# Patient Record
Sex: Female | Born: 1945 | Race: Black or African American | Hispanic: No | Smoking: Former smoker
Health system: Southern US, Community
[De-identification: ages and names within clinical notes are randomized; demographics above are authoritative.]

## PROBLEM LIST (undated history)

## (undated) DIAGNOSIS — R079 Chest pain, unspecified: Secondary | ICD-10-CM

## (undated) DIAGNOSIS — I10 Essential (primary) hypertension: Secondary | ICD-10-CM

## (undated) DIAGNOSIS — M542 Cervicalgia: Secondary | ICD-10-CM

## (undated) DIAGNOSIS — E039 Hypothyroidism, unspecified: Secondary | ICD-10-CM

## (undated) DIAGNOSIS — J189 Pneumonia, unspecified organism: Secondary | ICD-10-CM

## (undated) DIAGNOSIS — T4145XA Adverse effect of unspecified anesthetic, initial encounter: Secondary | ICD-10-CM

## (undated) DIAGNOSIS — R921 Mammographic calcification found on diagnostic imaging of breast: Secondary | ICD-10-CM

## (undated) DIAGNOSIS — J441 Chronic obstructive pulmonary disease with (acute) exacerbation: Secondary | ICD-10-CM

## (undated) DIAGNOSIS — C50919 Malignant neoplasm of unspecified site of unspecified female breast: Secondary | ICD-10-CM

## (undated) DIAGNOSIS — R7303 Prediabetes: Secondary | ICD-10-CM

## (undated) DIAGNOSIS — G709 Myoneural disorder, unspecified: Secondary | ICD-10-CM

## (undated) DIAGNOSIS — R0600 Dyspnea, unspecified: Secondary | ICD-10-CM

## (undated) DIAGNOSIS — J449 Chronic obstructive pulmonary disease, unspecified: Secondary | ICD-10-CM

## (undated) DIAGNOSIS — E559 Vitamin D deficiency, unspecified: Secondary | ICD-10-CM

## (undated) DIAGNOSIS — Z8711 Personal history of peptic ulcer disease: Secondary | ICD-10-CM

## (undated) DIAGNOSIS — J019 Acute sinusitis, unspecified: Secondary | ICD-10-CM

## (undated) DIAGNOSIS — K5730 Diverticulosis of large intestine without perforation or abscess without bleeding: Secondary | ICD-10-CM

## (undated) DIAGNOSIS — M5030 Other cervical disc degeneration, unspecified cervical region: Secondary | ICD-10-CM

## (undated) DIAGNOSIS — L80 Vitiligo: Secondary | ICD-10-CM

## (undated) DIAGNOSIS — G1220 Motor neuron disease, unspecified: Secondary | ICD-10-CM

## (undated) DIAGNOSIS — E785 Hyperlipidemia, unspecified: Secondary | ICD-10-CM

## (undated) DIAGNOSIS — K219 Gastro-esophageal reflux disease without esophagitis: Secondary | ICD-10-CM

## (undated) DIAGNOSIS — K589 Irritable bowel syndrome without diarrhea: Secondary | ICD-10-CM

## (undated) DIAGNOSIS — T8859XA Other complications of anesthesia, initial encounter: Secondary | ICD-10-CM

## (undated) HISTORY — PX: ABDOMINAL HYSTERECTOMY W/ PARTIAL VAGINACTOMY: SUR660

## (undated) HISTORY — DX: Chronic obstructive pulmonary disease, unspecified: J44.9

## (undated) HISTORY — DX: Vitamin D deficiency, unspecified: E55.9

## (undated) HISTORY — DX: Acute sinusitis, unspecified: J01.90

## (undated) HISTORY — DX: Motor neuron disease, unspecified: G12.20

## (undated) HISTORY — DX: Gastro-esophageal reflux disease without esophagitis: K21.9

## (undated) HISTORY — DX: Chronic obstructive pulmonary disease with (acute) exacerbation: J44.1

## (undated) HISTORY — DX: Irritable bowel syndrome, unspecified: K58.9

## (undated) HISTORY — PX: THYROIDECTOMY, PARTIAL: SHX18

## (undated) HISTORY — DX: Essential (primary) hypertension: I10

## (undated) HISTORY — PX: BREAST BIOPSY: SHX20

## (undated) HISTORY — DX: Chest pain, unspecified: R07.9

## (undated) HISTORY — DX: Cervicalgia: M54.2

## (undated) HISTORY — DX: Other cervical disc degeneration, unspecified cervical region: M50.30

## (undated) HISTORY — DX: Myoneural disorder, unspecified: G70.9

## (undated) HISTORY — DX: Personal history of peptic ulcer disease: Z87.11

## (undated) HISTORY — PX: TONSILLECTOMY: SUR1361

## (undated) HISTORY — DX: Vitiligo: L80

## (undated) HISTORY — DX: Diverticulosis of large intestine without perforation or abscess without bleeding: K57.30

## (undated) HISTORY — DX: Hypothyroidism, unspecified: E03.9

## (undated) HISTORY — PX: MULTIPLE TOOTH EXTRACTIONS: SHX2053

## (undated) HISTORY — DX: Hyperlipidemia, unspecified: E78.5

## (undated) HISTORY — DX: Malignant neoplasm of unspecified site of unspecified female breast: C50.919

## (undated) HISTORY — DX: Mammographic calcification found on diagnostic imaging of breast: R92.1

---

## 1996-10-29 HISTORY — PX: BREAST EXCISIONAL BIOPSY: SUR124

## 2000-01-17 ENCOUNTER — Encounter: Admission: RE | Admit: 2000-01-17 | Discharge: 2000-01-17 | Payer: Self-pay | Admitting: Obstetrics and Gynecology

## 2000-01-17 ENCOUNTER — Encounter: Payer: Self-pay | Admitting: Obstetrics and Gynecology

## 2001-01-22 ENCOUNTER — Encounter: Payer: Self-pay | Admitting: Obstetrics and Gynecology

## 2001-01-22 ENCOUNTER — Encounter: Admission: RE | Admit: 2001-01-22 | Discharge: 2001-01-22 | Payer: Self-pay | Admitting: Obstetrics and Gynecology

## 2002-01-08 ENCOUNTER — Encounter: Payer: Self-pay | Admitting: Internal Medicine

## 2002-01-08 ENCOUNTER — Encounter: Admission: RE | Admit: 2002-01-08 | Discharge: 2002-01-08 | Payer: Self-pay | Admitting: Internal Medicine

## 2002-01-26 ENCOUNTER — Ambulatory Visit (HOSPITAL_COMMUNITY): Admission: RE | Admit: 2002-01-26 | Discharge: 2002-01-26 | Payer: Self-pay | Admitting: Internal Medicine

## 2003-03-05 ENCOUNTER — Encounter: Payer: Self-pay | Admitting: Family Medicine

## 2003-03-05 ENCOUNTER — Encounter: Admission: RE | Admit: 2003-03-05 | Discharge: 2003-03-05 | Payer: Self-pay | Admitting: Family Medicine

## 2003-03-11 ENCOUNTER — Encounter: Admission: RE | Admit: 2003-03-11 | Discharge: 2003-03-11 | Payer: Self-pay | Admitting: Family Medicine

## 2003-03-11 ENCOUNTER — Encounter: Payer: Self-pay | Admitting: Family Medicine

## 2003-11-26 ENCOUNTER — Emergency Department (HOSPITAL_COMMUNITY): Admission: AD | Admit: 2003-11-26 | Discharge: 2003-11-26 | Payer: Self-pay | Admitting: Family Medicine

## 2003-12-10 ENCOUNTER — Emergency Department (HOSPITAL_COMMUNITY): Admission: EM | Admit: 2003-12-10 | Discharge: 2003-12-10 | Payer: Self-pay | Admitting: Family Medicine

## 2004-03-21 ENCOUNTER — Encounter: Admission: RE | Admit: 2004-03-21 | Discharge: 2004-03-21 | Payer: Self-pay | Admitting: Family Medicine

## 2004-09-11 ENCOUNTER — Ambulatory Visit: Payer: Self-pay | Admitting: Internal Medicine

## 2004-09-12 ENCOUNTER — Encounter: Admission: RE | Admit: 2004-09-12 | Discharge: 2004-09-12 | Payer: Self-pay | Admitting: Internal Medicine

## 2005-02-28 ENCOUNTER — Other Ambulatory Visit: Admission: RE | Admit: 2005-02-28 | Discharge: 2005-02-28 | Payer: Self-pay | Admitting: Family Medicine

## 2005-02-28 ENCOUNTER — Ambulatory Visit: Payer: Self-pay | Admitting: Family Medicine

## 2005-04-06 ENCOUNTER — Encounter: Admission: RE | Admit: 2005-04-06 | Discharge: 2005-04-06 | Payer: Self-pay | Admitting: Family Medicine

## 2005-06-12 ENCOUNTER — Other Ambulatory Visit: Admission: RE | Admit: 2005-06-12 | Discharge: 2005-06-12 | Payer: Self-pay | Admitting: Family Medicine

## 2005-06-12 ENCOUNTER — Ambulatory Visit: Payer: Self-pay | Admitting: Family Medicine

## 2005-08-09 ENCOUNTER — Other Ambulatory Visit: Admission: RE | Admit: 2005-08-09 | Discharge: 2005-08-09 | Payer: Self-pay | Admitting: Obstetrics and Gynecology

## 2005-11-23 ENCOUNTER — Ambulatory Visit: Payer: Self-pay | Admitting: Family Medicine

## 2006-04-16 ENCOUNTER — Encounter: Admission: RE | Admit: 2006-04-16 | Discharge: 2006-04-16 | Payer: Self-pay | Admitting: Family Medicine

## 2006-04-30 ENCOUNTER — Ambulatory Visit: Payer: Self-pay | Admitting: Family Medicine

## 2006-05-20 ENCOUNTER — Emergency Department (HOSPITAL_COMMUNITY): Admission: EM | Admit: 2006-05-20 | Discharge: 2006-05-20 | Payer: Self-pay | Admitting: Emergency Medicine

## 2006-10-21 ENCOUNTER — Ambulatory Visit: Payer: Self-pay | Admitting: Internal Medicine

## 2006-12-02 ENCOUNTER — Ambulatory Visit: Payer: Self-pay | Admitting: Family Medicine

## 2006-12-02 LAB — CONVERTED CEMR LAB
Alkaline Phosphatase: 61 units/L (ref 39–117)
BUN: 9 mg/dL (ref 6–23)
Basophils Relative: 1.1 % — ABNORMAL HIGH (ref 0.0–1.0)
CO2: 31 meq/L (ref 19–32)
Creatinine, Ser: 0.9 mg/dL (ref 0.4–1.2)
Eosinophils Relative: 0 % (ref 0.0–5.0)
Glucose, Bld: 112 mg/dL — ABNORMAL HIGH (ref 70–99)
HCT: 44.6 % (ref 36.0–46.0)
Hemoglobin: 15.3 g/dL — ABNORMAL HIGH (ref 12.0–15.0)
LDL Cholesterol: 112 mg/dL — ABNORMAL HIGH (ref 0–99)
Monocytes Absolute: 0.5 10*3/uL (ref 0.2–0.7)
Monocytes Relative: 8.6 % (ref 3.0–11.0)
Neutrophils Relative %: 49.2 % (ref 43.0–77.0)
Potassium: 4 meq/L (ref 3.5–5.1)
RDW: 13.3 % (ref 11.5–14.6)
T3, Free: 3.3 pg/mL (ref 2.3–4.2)
Total Bilirubin: 0.6 mg/dL (ref 0.3–1.2)
Total Protein: 7.4 g/dL (ref 6.0–8.3)
VLDL: 15 mg/dL (ref 0–40)
WBC: 5.8 10*3/uL (ref 4.5–10.5)

## 2006-12-30 ENCOUNTER — Ambulatory Visit: Payer: Self-pay | Admitting: Internal Medicine

## 2007-02-05 ENCOUNTER — Ambulatory Visit: Payer: Self-pay | Admitting: Internal Medicine

## 2007-03-17 DIAGNOSIS — G43809 Other migraine, not intractable, without status migrainosus: Secondary | ICD-10-CM | POA: Insufficient documentation

## 2007-03-17 DIAGNOSIS — E039 Hypothyroidism, unspecified: Secondary | ICD-10-CM

## 2007-03-17 DIAGNOSIS — I10 Essential (primary) hypertension: Secondary | ICD-10-CM | POA: Insufficient documentation

## 2007-04-21 ENCOUNTER — Encounter: Admission: RE | Admit: 2007-04-21 | Discharge: 2007-04-21 | Payer: Self-pay | Admitting: Family Medicine

## 2007-04-21 ENCOUNTER — Encounter: Payer: Self-pay | Admitting: Family Medicine

## 2007-04-23 ENCOUNTER — Encounter (INDEPENDENT_AMBULATORY_CARE_PROVIDER_SITE_OTHER): Payer: Self-pay | Admitting: *Deleted

## 2007-04-24 ENCOUNTER — Ambulatory Visit: Payer: Self-pay | Admitting: Family Medicine

## 2007-04-24 ENCOUNTER — Encounter: Admission: RE | Admit: 2007-04-24 | Discharge: 2007-04-24 | Payer: Self-pay | Admitting: Family Medicine

## 2007-04-24 DIAGNOSIS — J441 Chronic obstructive pulmonary disease with (acute) exacerbation: Secondary | ICD-10-CM

## 2007-05-08 ENCOUNTER — Ambulatory Visit: Payer: Self-pay | Admitting: Family Medicine

## 2007-05-20 ENCOUNTER — Encounter: Payer: Self-pay | Admitting: Family Medicine

## 2007-05-22 ENCOUNTER — Ambulatory Visit: Payer: Self-pay | Admitting: Family Medicine

## 2007-05-22 DIAGNOSIS — J449 Chronic obstructive pulmonary disease, unspecified: Secondary | ICD-10-CM

## 2007-06-09 ENCOUNTER — Encounter: Payer: Self-pay | Admitting: Family Medicine

## 2007-06-09 ENCOUNTER — Ambulatory Visit: Payer: Self-pay | Admitting: Internal Medicine

## 2007-06-13 ENCOUNTER — Telehealth (INDEPENDENT_AMBULATORY_CARE_PROVIDER_SITE_OTHER): Payer: Self-pay | Admitting: *Deleted

## 2008-02-09 ENCOUNTER — Ambulatory Visit: Payer: Self-pay | Admitting: Family Medicine

## 2008-02-09 DIAGNOSIS — R079 Chest pain, unspecified: Secondary | ICD-10-CM

## 2008-02-09 DIAGNOSIS — IMO0001 Reserved for inherently not codable concepts without codable children: Secondary | ICD-10-CM | POA: Insufficient documentation

## 2008-02-09 DIAGNOSIS — H9209 Otalgia, unspecified ear: Secondary | ICD-10-CM | POA: Insufficient documentation

## 2008-02-09 DIAGNOSIS — R5383 Other fatigue: Secondary | ICD-10-CM

## 2008-02-09 DIAGNOSIS — R5381 Other malaise: Secondary | ICD-10-CM

## 2008-02-10 ENCOUNTER — Telehealth (INDEPENDENT_AMBULATORY_CARE_PROVIDER_SITE_OTHER): Payer: Self-pay | Admitting: *Deleted

## 2008-02-12 ENCOUNTER — Encounter (INDEPENDENT_AMBULATORY_CARE_PROVIDER_SITE_OTHER): Payer: Self-pay | Admitting: *Deleted

## 2008-02-13 ENCOUNTER — Encounter (INDEPENDENT_AMBULATORY_CARE_PROVIDER_SITE_OTHER): Payer: Self-pay | Admitting: Internal Medicine

## 2008-03-02 ENCOUNTER — Encounter (INDEPENDENT_AMBULATORY_CARE_PROVIDER_SITE_OTHER): Payer: Self-pay | Admitting: *Deleted

## 2008-03-08 ENCOUNTER — Telehealth (INDEPENDENT_AMBULATORY_CARE_PROVIDER_SITE_OTHER): Payer: Self-pay | Admitting: *Deleted

## 2008-03-14 ENCOUNTER — Encounter: Payer: Self-pay | Admitting: Internal Medicine

## 2008-03-24 ENCOUNTER — Ambulatory Visit: Payer: Self-pay | Admitting: Internal Medicine

## 2008-03-24 DIAGNOSIS — J019 Acute sinusitis, unspecified: Secondary | ICD-10-CM | POA: Insufficient documentation

## 2008-03-25 ENCOUNTER — Telehealth (INDEPENDENT_AMBULATORY_CARE_PROVIDER_SITE_OTHER): Payer: Self-pay | Admitting: *Deleted

## 2008-05-17 ENCOUNTER — Telehealth (INDEPENDENT_AMBULATORY_CARE_PROVIDER_SITE_OTHER): Payer: Self-pay | Admitting: *Deleted

## 2008-05-26 ENCOUNTER — Telehealth (INDEPENDENT_AMBULATORY_CARE_PROVIDER_SITE_OTHER): Payer: Self-pay | Admitting: *Deleted

## 2008-05-26 ENCOUNTER — Encounter: Payer: Self-pay | Admitting: Family Medicine

## 2008-05-26 ENCOUNTER — Other Ambulatory Visit: Admission: RE | Admit: 2008-05-26 | Discharge: 2008-05-26 | Payer: Self-pay | Admitting: Family Medicine

## 2008-05-26 ENCOUNTER — Encounter (INDEPENDENT_AMBULATORY_CARE_PROVIDER_SITE_OTHER): Payer: Self-pay | Admitting: *Deleted

## 2008-05-26 ENCOUNTER — Telehealth: Payer: Self-pay | Admitting: Internal Medicine

## 2008-05-26 ENCOUNTER — Ambulatory Visit: Payer: Self-pay | Admitting: Family Medicine

## 2008-05-26 DIAGNOSIS — K219 Gastro-esophageal reflux disease without esophagitis: Secondary | ICD-10-CM | POA: Insufficient documentation

## 2008-05-26 DIAGNOSIS — E559 Vitamin D deficiency, unspecified: Secondary | ICD-10-CM | POA: Insufficient documentation

## 2008-05-26 DIAGNOSIS — Z8711 Personal history of peptic ulcer disease: Secondary | ICD-10-CM

## 2008-05-27 ENCOUNTER — Encounter: Admission: RE | Admit: 2008-05-27 | Discharge: 2008-05-27 | Payer: Self-pay | Admitting: Family Medicine

## 2008-05-27 ENCOUNTER — Encounter (INDEPENDENT_AMBULATORY_CARE_PROVIDER_SITE_OTHER): Payer: Self-pay | Admitting: *Deleted

## 2008-05-27 LAB — CONVERTED CEMR LAB: Vit D, 1,25-Dihydroxy: 16 — ABNORMAL LOW (ref 30–89)

## 2008-05-30 ENCOUNTER — Encounter (INDEPENDENT_AMBULATORY_CARE_PROVIDER_SITE_OTHER): Payer: Self-pay | Admitting: *Deleted

## 2008-05-31 ENCOUNTER — Encounter (INDEPENDENT_AMBULATORY_CARE_PROVIDER_SITE_OTHER): Payer: Self-pay | Admitting: *Deleted

## 2008-06-02 ENCOUNTER — Encounter (INDEPENDENT_AMBULATORY_CARE_PROVIDER_SITE_OTHER): Payer: Self-pay | Admitting: *Deleted

## 2008-06-02 LAB — CONVERTED CEMR LAB
AST: 25 units/L (ref 0–37)
Albumin: 4.4 g/dL (ref 3.5–5.2)
Alkaline Phosphatase: 74 units/L (ref 39–117)
BUN: 6 mg/dL (ref 6–23)
Bilirubin, Direct: 0.1 mg/dL (ref 0.0–0.3)
Chloride: 102 meq/L (ref 96–112)
Eosinophils Absolute: 0 10*3/uL (ref 0.0–0.7)
Eosinophils Relative: 0.1 % (ref 0.0–5.0)
GFR calc non Af Amer: 91 mL/min
HDL: 35.1 mg/dL — ABNORMAL LOW (ref >39.0)
MCV: 96.8 fL (ref 78.0–100.0)
Monocytes Relative: 8.3 % (ref 3.0–12.0)
Neutrophils Relative %: 47.2 % (ref 43.0–77.0)
Platelets: 273 10*3/uL (ref 150–400)
Potassium: 3.9 meq/L (ref 3.5–5.1)
Sodium: 142 meq/L (ref 135–145)
Total CHOL/HDL Ratio: 5
VLDL: 31 mg/dL (ref 0–40)
WBC: 5.8 10*3/uL (ref 4.5–10.5)

## 2008-06-18 ENCOUNTER — Ambulatory Visit: Payer: Self-pay | Admitting: Internal Medicine

## 2008-06-18 DIAGNOSIS — R1084 Generalized abdominal pain: Secondary | ICD-10-CM

## 2008-06-21 ENCOUNTER — Telehealth: Payer: Self-pay | Admitting: Internal Medicine

## 2008-06-25 ENCOUNTER — Telehealth (INDEPENDENT_AMBULATORY_CARE_PROVIDER_SITE_OTHER): Payer: Self-pay | Admitting: *Deleted

## 2008-07-21 ENCOUNTER — Ambulatory Visit: Payer: Self-pay | Admitting: Family Medicine

## 2008-08-31 ENCOUNTER — Encounter (INDEPENDENT_AMBULATORY_CARE_PROVIDER_SITE_OTHER): Payer: Self-pay | Admitting: Internal Medicine

## 2008-08-31 ENCOUNTER — Ambulatory Visit: Payer: Self-pay | Admitting: Nurse Practitioner

## 2008-09-03 ENCOUNTER — Encounter (INDEPENDENT_AMBULATORY_CARE_PROVIDER_SITE_OTHER): Payer: Self-pay | Admitting: Internal Medicine

## 2008-09-05 ENCOUNTER — Encounter (INDEPENDENT_AMBULATORY_CARE_PROVIDER_SITE_OTHER): Payer: Self-pay | Admitting: Family Medicine

## 2008-09-08 ENCOUNTER — Encounter (INDEPENDENT_AMBULATORY_CARE_PROVIDER_SITE_OTHER): Payer: Self-pay | Admitting: Internal Medicine

## 2008-09-16 LAB — CONVERTED CEMR LAB: Vit D, 1,25-Dihydroxy: 31 (ref 30–89)

## 2008-09-28 ENCOUNTER — Encounter (INDEPENDENT_AMBULATORY_CARE_PROVIDER_SITE_OTHER): Payer: Self-pay | Admitting: Internal Medicine

## 2008-09-28 ENCOUNTER — Telehealth (INDEPENDENT_AMBULATORY_CARE_PROVIDER_SITE_OTHER): Payer: Self-pay | Admitting: Internal Medicine

## 2008-10-07 ENCOUNTER — Ambulatory Visit: Payer: Self-pay | Admitting: Internal Medicine

## 2008-10-11 ENCOUNTER — Telehealth (INDEPENDENT_AMBULATORY_CARE_PROVIDER_SITE_OTHER): Payer: Self-pay | Admitting: Internal Medicine

## 2008-10-12 LAB — CONVERTED CEMR LAB: TSH: 1.902 microintl units/mL (ref 0.350–4.50)

## 2008-11-18 ENCOUNTER — Ambulatory Visit: Payer: Self-pay | Admitting: Internal Medicine

## 2008-11-18 DIAGNOSIS — R1313 Dysphagia, pharyngeal phase: Secondary | ICD-10-CM

## 2008-11-18 LAB — CONVERTED CEMR LAB: Rapid Strep: NEGATIVE

## 2008-11-22 ENCOUNTER — Telehealth (INDEPENDENT_AMBULATORY_CARE_PROVIDER_SITE_OTHER): Payer: Self-pay | Admitting: Internal Medicine

## 2008-11-22 ENCOUNTER — Ambulatory Visit: Payer: Self-pay | Admitting: Internal Medicine

## 2008-11-22 LAB — CONVERTED CEMR LAB
CO2: 24 meq/L (ref 19–32)
Calcium: 10.2 mg/dL (ref 8.4–10.5)
Creatinine, Ser: 0.8 mg/dL (ref 0.40–1.20)
Sodium: 143 meq/L (ref 135–145)

## 2008-11-23 ENCOUNTER — Ambulatory Visit (HOSPITAL_COMMUNITY): Admission: RE | Admit: 2008-11-23 | Discharge: 2008-11-23 | Payer: Self-pay | Admitting: Internal Medicine

## 2008-12-06 ENCOUNTER — Encounter (INDEPENDENT_AMBULATORY_CARE_PROVIDER_SITE_OTHER): Payer: Self-pay | Admitting: Internal Medicine

## 2008-12-09 ENCOUNTER — Telehealth (INDEPENDENT_AMBULATORY_CARE_PROVIDER_SITE_OTHER): Payer: Self-pay | Admitting: Internal Medicine

## 2009-01-06 ENCOUNTER — Ambulatory Visit: Payer: Self-pay | Admitting: Internal Medicine

## 2009-01-06 DIAGNOSIS — M542 Cervicalgia: Secondary | ICD-10-CM

## 2009-01-13 ENCOUNTER — Ambulatory Visit (HOSPITAL_COMMUNITY): Admission: RE | Admit: 2009-01-13 | Discharge: 2009-01-13 | Payer: Self-pay | Admitting: Internal Medicine

## 2009-01-16 ENCOUNTER — Encounter (INDEPENDENT_AMBULATORY_CARE_PROVIDER_SITE_OTHER): Payer: Self-pay | Admitting: Internal Medicine

## 2009-01-16 DIAGNOSIS — M5030 Other cervical disc degeneration, unspecified cervical region: Secondary | ICD-10-CM

## 2009-01-18 ENCOUNTER — Telehealth (INDEPENDENT_AMBULATORY_CARE_PROVIDER_SITE_OTHER): Payer: Self-pay | Admitting: Internal Medicine

## 2009-01-25 ENCOUNTER — Telehealth (INDEPENDENT_AMBULATORY_CARE_PROVIDER_SITE_OTHER): Payer: Self-pay | Admitting: Internal Medicine

## 2009-01-27 ENCOUNTER — Encounter (INDEPENDENT_AMBULATORY_CARE_PROVIDER_SITE_OTHER): Payer: Self-pay | Admitting: Internal Medicine

## 2009-02-07 ENCOUNTER — Telehealth (INDEPENDENT_AMBULATORY_CARE_PROVIDER_SITE_OTHER): Payer: Self-pay | Admitting: Internal Medicine

## 2009-02-07 ENCOUNTER — Encounter: Admission: RE | Admit: 2009-02-07 | Discharge: 2009-05-08 | Payer: Self-pay | Admitting: Internal Medicine

## 2009-02-08 ENCOUNTER — Encounter (INDEPENDENT_AMBULATORY_CARE_PROVIDER_SITE_OTHER): Payer: Self-pay | Admitting: Internal Medicine

## 2009-02-11 ENCOUNTER — Encounter (INDEPENDENT_AMBULATORY_CARE_PROVIDER_SITE_OTHER): Payer: Self-pay | Admitting: Internal Medicine

## 2009-02-16 ENCOUNTER — Ambulatory Visit (HOSPITAL_COMMUNITY): Admission: RE | Admit: 2009-02-16 | Discharge: 2009-02-16 | Payer: Self-pay | Admitting: Internal Medicine

## 2009-02-16 ENCOUNTER — Encounter (INDEPENDENT_AMBULATORY_CARE_PROVIDER_SITE_OTHER): Payer: Self-pay | Admitting: Internal Medicine

## 2009-02-17 ENCOUNTER — Encounter (INDEPENDENT_AMBULATORY_CARE_PROVIDER_SITE_OTHER): Payer: Self-pay | Admitting: Internal Medicine

## 2009-02-18 ENCOUNTER — Ambulatory Visit: Payer: Self-pay | Admitting: Internal Medicine

## 2009-02-20 ENCOUNTER — Encounter (INDEPENDENT_AMBULATORY_CARE_PROVIDER_SITE_OTHER): Payer: Self-pay | Admitting: Internal Medicine

## 2009-02-20 LAB — CONVERTED CEMR LAB: TSH: 4.297 microintl units/mL (ref 0.350–4.500)

## 2009-03-14 ENCOUNTER — Encounter (INDEPENDENT_AMBULATORY_CARE_PROVIDER_SITE_OTHER): Payer: Self-pay | Admitting: Internal Medicine

## 2009-04-12 ENCOUNTER — Encounter (INDEPENDENT_AMBULATORY_CARE_PROVIDER_SITE_OTHER): Payer: Self-pay | Admitting: Internal Medicine

## 2009-04-15 ENCOUNTER — Encounter (INDEPENDENT_AMBULATORY_CARE_PROVIDER_SITE_OTHER): Payer: Self-pay | Admitting: Internal Medicine

## 2009-04-19 ENCOUNTER — Ambulatory Visit: Payer: Self-pay | Admitting: Internal Medicine

## 2009-05-18 ENCOUNTER — Ambulatory Visit: Payer: Self-pay | Admitting: Family Medicine

## 2009-05-18 DIAGNOSIS — L80 Vitiligo: Secondary | ICD-10-CM

## 2009-05-23 ENCOUNTER — Telehealth (INDEPENDENT_AMBULATORY_CARE_PROVIDER_SITE_OTHER): Payer: Self-pay | Admitting: Internal Medicine

## 2009-06-10 ENCOUNTER — Telehealth (INDEPENDENT_AMBULATORY_CARE_PROVIDER_SITE_OTHER): Payer: Self-pay | Admitting: Internal Medicine

## 2009-07-15 ENCOUNTER — Ambulatory Visit: Payer: Self-pay | Admitting: Internal Medicine

## 2009-07-15 LAB — CONVERTED CEMR LAB: TSH: 1.461 microintl units/mL (ref 0.350–4.500)

## 2009-07-19 ENCOUNTER — Encounter (INDEPENDENT_AMBULATORY_CARE_PROVIDER_SITE_OTHER): Payer: Self-pay | Admitting: Internal Medicine

## 2009-07-19 ENCOUNTER — Ambulatory Visit: Payer: Self-pay | Admitting: Internal Medicine

## 2009-07-24 ENCOUNTER — Encounter (INDEPENDENT_AMBULATORY_CARE_PROVIDER_SITE_OTHER): Payer: Self-pay | Admitting: Internal Medicine

## 2009-08-15 ENCOUNTER — Telehealth (INDEPENDENT_AMBULATORY_CARE_PROVIDER_SITE_OTHER): Payer: Self-pay | Admitting: Internal Medicine

## 2009-08-19 ENCOUNTER — Telehealth (INDEPENDENT_AMBULATORY_CARE_PROVIDER_SITE_OTHER): Payer: Self-pay | Admitting: Internal Medicine

## 2009-08-19 ENCOUNTER — Ambulatory Visit: Payer: Self-pay | Admitting: Internal Medicine

## 2009-08-19 DIAGNOSIS — J309 Allergic rhinitis, unspecified: Secondary | ICD-10-CM | POA: Insufficient documentation

## 2009-08-19 DIAGNOSIS — E782 Mixed hyperlipidemia: Secondary | ICD-10-CM | POA: Insufficient documentation

## 2009-08-19 LAB — CONVERTED CEMR LAB
ALT: 18 units/L (ref 0–35)
Albumin: 4.6 g/dL (ref 3.5–5.2)
Alkaline Phosphatase: 69 units/L (ref 39–117)
Basophils Absolute: 0 10*3/uL (ref 0.0–0.1)
Basophils Relative: 1 % (ref 0–1)
Bilirubin Urine: NEGATIVE
CO2: 26 meq/L (ref 19–32)
Glucose, Bld: 99 mg/dL (ref 70–99)
Glucose, Urine, Semiquant: NEGATIVE
Hemoglobin: 15 g/dL (ref 12.0–15.0)
LDL Cholesterol: 139 mg/dL — ABNORMAL HIGH (ref 0–99)
Lymphocytes Relative: 51 % — ABNORMAL HIGH (ref 12–46)
Monocytes Absolute: 0.6 10*3/uL (ref 0.1–1.0)
Monocytes Relative: 12 % (ref 3–12)
Neutro Abs: 2 10*3/uL (ref 1.7–7.7)
Neutrophils Relative %: 37 % — ABNORMAL LOW (ref 43–77)
Potassium: 4.8 meq/L (ref 3.5–5.3)
RBC: 4.79 M/uL (ref 3.87–5.11)
Sodium: 142 meq/L (ref 135–145)
Specific Gravity, Urine: 1.015
Total Bilirubin: 0.6 mg/dL (ref 0.3–1.2)
Total Protein: 7.6 g/dL (ref 6.0–8.3)
Triglycerides: 237 mg/dL — ABNORMAL HIGH (ref <150)
VLDL: 47 mg/dL — ABNORMAL HIGH (ref 0–40)
WBC Urine, dipstick: NEGATIVE
pH: 6

## 2009-08-24 ENCOUNTER — Ambulatory Visit (HOSPITAL_COMMUNITY): Admission: RE | Admit: 2009-08-24 | Discharge: 2009-08-24 | Payer: Self-pay | Admitting: Internal Medicine

## 2009-08-31 ENCOUNTER — Encounter (INDEPENDENT_AMBULATORY_CARE_PROVIDER_SITE_OTHER): Payer: Self-pay | Admitting: Internal Medicine

## 2009-09-14 ENCOUNTER — Telehealth (INDEPENDENT_AMBULATORY_CARE_PROVIDER_SITE_OTHER): Payer: Self-pay | Admitting: Internal Medicine

## 2009-09-27 ENCOUNTER — Ambulatory Visit: Payer: Self-pay | Admitting: Internal Medicine

## 2009-09-30 ENCOUNTER — Ambulatory Visit (HOSPITAL_COMMUNITY): Admission: RE | Admit: 2009-09-30 | Discharge: 2009-09-30 | Payer: Self-pay | Admitting: Internal Medicine

## 2009-10-17 ENCOUNTER — Encounter (INDEPENDENT_AMBULATORY_CARE_PROVIDER_SITE_OTHER): Payer: Self-pay | Admitting: Internal Medicine

## 2009-10-17 LAB — CONVERTED CEMR LAB
ALT: 22 units/L (ref 0–35)
Albumin: 4.9 g/dL (ref 3.5–5.2)
Amylase: 78 units/L (ref 0–105)
Basophils Relative: 1 % (ref 0–1)
CO2: 28 meq/L (ref 19–32)
Chloride: 100 meq/L (ref 96–112)
Glucose, Bld: 110 mg/dL — ABNORMAL HIGH (ref 70–99)
Hemoglobin: 14.8 g/dL (ref 12.0–15.0)
Lipase: 26 units/L (ref 0–75)
Lymphocytes Relative: 46 % (ref 12–46)
Lymphs Abs: 2.9 10*3/uL (ref 0.7–4.0)
MCHC: 33.2 g/dL (ref 30.0–36.0)
Monocytes Absolute: 0.6 10*3/uL (ref 0.1–1.0)
Monocytes Relative: 9 % (ref 3–12)
Neutro Abs: 2.9 10*3/uL (ref 1.7–7.7)
Neutrophils Relative %: 45 % (ref 43–77)
Potassium: 4.3 meq/L (ref 3.5–5.3)
RBC: 4.66 M/uL (ref 3.87–5.11)
Sodium: 141 meq/L (ref 135–145)
Total Bilirubin: 0.5 mg/dL (ref 0.3–1.2)
Total Protein: 7.8 g/dL (ref 6.0–8.3)
WBC: 6.4 10*3/uL (ref 4.0–10.5)

## 2009-11-11 ENCOUNTER — Ambulatory Visit: Payer: Self-pay | Admitting: Internal Medicine

## 2009-11-11 DIAGNOSIS — E119 Type 2 diabetes mellitus without complications: Secondary | ICD-10-CM

## 2009-12-22 ENCOUNTER — Ambulatory Visit: Payer: Self-pay | Admitting: Internal Medicine

## 2009-12-22 DIAGNOSIS — R634 Abnormal weight loss: Secondary | ICD-10-CM

## 2009-12-22 LAB — CONVERTED CEMR LAB: Blood Glucose, Fingerstick: 93

## 2009-12-31 ENCOUNTER — Encounter (INDEPENDENT_AMBULATORY_CARE_PROVIDER_SITE_OTHER): Payer: Self-pay | Admitting: Internal Medicine

## 2009-12-31 LAB — CONVERTED CEMR LAB
Albumin: 4.8 g/dL (ref 3.5–5.2)
BUN: 8 mg/dL (ref 6–23)
Calcium: 10.1 mg/dL (ref 8.4–10.5)
Chloride: 104 meq/L (ref 96–112)
Cholesterol: 216 mg/dL — ABNORMAL HIGH (ref 0–200)
Creatinine, Ser: 0.73 mg/dL (ref 0.40–1.20)
Eosinophils Absolute: 0 10*3/uL (ref 0.0–0.7)
Eosinophils Relative: 0 % (ref 0–5)
Glucose, Bld: 109 mg/dL — ABNORMAL HIGH (ref 70–99)
HCT: 45.2 % (ref 36.0–46.0)
HDL: 41 mg/dL (ref >39)
Hemoglobin: 14.3 g/dL (ref 12.0–15.0)
Lymphocytes Relative: 54 % — ABNORMAL HIGH (ref 12–46)
Lymphs Abs: 3 10*3/uL (ref 0.7–4.0)
MCV: 96 fL (ref 78.0–100.0)
Monocytes Absolute: 0.6 10*3/uL (ref 0.1–1.0)
Potassium: 4.5 meq/L (ref 3.5–5.3)
RDW: 13.9 % (ref 11.5–15.5)
Triglycerides: 180 mg/dL — ABNORMAL HIGH (ref <150)

## 2010-02-21 ENCOUNTER — Ambulatory Visit: Payer: Self-pay | Admitting: Internal Medicine

## 2010-02-21 DIAGNOSIS — M79609 Pain in unspecified limb: Secondary | ICD-10-CM | POA: Insufficient documentation

## 2010-02-21 DIAGNOSIS — G609 Hereditary and idiopathic neuropathy, unspecified: Secondary | ICD-10-CM | POA: Insufficient documentation

## 2010-02-21 LAB — CONVERTED CEMR LAB
Blood Glucose, Fingerstick: 96
Hgb A1c MFr Bld: 6.4 %

## 2010-03-10 ENCOUNTER — Ambulatory Visit: Payer: Self-pay | Admitting: Internal Medicine

## 2010-03-20 LAB — CONVERTED CEMR LAB
Free T4: 1.42 ng/dL (ref 0.80–1.80)
TSH: 0.249 microintl units/mL — ABNORMAL LOW (ref 0.350–4.500)
Vitamin B-12: 725 pg/mL (ref 211–911)

## 2010-04-20 ENCOUNTER — Ambulatory Visit: Payer: Self-pay | Admitting: Internal Medicine

## 2010-04-27 LAB — CONVERTED CEMR LAB
Bilirubin, Direct: 0.1 mg/dL (ref 0.0–0.3)
Indirect Bilirubin: 0.4 mg/dL (ref 0.0–0.9)
LDL Cholesterol: 144 mg/dL — ABNORMAL HIGH (ref 0–99)
Total Bilirubin: 0.5 mg/dL (ref 0.3–1.2)
Total Protein: 7.4 g/dL (ref 6.0–8.3)
Triglycerides: 143 mg/dL (ref <150)
VLDL: 29 mg/dL (ref 0–40)

## 2010-06-12 ENCOUNTER — Telehealth (INDEPENDENT_AMBULATORY_CARE_PROVIDER_SITE_OTHER): Payer: Self-pay | Admitting: Internal Medicine

## 2010-06-14 ENCOUNTER — Telehealth (INDEPENDENT_AMBULATORY_CARE_PROVIDER_SITE_OTHER): Payer: Self-pay | Admitting: Internal Medicine

## 2010-06-23 ENCOUNTER — Ambulatory Visit: Payer: Self-pay | Admitting: Internal Medicine

## 2010-06-23 ENCOUNTER — Telehealth (INDEPENDENT_AMBULATORY_CARE_PROVIDER_SITE_OTHER): Payer: Self-pay | Admitting: *Deleted

## 2010-06-26 ENCOUNTER — Ambulatory Visit: Payer: Self-pay | Admitting: Internal Medicine

## 2010-06-26 LAB — CONVERTED CEMR LAB: Total CK: 389 units/L — ABNORMAL HIGH (ref 7–177)

## 2010-07-05 ENCOUNTER — Ambulatory Visit: Payer: Self-pay | Admitting: Internal Medicine

## 2010-07-21 ENCOUNTER — Encounter (HOSPITAL_COMMUNITY)
Admission: RE | Admit: 2010-07-21 | Discharge: 2010-10-02 | Payer: Self-pay | Source: Home / Self Care | Admitting: Cardiology

## 2010-07-25 ENCOUNTER — Encounter (INDEPENDENT_AMBULATORY_CARE_PROVIDER_SITE_OTHER): Payer: Self-pay | Admitting: Internal Medicine

## 2010-08-30 ENCOUNTER — Ambulatory Visit: Payer: Self-pay | Admitting: Internal Medicine

## 2010-09-14 ENCOUNTER — Ambulatory Visit: Payer: Self-pay | Admitting: Internal Medicine

## 2010-09-14 ENCOUNTER — Telehealth (INDEPENDENT_AMBULATORY_CARE_PROVIDER_SITE_OTHER): Payer: Self-pay | Admitting: Internal Medicine

## 2010-09-14 LAB — CONVERTED CEMR LAB
AST: 21 units/L (ref 0–37)
Alkaline Phosphatase: 86 units/L (ref 39–117)
Bilirubin, Direct: 0.2 mg/dL (ref 0.0–0.3)
Total Bilirubin: 0.9 mg/dL (ref 0.3–1.2)

## 2010-09-27 ENCOUNTER — Ambulatory Visit (HOSPITAL_COMMUNITY)
Admission: RE | Admit: 2010-09-27 | Discharge: 2010-09-27 | Payer: Self-pay | Source: Home / Self Care | Admitting: Internal Medicine

## 2010-10-17 ENCOUNTER — Encounter (INDEPENDENT_AMBULATORY_CARE_PROVIDER_SITE_OTHER): Payer: Self-pay | Admitting: Internal Medicine

## 2010-11-01 ENCOUNTER — Ambulatory Visit: Admit: 2010-11-01 | Payer: Self-pay | Admitting: Internal Medicine

## 2010-11-26 LAB — CONVERTED CEMR LAB
AST: 20 units/L (ref 0–37)
Albumin: 4.7 g/dL (ref 3.5–5.2)
Alkaline Phosphatase: 72 units/L (ref 39–117)
BUN: 10 mg/dL (ref 6–23)
Blood Glucose, Fingerstick: 102
CO2: 30 meq/L (ref 19–32)
Chloride: 104 meq/L (ref 96–112)
Creatinine, Ser: 0.9 mg/dL (ref 0.4–1.2)
Eosinophils Relative: 0.2 % (ref 0.0–5.0)
Glucose, Bld: 104 mg/dL — ABNORMAL HIGH (ref 70–99)
HCT: 45.3 % (ref 36.0–46.0)
Hemoglobin: 14.9 g/dL (ref 12.0–15.0)
Hgb A1c MFr Bld: 5.3 %
MCHC: 32.7 g/dL (ref 30.0–36.0)
Monocytes Absolute: 0.3 10*3/uL (ref 0.1–1.0)
Monocytes Relative: 9.9 % (ref 3.0–12.0)
Neutro Abs: 0.2 10*3/uL — ABNORMAL LOW (ref 1.4–7.7)
Potassium: 4.3 meq/L (ref 3.5–5.3)
RBC: 4.49 M/uL (ref 3.87–5.11)
Sodium: 140 meq/L (ref 135–145)
Total Protein: 7.6 g/dL (ref 6.0–8.3)
Vit D, 1,25-Dihydroxy: 16 — ABNORMAL LOW (ref 30–89)
WBC: 3 10*3/uL — ABNORMAL LOW (ref 4.5–10.5)

## 2010-11-28 NOTE — Letter (Signed)
Summary: TEST ORDER FORM//ULTRASOUND//ABDOMINAL PAIN  TEST ORDER FORM//ULTRASOUND//ABDOMINAL PAIN   Imported By: Arta Bruce 11/18/2009 12:05:15  _____________________________________________________________________  External Attachment:    Type:   Image     Comment:   External Document

## 2010-11-28 NOTE — Assessment & Plan Note (Signed)
  Nurse Visit   Allergies: No Known Drug Allergies  Orders Added: 1)  Est. Patient Level I [[address]] 2)  T-Lipid Profile [80061-22930] 3)  T-Hepatic Function [60454-09811]

## 2010-11-28 NOTE — Letter (Signed)
Summary: *Referral Letter  [facility]  [address] [place], [place] [address]   Phone: [phone]  Fax: [phone]    06/23/2010  Thank you in advance for agreeing to see my patient:  Kristin [name] [address] [place], [place]  [address]  Phone: [phone]  Reason for Referral: Atypical chest pain in woman with multiple risk factors including:  Diet controlled DM--recent A1C 5.3%, mild hyperlipidemia, recently not at goal and switched to Lipitor, controlled hypertension, smoker, family history of CAD--mother died age 31 of MI/stroke.  Symptoms at rest only and associated with GI symptoms as well as left arm numbness and dyspnea.  Pt. also with left cervical radiculopathy, hx of PUD and GERD.    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) 7)  HYPERLIPIDEMIA, MIXED (ICD-272.2) 8)  ENCOUNTER FOR LONG-TERM USE OF OTHER MEDICATIONS (ICD-V58.69) 9)  ROUTINE GYNECOLOGICAL EXAMINATION (ICD-V72.31) 10)  ALLERGIC RHINITIS (ICD-477.9) 11)  VITILIGO (ICD-709.01) 12)  DEGENERATIVE DISC DISEASE, CERVICAL SPINE (ICD-722.4) 13)  NECK PAIN, LEFT (ICD-723.1) 14)  DYSPHAGIA PHARYNGEAL PHASE (ICD-787.23) 15)  ABDOMINAL PAIN-GENERALIZED (ICD-789.07) 16)  UNSPECIFIED VITAMIN D DEFICIENCY (ICD-268.9) 17)  PREVENTIVE HEALTH CARE (ICD-V70.0) 18)  GERD (ICD-530.81) 19)  PUD, HX OF (ICD-V12.71) 20)  SINUSITIS, ACUTE (ICD-461.9) 21)  CHEST PAIN UNSPECIFIED (ICD-786.50) 22)  FATIGUE (ICD-780.79) 23)  MYALGIA (ICD-729.1) 24)  EAR PAIN, LEFT (ICD-388.70) 25)  COPD (ICD-496) 26)  BRONCHITIS, OBSTRUCTIVE CHRONIC W/EXACRB (ICD-491.21) 27)  Family Hx of AMI NOS,  UNSPECIFIED (ICD-410.90) 28)  FAMILY HISTORY DIABETES 1ST DEGREE RELATIVE (ICD-V18.0) 29)  OCULAR MIGRAINE (ICD-346.80) 30)  HYPOTHYROIDISM (ICD-244.9) 31)  HYPERTENSION (ICD-401.9)   Current Medications: 1)  HYDROCHLOROTHIAZIDE 25 MG TABS (HYDROCHLOROTHIAZIDE) take one tablet daily 2)  PROVENTIL HFA 108 (90 BASE) MCG/ACT AERS (ALBUTEROL SULFATE) 1-2 puffs every 4-6 hours as needed for shortness of breath 3)  SPIRIVA HANDIHALER 18 MCG CAPS (TIOTROPIUM BROMIDE MONOHYDRATE) 1 inhalation daily 4)  VITAMIN D 400 UNIT TABS (CHOLECALCIFEROL) 2 tabs by mouth daily 5)  SYNTHROID 100 MCG TABS (LEVOTHYROXINE SODIUM) 1 tab by mouth daily 6)  BAYER ASPIRIN 325 MG TABS (ASPIRIN) take one a day as needed 7)  ACCUPRIL 20 MG TABS (QUINAPRIL HCL) 1/2 tab by mouth daily--finishing 5 mg Enalapril first. 8)  NASACORT AQ 55 MCG/ACT AERS (TRIAMCINOLONE ACETONIDE(NASAL)) 2 sprays each nostril daily 9)  RANITIDINE HCL 150 MG TABS (RANITIDINE HCL) 1 tab by mouth two times a day 10)  XYZAL 5 MG TABS (LEVOCETIRIZINE DIHYDROCHLORIDE) 1 tab by mouth daily as needed allergies 11)  LIPITOR 10 MG TABS (ATORVASTATIN CALCIUM) 1 tab by mouth daily   Past Medical History: 1)  VITILIGO (ICD-709.01) 2)  DEGENERATIVE DISC DISEASE, CERVICAL SPINE (ICD-722.4) 3)  NECK PAIN, LEFT (ICD-723.1) 4)  DYSPHAGIA PHARYNGEAL PHASE (ICD-787.23) 5)  ABDOMINAL PAIN-GENERALIZED (ICD-789.07) 6)  UNSPECIFIED VITAMIN D DEFICIENCY (ICD-268.9) 7)  PREVENTIVE HEALTH CARE (ICD-V70.0) 8)  GERD (ICD-530.81) 9)  PUD, HX OF (ICD-V12.71) 10)  SINUSITIS, ACUTE (ICD-461.9) 11)  CHEST PAIN UNSPECIFIED (ICD-786.50) 12)  FATIGUE (ICD-780.79) 13)  MYALGIA (ICD-729.1) 14)  EAR PAIN, LEFT (ICD-388.70) 15)  COPD (ICD-496) 16)  BRONCHITIS, OBSTRUCTIVE CHRONIC W/EXACRB (ICD-491.21) 17)  Family Hx of AMI NOS, UNSPECIFIED (ICD-410.90) 18)  FAMILY HISTORY DIABETES  1ST DEGREE RELATIVE (ICD-V18.0) 19)  OCULAR MIGRAINE (ICD-346.80) 20)  HYPOTHYROIDISM  (ICD-244.9) 21)  HYPERTENSION (ICD-401.9)   Prior History of Blood Transfusions:   Pertinent Labs:    Thank you again for agreeing to see our patient; please contact us if you have any further questions or need additional information.  Sincerely,  Julieanne Manson MD

## 2010-11-28 NOTE — Progress Notes (Signed)
Summary: cardiology referral   Phone Note Outgoing Call   Summary of Call: Arna Medici:  cardiology referral for atypical chest pain--see letter, EKG, office note and order. Initial call taken by: Julieanne Manson MD,  June 23, 2010 9:38 AM  Follow-up for Phone Call        PT HAVE AN APPT 06-28-10 @ 1;30PM ARRIVAL @ 1PM [facility]  Follow-up by: Cheryll Dessert,  June 26, 2010 9:54 AM

## 2010-11-28 NOTE — Letter (Signed)
Summary: Lipid Letter  [facility]  [address] [place], [place] [address]   Phone: [phone]  Fax: [phone]    12/31/2009  Kristin [name] [address] [place], [place]  [address]  Dear [name]:  We have carefully reviewed your last lipid profile from 12/22/2009 and the results are noted below with a summary of recommendations for lipid management.    Cholesterol:       216     Goal: <200   HDL "good" Cholesterol:   41     Goal: >45   LDL "bad" Cholesterol:   139     Goal: <70   Triglycerides:       180     Goal: <150    Your cholesterol is not at goal for someone with diabetes.   When you follow up in about 1 1/2 months, will need to discuss whether you should start on meds for cholesterol and diabetes.  I will need to discuss your risks and benefits of medication--especially with your liver disease (hepatitis)  You do still make some insulin, so we can use pills to treat Diabetes.      TLC Diet (Therapeutic Lifestyle Change): Saturated Fats & Transfatty acids should be kept < 7% of total calories ***Reduce Saturated Fats Polyunstaurated Fat can be up to 10% of total calories Monounsaturated Fat Fat can be up to 20% of total calories Total Fat should be no greater than 25-35% of total calories Carbohydrates should be 50-60% of total calories Protein should be approximately 15% of total calories Fiber should be at least 20-30 grams a day ***Increased fiber may help lower LDL Total Cholesterol should be < 200mg /day Consider adding plant stanol/sterols to diet (example: Benacol spread) ***A higher intake of unsaturated fat may reduce Triglycerides and Increase HDL    Adjunctive Measures (may lower LIPIDS and reduce risk of Heart Attack) include: Aerobic Exercise (20-30 minutes 3-4 times a week) Limit Alcohol Consumption Weight Reduction Aspirin 75-81 mg a day by mouth (if not allergic or contraindicated) Dietary Fiber 20-30 grams a day by  mouth     Current Medications: 1)    Hydrochlorothiazide 25 Mg Tabs (Hydrochlorothiazide) .... Take one tablet daily 2)    Proventil Hfa 108 (90 Base) Mcg/act Aers (Albuterol sulfate) .Marland Kitchen.. 1-2 puffs every 4-6 hours as needed for shortness of breath 3)    Spiriva Handihaler 18 Mcg Caps (Tiotropium bromide monohydrate) .Marland Kitchen.. 1 inhalation daily 4)    Vitamin D 400 Unit Tabs (Cholecalciferol) .... 2 tabs by mouth daily 5)    Synthroid 100 Mcg Tabs (Levothyroxine sodium) .Marland Kitchen.. 1 tab by mouth daily 6)    Bayer Aspirin 325 Mg Tabs (Aspirin) .... Take one a day as needed 7)    Accupril 20 Mg Tabs (Quinapril hcl) .... 1/2 tab by mouth daily--finishing 5 mg enalapril first. 8)    Nasacort Aq 55 Mcg/act Aers (Triamcinolone acetonide(nasal)) .... 2 sprays each nostril daily 9)    Allegra 180 Mg Tabs (Fexofenadine hcl) .Marland Kitchen.. 1 tab by mouth daily as needed allergies 10)    Ranitidine Hcl 150 Mg Tabs (Ranitidine hcl) .Marland Kitchen.. 1 tab by mouth two times a day  If you have any questions, please call. We appreciate being able to work with you.   Sincerely,    [facility] Julieanne Manson MD

## 2010-11-28 NOTE — Assessment & Plan Note (Signed)
Summary: 2 MO F/U & FLU SHOT PER [name]///RJE   Vital Signs:  Patient profile:   65 year old female Menstrual status:  hysterectomy Height:      66 inches Weight:      136 pounds BMI:     22.03 Temp:     97.0 degrees F oral Pulse rate:   86 / minute Pulse rhythm:   regular Resp:     18 per minute BP sitting:   110 / 60  (left arm) Cuff size:   regular  Vitals Entered By: [place] Shannon (August 30, 2010 10:04 AM) CC: two month f/u... pt needs refill on hctz...  Does patient need assistance? Functional Status Self care Ambulation Normal   Primary Care Izan Miron:  [place]  CC:  two month f/u... pt needs refill on hctz....  History of Present Illness: 1.  Chest discomfort with elevated CPK MB.  Was seen by Dr. Anne Fu, [facility].  Underwent nuclear stress testing which showed an EF of 77% and low risk for ischemia.  No further problems with chest discomfort.  2.  Hyperlipidemia:  Has tolerated Lipitor fine.  Nonfasting today, however.  3.  DM:  Did get her Retasure--have not yet seen the report.  Needs flu vaccine today.  Not checking sugars.  Last A1C was quite good.  4.  Health Maintenance:  Needs Mammogram set up.  Current Medications (verified): 1)  Hydrochlorothiazide 25 Mg Tabs (Hydrochlorothiazide) .... Take One Tablet Daily 2)  Proventil Hfa 108 (90 Base) Mcg/act Aers (Albuterol Sulfate) .Marland Kitchen.. 1-2 Puffs Every 4-6 Hours As Needed For Shortness of Breath 3)  Spiriva Handihaler 18 Mcg Caps (Tiotropium Bromide Monohydrate) .Marland Kitchen.. 1 Inhalation Daily 4)  Vitamin D 400 Unit Tabs (Cholecalciferol) .... 2 Tabs By Mouth Daily 5)  Synthroid 100 Mcg Tabs (Levothyroxine Sodium) .Marland Kitchen.. 1 Tab By Mouth Daily 6)  Bayer Aspirin 325 Mg Tabs (Aspirin) .... Take One A Day As Needed 7)  Accupril 20 Mg Tabs (Quinapril Hcl) .... 1/2 Tab By Mouth Daily--Finishing 5 Mg Enalapril First. 8)  Nasacort Aq 55 Mcg/act Aers (Triamcinolone Acetonide(Nasal)) .... 2 Sprays Each Nostril Daily 9)   Famotidine 40 Mg Tabs (Famotidine) .Marland Kitchen.. 1 Tab By Mouth Daily At Bedtime 10)  Xyzal 5 Mg Tabs (Levocetirizine Dihydrochloride) .Marland Kitchen.. 1 Tab By Mouth Daily As Needed Allergies 11)  Lipitor 10 Mg Tabs (Atorvastatin Calcium) .Marland Kitchen.. 1 Tab By Mouth Daily  Allergies (verified): No Known Drug Allergies  Physical Exam  General:  NAD Lungs:  Normal respiratory effort, chest expands symmetrically. Lungs are clear to auscultation, no crackles or wheezes. Heart:  Normal rate and regular rhythm. S1 and S2 normal without gallop, murmur, click, rub or other extra sounds.  Radial pulses normal and equal   Impression & Recommendations:  Problem # 1:  CHEST PAIN UNSPECIFIED (ICD-786.50) Resolved. Cardiolite was negative for ischemia  Problem # 2:  DIABETES MELLITUS, TYPE II, CONTROLLED (ICD-250.00) Flu vaccine today Check on Retasure Her updated medication list for this problem includes:    Bayer Aspirin 325 Mg Tabs (Aspirin) .Marland Kitchen... Take one a day as needed    Accupril 20 Mg Tabs (Quinapril hcl) .Marland Kitchen... 1/2 tab by mouth daily--finishing 5 mg enalapril first.  Problem # 3:  HYPERLIPIDEMIA, MIXED (ICD-272.2) Return for check of cholesterol on Lipitor Her updated medication list for this problem includes:    Lipitor 10 Mg Tabs (Atorvastatin calcium) .Marland Kitchen... 1 tab by mouth daily  Problem # 4:  PREVENTIVE HEALTH CARE (ICD-V70.0) Flu vaccine  Orders: Mammogram (Screening) (Mammo)  Complete Medication List: 1)  Hydrochlorothiazide 25 Mg Tabs (Hydrochlorothiazide) .... Take one tablet daily 2)  Proventil Hfa 108 (90 Base) Mcg/act Aers (Albuterol sulfate) .Marland Kitchen.. 1-2 puffs every 4-6 hours as needed for shortness of breath 3)  Spiriva Handihaler 18 Mcg Caps (Tiotropium bromide monohydrate) .Marland Kitchen.. 1 inhalation daily 4)  Vitamin D 400 Unit Tabs (Cholecalciferol) .... 2 tabs by mouth daily 5)  Synthroid 100 Mcg Tabs (Levothyroxine sodium) .Marland Kitchen.. 1 tab by mouth daily 6)  Bayer Aspirin 325 Mg Tabs (Aspirin) .... Take one a  day as needed 7)  Accupril 20 Mg Tabs (Quinapril hcl) .... 1/2 tab by mouth daily--finishing 5 mg enalapril first. 8)  Nasacort Aq 55 Mcg/act Aers (Triamcinolone acetonide(nasal)) .... 2 sprays each nostril daily 9)  Famotidine 40 Mg Tabs (Famotidine) .Marland Kitchen.. 1 tab by mouth daily at bedtime 10)  Xyzal 5 Mg Tabs (Levocetirizine dihydrochloride) .Marland Kitchen.. 1 tab by mouth daily as needed allergies 11)  Lipitor 10 Mg Tabs (Atorvastatin calcium) .Marland Kitchen.. 1 tab by mouth daily  Other Orders: Flu Vaccine 7yrs + ([address]) Admin 1st Vaccine (95621)  Patient Instructions: 1)  Follow up with Dr. Delrae Alfred in 6 months --CPP 2)  fasting lab visit in next 2 weeks --FLP, liver profile Prescriptions: HYDROCHLOROTHIAZIDE 25 MG TABS (HYDROCHLOROTHIAZIDE) take one tablet daily  #90 x 11   Entered and Authorized by:   Julieanne Manson MD   Signed by:   Julieanne Manson MD on 08/30/2010   Method used:   Faxed to ...       [facility] (retail)       [address] [place], [place]  [address]       Ph: [phone]       Fax: [phone]   RxID:   [id number]    Orders Added: 1)  Est. Patient Level III [99[address]] 2)  Flu Vaccine 56yrs + [[address]] 3)  Admin 1st Vaccine [90471] 4)  Mammogram (Screening) [Mammo]   Immunizations Administered:  Influenza Vaccine # 1:    Vaccine Type: Fluvax 3+    Site: left deltoid    Mfr: GlaxoSmithKline    Dose: 0.5 ml    Route: IM    Given by: Armenia Shannon    Exp. Date: 04/28/2011    Lot #: [id number]    VIS given: 05/23/10 version given August 30, 2010.  Flu Vaccine Consent Questions:    Do you have a history of severe allergic reactions to this vaccine? no    Any prior history of allergic reactions to egg and/or gelatin? no    Do you have a sensitivity to the preservative Thimersol? no    Do you have a past history of Guillan-Barre Syndrome? no    Do you currently have an acute febrile illness? no    Have you ever had a  severe reaction to latex? no    Vaccine information given and explained to patient? yes    Are you currently pregnant? no   Immunizations Administered:  Influenza Vaccine # 1:    Vaccine Type: Fluvax 3+    Site: left deltoid    Mfr: GlaxoSmithKline    Dose: 0.5 ml    Route: IM    Given by: Armenia Shannon    Exp. Date: 04/28/2011    Lot #: [id number]    VIS given: 05/23/10 version given August 30, 2010.

## 2010-11-28 NOTE — Assessment & Plan Note (Signed)
Summary: follow up with Dr Delrae Alfred in 6 months - hypertension/ COPD//GK   Vital Signs:  Patient profile:   65 year old female Menstrual status:  hysterectomy Weight:      126.38 pounds BMI:     20.47 O2 Sat:      97 % Temp:     97.8 degrees F Pulse rate:   106 / minute Pulse rhythm:   regular Resp:     22 per minute BP sitting:   181 / 97  (left arm) Cuff size:   regular  Vitals Entered By: Chauncy Passy, SMA CC: Pt. is here for a f/u on her HTN and COPD. Pt. states she has been feeling weak w/ little energy for the last past couple of months.  Is Patient Diabetic? Yes Did you bring your meter with you today? No Pain Assessment Patient in pain? no      CBG Result 96  Does patient need assistance? Functional Status Self care Ambulation Normal Comments Peak Flow: 130 - 200 - 190   Primary Care Provider:  Laury Axon  CC:  Pt. is here for a f/u on her HTN and COPD. Pt. states she has been feeling weak w/ little energy for the last past couple of months. .  History of Present Illness: 1.  Received a note stating she has hepatitis--discussed that was incorrect, not sure why I typed that in note--had a history of hepatic cysts that have been stable.  2.  Bilateral feet feel strange--tingling.  No low back pain.  No radicular symptoms.  Has been a gradual onset.  Hands get numb, but only if hands are cold.  Does not note a color change with them.  Feet also are only intermittently tingling--sounds like if she wears slippers, no symtoms.  Only arch of fee are involved.  Occurs with any shoe.    3.  DM:  A1C:  remains at 6.4%  Discussed whether should start meds or not--giving pancreas a "rest".  Pt. reluctant to start another medication, particularly with her current diet control success.  Has not had an eye check for 3 years.  4.  Hyperlipidemia:  feels she is doing everything she can with diet and exercise--the latter limited by her current fatigue.  5.  Dyphagia:  Better with  Ranitidine--did not start back up with diarrhea when restarted med.  Did also start back on allergy pill--again, this symptom is better.  Eating more solids now as well.  Now feels early satiety after eating just a couple of bites.  Points to her periumbilical area.  If passes flatus or has a bm, sense of fullness resolves and can eat some more.  Stool are formed and without melena or hematochezia.  Weight has remained stable.    6.  Fatigue:  Can hardly get out of bed in morning.  Labs were fine 2 months ago.  Feels like she sleeps well.  Goes to bed at 10 pm and is up at 7 a.m.  Does not feel rested.  Will often lie in bed for a while after as so tired.  Still looking for a job and stressed out.  7.  Hypertension:  stopped HCTZ with all of her symptoms last visit--bp up now.  Urinary frequency did not change with disontinuation.    Current Medications (verified): 1)  Hydrochlorothiazide 25 Mg Tabs (Hydrochlorothiazide) .... Take One Tablet Daily 2)  Proventil Hfa 108 (90 Base) Mcg/act Aers (Albuterol Sulfate) .Marland Kitchen.. 1-2 Puffs Every 4-6  Hours As Needed For Shortness of Breath 3)  Spiriva Handihaler 18 Mcg Caps (Tiotropium Bromide Monohydrate) .Marland Kitchen.. 1 Inhalation Daily 4)  Vitamin D 400 Unit Tabs (Cholecalciferol) .... 2 Tabs By Mouth Daily 5)  Synthroid 100 Mcg Tabs (Levothyroxine Sodium) .Marland Kitchen.. 1 Tab By Mouth Daily 6)  Bayer Aspirin 325 Mg Tabs (Aspirin) .... Take One A Day As Needed 7)  Accupril 20 Mg Tabs (Quinapril Hcl) .... 1/2 Tab By Mouth Daily--Finishing 5 Mg Enalapril First. 8)  Nasacort Aq 55 Mcg/act Aers (Triamcinolone Acetonide(Nasal)) .... 2 Sprays Each Nostril Daily 9)  Ranitidine Hcl 150 Mg Tabs (Ranitidine Hcl) .Marland Kitchen.. 1 Tab By Mouth Two Times A Day 10)  Xyzal 5 Mg Tabs (Levocetirizine Dihydrochloride) .Marland Kitchen.. 1 Tab By Mouth Daily As Needed Allergies  Allergies (verified): No Known Drug Allergies  Physical Exam  General:  NAD, appears anxious at times. Mouth:  pharynx pink and moist.     Neck:  No deformities, masses, or tenderness noted. Lungs:  Normal respiratory effort, chest expands symmetrically. Lungs are clear to auscultation, no crackles or wheezes. Heart:  Normal rate and regular rhythm. S1 and S2 normal without gallop, murmur, click, rub or other extra sounds.  Radial pulses normal and equal. Abdomen:  Bowel sounds positive,abdomen soft and non-tender without masses, organomegaly or hernias noted.   Impression & Recommendations:  Problem # 1:  DIABETES MELLITUS, TYPE II, CONTROLLED (ICD-250.00) Pt. elects to hold on medication--will think about starting Metformin Her updated medication list for this problem includes:    Bayer Aspirin 325 Mg Tabs (Aspirin) .Marland Kitchen... Take one a day as needed    Accupril 20 Mg Tabs (Quinapril hcl) .Marland Kitchen... 1/2 tab by mouth daily--finishing 5 mg enalapril first.  Orders: Capillary Blood Glucose/CBG (82948) T- Hemoglobin A1C (42595-63875) T-Urine Microalbumin w/creat. ratio [phone])  Problem # 2:  FOOT PAIN, BILATERAL (ICD-729.5)  Orders: Podiatry Referral (Podiatry)  Problem # 3:  HYPERLIPIDEMIA, MIXED (ICD-272.2) Start Simvastatin. Her updated medication list for this problem includes:    Simvastatin 20 Mg Tabs (Simvastatin) .Marland Kitchen... 1 tab by mouth daily  Problem # 4:  DYSPHAGIA PHARYNGEAL PHASE (AYT-016.01) IMproved with treatement of GERD and allergies. Maintaining weight.  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 Orders: T-TSH [phone]) T-T4, Free [phone])  Problem # 6:  HYPERTENSION (ICD-401.9) REstart HCTZ Her updated medication list for this problem includes:    Hydrochlorothiazide 25 Mg Tabs (Hydrochlorothiazide) .Marland Kitchen... Take one tablet daily    Accupril 20 Mg Tabs (Quinapril hcl) .Marland Kitchen... 1/2 tab by mouth daily--finishing 5 mg enalapril  first.  Problem # 7:  PERIPHERAL NEUROPATHY (ICD-356.9) Not clear if this is truly a peripheral neuropathy-very focal symptoms on feet. Orders: T-Vitamin B12 ([id number]) T-Folic Acid; RBC ([id number])  Complete Medication List: 1)  Hydrochlorothiazide 25 Mg Tabs (Hydrochlorothiazide) .... Take one tablet daily 2)  Proventil Hfa 108 (90 Base) Mcg/act Aers (Albuterol sulfate) .Marland Kitchen.. 1-2 puffs every 4-6 hours as needed for shortness of breath 3)  Spiriva Handihaler 18 Mcg Caps (Tiotropium bromide monohydrate) .Marland Kitchen.. 1 inhalation daily 4)  Vitamin D 400 Unit Tabs (Cholecalciferol) .... 2 tabs by mouth daily 5)  Synthroid 100 Mcg Tabs (Levothyroxine sodium) .Marland Kitchen.. 1 tab by mouth daily 6)  Bayer Aspirin 325 Mg Tabs (Aspirin) .... Take one a day as needed 7)  Accupril 20 Mg Tabs (Quinapril hcl) .... 1/2 tab by  mouth daily--finishing 5 mg enalapril first. 8)  Nasacort Aq 55 Mcg/act Aers (Triamcinolone acetonide(nasal)) .... 2 sprays each nostril daily 9)  Ranitidine Hcl 150 Mg Tabs (Ranitidine hcl) .Marland Kitchen.. 1 tab by mouth two times a day 10)  Xyzal 5 Mg Tabs (Levocetirizine dihydrochloride) .Marland Kitchen.. 1 tab by mouth daily as needed allergies 11)  Simvastatin 20 Mg Tabs (Simvastatin) .Marland Kitchen.. 1 tab by mouth daily  Patient Instructions: 1)  Nurse visit for bp check in 2 weeks. 2)  Follow up with Dr. Delrae Alfred in 4 months --DM, htn, fatigue. 3)  Lab visit for fasting labs in 8 weeks:  FLP, liver profile Prescriptions: SIMVASTATIN 20 MG TABS (SIMVASTATIN) 1 tab by mouth daily  #30 x 11   Entered and Authorized by:   Julieanne Manson MD   Signed by:   Julieanne Manson MD on 02/21/2010   Method used:   Print then Give to Patient   RxID:   [id number]   Laboratory Results   Blood Tests     HGBA1C: 6.4%   (Normal Range: Non-Diabetic - 3-6%   Control Diabetic - 6-8%) CBG Random:: 96mg /dL     Appended Document: follow up with Dr Delrae Alfred in 6 months - hypertension/ COPD//GK  Laboratory Results     Urine Tests    Routine Urinalysis   Glucose: negative   (Normal Range: Negative) Bilirubin: negative   (Normal Range: Negative) Ketone: negative   (Normal Range: Negative) Spec. Gravity: 1.025   (Normal Range: 1.003-1.035) Blood: trace-intact   (Normal Range: Negative) pH: 6.0   (Normal Range: 5.0-8.0) Protein: negative   (Normal Range: Negative) Urobilinogen: 0.2   (Normal Range: 0-1) Nitrite: negative   (Normal Range: Negative) Leukocyte Esterace: negative   (Normal Range: Negative)

## 2010-11-28 NOTE — Letter (Signed)
Summary: [facility]   Imported By: Arta Bruce 09/28/2010 11:25:54  _____________________________________________________________________  External Attachment:    Type:   Image     Comment:   External Document

## 2010-11-28 NOTE — Assessment & Plan Note (Signed)
Summary: follow up visit in 1 month//gk   Vital Signs:  Patient profile:   65 year old female Menstrual status:  hysterectomy Weight:      126 pounds Temp:     97.7 degrees F Pulse rate:   108 / minute Pulse rhythm:   regular Resp:     20 per minute BP sitting:   84 / 60  (left arm) Cuff size:   regular  Vitals Entered By: Vesta Mixer CMA (December 22, 2009 8:19 AM) CC: f/u on problems she was having with her throat and stomach pt is fasting today. Is Patient Diabetic? Yes Pain Assessment Patient in pain? yes     Location: all over CBG Result 93  Does patient need assistance? Ambulation Normal   Primary Care Provider:  Laury Axon  CC:  f/u on problems she was having with her throat and stomach pt is fasting today.Marland Kitchen  History of Present Illness: 1.  Dysphagia/neck and facial pain:  Took Amoxicillin and headaches, nasal bleeding improved.  Not clear how she took the Amoxicillin--initially states had 30 day supply instead of 10.  Does state she took the med two times a day until it was done and did not miss or stop.  Had a nasal bleed again 3 days ago--left nostril.  Pt. feels she is congested again on left side -- feels cold all over.  This is the first day since taking the antibiotics that she has felt this way.  Is not using the Nasocort.  Not taking Allegra as well.  States she is also performing the PT exercises we discussed at last visit.  Pt. is eating soups, yogurt, ice cream, mashed potatoes.    Stopped the Ranitidine after 1 week, but developed diarrhea, which she attributed to the Ranitidine and stopped--was taking Amoxicillin at the same time, however.  Just generally, does not feel well.   2.  DM:  Drinking a lot of water and urinating a lot.  Feels thirsty.  Allergies (verified): No Known Drug Allergies  Physical Exam  General:  NAD Head:  NT over frontal and maxillary sinuses Eyes:  No corneal or conjunctival inflammation noted. EOMI. Perrla. Funduscopic  exam benign, without hemorrhages, exudates or papilledema. Vision grossly normal. Ears:  TMs a bit dull Nose:  old blood/clot in left nostril.  Right nostril clear. Mouth:  pharynx pink and moist.   Neck:  No deformities, masses, or tenderness noted.no masses, no thyromegaly, and no thyroid nodules or tenderness.   Lungs:  Normal respiratory effort, chest expands symmetrically. Lungs are clear to auscultation, no crackles or wheezes. Heart:  Normal rate and regular rhythm. S1 and S2 normal without gallop, murmur, click, rub or other extra sounds.  Radial pulses normal and equal Abdomen:  Bowel sounds positive,abdomen soft and non-tender without masses, organomegaly or hernias noted.   Impression & Recommendations:  Problem # 1:  DYSPHAGIA PHARYNGEAL PHASE (ZOX-096.04) Neck pain as well Some improvement with treatment of what was felt to be sinusitis. Both chronic problems that have been evaluated significantly in the past--CT of neck and sinuses, swallowing studies, ENT and GI evaluation and treatment,  PT evaluation and treatment.  Feel her symptoms are multifactorial--muscular neck pain, allergies, GERD. Encouraged using Allegra Hold Nasocort with nosebleed. Surgilube or other water based lubricant for nose Nasal saline. To restart Ranitidine and see if diarrhea recurs--may have been from antibiotic  Problem # 2:  DIABETES MELLITUS, TYPE II, CONTROLLED (ICD-250.00) Discussed possible start of Metformin, but with her  dysphagia and recent go around with diarrhea, may need to wait until the former is doing better--only 1/2 lb weight loss since last visit. Her updated medication list for this problem includes:    Bayer Aspirin 325 Mg Tabs (Aspirin) .Marland Kitchen... Take one a day as needed    Accupril 20 Mg Tabs (Quinapril hcl) .Marland Kitchen... 1/2 tab by mouth daily--finishing 5 mg enalapril first.  Orders: Capillary Blood Glucose/CBG ([address])  Problem # 3:  WEIGHT LOSS, ABNORMAL  (ICD-783.21)  Orders: T-Comprehensive Metabolic Panel ([id number]) T-CBC w/Diff ([id number]) T-TSH ([id number]) T-T4, Free ([id number])  Complete Medication List: 1)  Hydrochlorothiazide 25 Mg Tabs (Hydrochlorothiazide) .... Take one tablet daily 2)  Proventil Hfa 108 (90 Base) Mcg/act Aers (Albuterol sulfate) .Marland Kitchen.. 1-2 puffs every 4-6 hours as needed for shortness of breath 3)  Spiriva Handihaler 18 Mcg Caps (Tiotropium bromide monohydrate) .Marland Kitchen.. 1 inhalation daily 4)  Vitamin D 400 Unit Tabs (Cholecalciferol) .... 2 tabs by mouth daily 5)  Synthroid 100 Mcg Tabs (Levothyroxine sodium) .Marland Kitchen.. 1 tab by mouth daily 6)  Bayer Aspirin 325 Mg Tabs (Aspirin) .... Take one a day as needed 7)  Accupril 20 Mg Tabs (Quinapril hcl) .... 1/2 tab by mouth daily--finishing 5 mg enalapril first. 8)  Nasacort Aq 55 Mcg/act Aers (Triamcinolone acetonide(nasal)) .... 2 sprays each nostril daily 9)  Allegra 180 Mg Tabs (Fexofenadine hcl) .Marland Kitchen.. 1 tab by mouth daily as needed allergies 10)  Ranitidine Hcl 150 Mg Tabs (Ranitidine hcl) .Marland Kitchen.. 1 tab by mouth two times a day  Other Orders: T-Lipid Profile ([id number])  Patient Instructions: 1)  Restart Ranitidine and Fexofenadine  2)  Hold HCTZ for now 3)  Follow up with Dr. Delrae Alfred in 2 months   Appended Document: follow up visit in 1 month//gk     Clinical Lists Changes  Orders: Added new Test order of T- * Misc. Laboratory test [phone]) - Signed

## 2010-11-28 NOTE — Assessment & Plan Note (Signed)
Summary: 4 MONTH FU DM/HTN/FATIGUE///KT   Vital Signs:  Patient profile:   65 year old female Menstrual status:  hysterectomy Height:      66 inches Weight:      131.2 pounds Temp:     97.4 degrees F oral Pulse rate:   106 / minute Pulse rhythm:   regular Resp:     18 per minute BP sitting:   110 / 64  (left arm) Cuff size:   regular  Vitals Entered By: Michelle Nasuti (June 23, 2010 8:46 AM) CC: pt presents to clinic for HTN, DM, and HYPERLIPIDEMIA follow up Is Patient Diabetic? Yes Did you bring your meter with you today? No Pain Assessment Patient in pain? no      CBG Result 102 CBG Device ID A FASTING  Does patient need assistance? Functional Status Self care Ambulation Normal   Primary Care Provider:  Laury Axon  CC:  pt presents to clinic for HTN, DM, and and HYPERLIPIDEMIA follow up.  History of Present Illness: 1.  Hyperlipidemia:  Pt. did not feel well taking Simvastatin, so was only taking about 50% of time--states she felt dizzy or drunk.  Switched to Lipitor and picked up and started last week.  Tolerating Lipitor much better.  Was not at goal with intermittent Simvastatin.    2.  DM:  Not checking sugars--diet controlled.  Has not had eye exam in 3 years.  Has not had foot exam in some time.  No numbness, tingling or burning in hands or feet.  No definite polyuria or polydipsia.    3.  Hypothyroidism:  TSH last check was in normal range with supplementation.    4.  Hypertension:  No problems with meds.  Has been controlled.  Not exercising regularly.  Trying to eat healthily.    5.  Aching feeling in left upper chest last week:  Started one morning after being awake for 2 hours.  Pt. was just sitting.  Felt heart beating fast with aching in high left chest and shoulder, then sense of being stuck with a pin in left medial breast area and developed numbness in left upper arm (can have this with cspine DDD history as well)  Lasted about 2 hours until took a couple  of aspirin and gradually improved.  Occured in the morning in the next 2 days as well.  Pt. states she was still on Simvastatin--did not switch to Lipitor until after these symptoms resolved.  Felt dyspnea with these episodes.  No associated nausea or vomiting. Later, states the pain actually started in her mid and left epigastrium--knife like pain that was similar to that she suffered in past with PUD.  Was at rest with all episodes and has not done anything to really exert herself in past couple of weeks.  These symptoms have not recurred for past week. Pt. states she was eating a lot of greasy, heavy foods at onset of symptoms.  She has since switched to a blander diet .  Pt. states she had what sounds like and exercise stress test within past 10 years at [facility] with previous primary.  She states it was okay.  Continues to smoke 1/2 ppd.  Positive family history for CAD--particularly her mother.  Habits & Providers  Alcohol-Tobacco-Diet     Tobacco Status: current     Cigarette Packs/Day: 1.0  Current Medications (verified): 1)  Hydrochlorothiazide 25 Mg Tabs (Hydrochlorothiazide) .... Take One Tablet Daily 2)  Proventil Hfa 108 (90 Base) Mcg/act Aers (  Albuterol Sulfate) .Marland Kitchen.. 1-2 Puffs Every 4-6 Hours As Needed For Shortness of Breath 3)  Spiriva Handihaler 18 Mcg Caps (Tiotropium Bromide Monohydrate) .Marland Kitchen.. 1 Inhalation Daily 4)  Vitamin D 400 Unit Tabs (Cholecalciferol) .... 2 Tabs By Mouth Daily 5)  Synthroid 100 Mcg Tabs (Levothyroxine Sodium) .Marland Kitchen.. 1 Tab By Mouth Daily 6)  Bayer Aspirin 325 Mg Tabs (Aspirin) .... Take One A Day As Needed 7)  Accupril 20 Mg Tabs (Quinapril Hcl) .... 1/2 Tab By Mouth Daily--Finishing 5 Mg Enalapril First. 8)  Nasacort Aq 55 Mcg/act Aers (Triamcinolone Acetonide(Nasal)) .... 2 Sprays Each Nostril Daily 9)  Ranitidine Hcl 150 Mg Tabs (Ranitidine Hcl) .Marland Kitchen.. 1 Tab By Mouth Two Times A Day 10)  Xyzal 5 Mg Tabs (Levocetirizine Dihydrochloride) .Marland Kitchen.. 1 Tab By Mouth  Daily As Needed Allergies 11)  Lipitor 10 Mg Tabs (Atorvastatin Calcium) .Marland Kitchen.. 1 Tab By Mouth Daily  Allergies (verified): No Known Drug Allergies  Social History: Packs/Day:  1.0  Physical Exam  General:  NAD Lungs:  Normal respiratory effort, chest expands symmetrically. Lungs are clear to auscultation, no crackles or wheezes. Heart:  Normal rate and regular rhythm. S1 and S2 normal without gallop, murmur, click, rub or other extra sounds.  Radial pulses normal and equal. Abdomen:  soft, normal bowel sounds, no hepatomegaly, and no splenomegaly.  Pt. describes tenderness when palpating epigastrium and periumbilical area--palpation of periumbilical area reproduces chest pain in left upper chest.   Impression & Recommendations:  Problem # 1:  CHEST PAIN UNSPECIFIED (ICD-786.50)  Pt. with multiple complicating factors with this chest pain and associated symptoms, including left cervical spine radiculopathy, hx of left shoulder pain, hx of GERD and PUD. Start Famotidine To take an aspirin daily Stop smoking. Orders: T-Comprehensive Metabolic Panel [phone]) T-CBC No Diff ([id number]) T-CK Total [id number]) T-CK MB Fract ([id number]) T- * Misc. Laboratory test [id number]) Troponin I EKG w/ Interpretation (93000):  Lot of baseline artifact, but does ;have lateral nonspecific ST T wave changes--perhaps some inferior changes as well.  Orders: T-Comprehensive Metabolic Panel [id number]) T-CBC No Diff ([id number]) T-CK Total [id number]) T-CK MB Fract ([id number]) T- * Misc. Laboratory test [id number]) EKG w/ Interpretation (93000) Diagnostic X-Ray/Fluoroscopy (Diagnostic X-Ray/Flu) Cardiology Referral (Cardiology)  Problem # 2:  DIABETES MELLITUS, TYPE II, CONTROLLED (ICD-250.00) controlled with diet--A1C 5.3% today Her updated medication list for this problem includes:    Bayer Aspirin 325 Mg Tabs (Aspirin) .Marland Kitchen... Take one a day as needed    Accupril 20 Mg Tabs (Quinapril  hcl) .Marland Kitchen... 1/2 tab by mouth daily--finishing 5 mg enalapril first.  Orders: Capillary Blood Glucose/CBG ([id number])  Problem # 3:  HYPERLIPIDEMIA, MIXED (ICD-272.2) Recent switch to Lipitor--recently not at goal. Her updated medication list for this problem includes:    Lipitor 10 Mg Tabs (Atorvastatin calcium) .Marland Kitchen... 1 tab by mouth daily  Problem # 4:  HYPOTHYROIDISM (ICD-244.9)  Recent TSH in good range Her updated medication list for this problem includes:    Synthroid 100 Mcg Tabs (Levothyroxine sodium) .Marland Kitchen... 1 tab by mouth daily  Her updated medication list for this problem includes:    Synthroid 100 Mcg Tabs (Levothyroxine sodium) .Marland Kitchen... 1 tab by mouth daily  Complete Medication List: 1)  Hydrochlorothiazide 25 Mg Tabs (Hydrochlorothiazide) .... Take one tablet daily 2)  Proventil Hfa 108 (90 Base) Mcg/act Aers (Albuterol sulfate) .Marland Kitchen.. 1-2 puffs every 4-6 hours as needed for shortness of breath 3)  Spiriva Handihaler 18 Mcg Caps (Tiotropium bromide monohydrate) .Marland Kitchen.. 1 inhalation daily 4)  Vitamin  D 400 Unit Tabs (Cholecalciferol) .... 2 tabs by mouth daily 5)  Synthroid 100 Mcg Tabs (Levothyroxine sodium) .Marland Kitchen.. 1 tab by mouth daily 6)  Bayer Aspirin 325 Mg Tabs (Aspirin) .... Take one a day as needed 7)  Accupril 20 Mg Tabs (Quinapril hcl) .... 1/2 tab by mouth daily--finishing 5 mg enalapril first. 8)  Nasacort Aq 55 Mcg/act Aers (Triamcinolone acetonide(nasal)) .... 2 sprays each nostril daily 9)  Famotidine 40 Mg Tabs (Famotidine) .Marland Kitchen.. 1 tab by mouth daily at bedtime 10)  Xyzal 5 Mg Tabs (Levocetirizine dihydrochloride) .Marland Kitchen.. 1 tab by mouth daily as needed allergies 11)  Lipitor 10 Mg Tabs (Atorvastatin calcium) .Marland Kitchen.. 1 tab by mouth daily  Patient Instructions: 1)  Follow up with Dr. Delrae Alfred in 2 months --chest pain and ?PUD 2)  Schedule [place] Prescriptions: FAMOTIDINE 40 MG TABS (FAMOTIDINE) 1 tab by mouth daily at bedtime  #30 x 0   Entered and Authorized by:   Julieanne Manson MD   Signed by:   Julieanne Manson MD on 06/23/2010   Method used:   Print then Give to Patient   RxID:   [id number]    Last LDL:                                                 144 (04/20/2010 11:07:00 PM)        Diabetic Foot Exam Foot Inspection Are the toenails long?                Yes Are the toenails thick?                Yes         10-g (5.07) Semmes-Weinstein Monofilament Test Performed by: Michelle Nasuti          Right Foot          Left Foot Visual Inspection     normal           normal Site 1         normal         normal Site 2         normal         normal Site 3         normal         normal Site 4         normal         normal Site 5         normal         normal Site 6         normal         normal Site 7         normal         normal Site 8         normal         normal Site 9         normal         normal Site 10         normal         normal  Laboratory Results   Blood Tests     HGBA1C: 5.3%   (Normal Range: Non-Diabetic - 3-6%   Control Diabetic - 6-8%) CBG Random:: 102

## 2010-11-28 NOTE — Progress Notes (Signed)
Summary: Proventil HFA  Phone Note Refill Request   Refills Requested: Medication #1:  PROVENTIL HFA 108 (90 BASE) MCG/ACT AERS 1-2 puffs every 4-6 hours as needed for shortness of breath health dept  Initial call taken by: [place] Shannon,  September 14, 2010 9:29 AM  Follow-up for Phone Call        Refills completed.  Dutch Quint RN  September 14, 2010 10:19 AM     Prescriptions: PROVENTIL HFA 108 (90 BASE) MCG/ACT AERS (ALBUTEROL SULFATE) 1-2 puffs every 4-6 hours as needed for shortness of breath  #1 x 3   Entered by:   Dutch Quint RN   Authorized by:   Julieanne Manson MD   Signed by:   Dutch Quint RN on 09/14/2010   Method used:   Historical   RxID:   [id number]

## 2010-11-28 NOTE — Assessment & Plan Note (Signed)
Summary: elevated bs/fasting/tmm   Vital Signs:  Patient profile:   65 year old female Menstrual status:  hysterectomy Weight:      126.5 pounds Temp:     97.9 degrees F Pulse rate:   116 / minute Pulse rhythm:   regular Resp:     18 per minute BP sitting:   99 / 66  (left arm) Cuff size:   regular  Vitals Entered By: Vesta Mixer CMA (November 11, 2009 9:22 AM) CC: not feeling well, not able to eat, had elevated blood sugar Is Patient Diabetic? No Pain Assessment Patient in pain? no       Does patient need assistance? Ambulation Normal   Primary Care Provider:  Laury Axon  CC:  not feeling well, not able to eat, and had elevated blood sugar.  History of Present Illness: 1.  Still feeling poorly since last seen.  Has worsened and has just not been able to get back in to office.  Feels like  throat is closing up and throat is dry.  Having bleeding from left nostril with pain into left sinuses and nostril.  Has headache pain from posterior neck to forehead.  Left throat also hurting as well.  Pt. relates that these are the same pains she has had in the past.  Did have some congestion in nose, but not noting that now.  More pain when leaning forward.  Denies chronic dry eyes and mouth.  No fevers.  Not eating well.  Not really eating solids now--last was day before yesterday--yogurt.  States dairy though makes her have GI upset--has not tried Lactaid tabs with dairy previously.  No vomiting or diarrhea.  Has lost 6 lbs since last seen.  Pt. later states she never was able to fill the H2 blocker I prescribed--PHD did not have Cimetidine and we asked them to switch to what they had.  Pt. did not have funds at the time and when went back--Rx destroyed.  Ultrasound and labs including CMET and CBC, amylase and lipase were fine.  2.  Hyperglycemia:  still up and with A1C today of 6.4% during a time period of 2 months when not eating much.  Discussed dx of DM, but sugars currently controlled.   Will need to reevaluate once able to eat better.    Allergies: No Known Drug Allergies  Physical Exam  General:  NAD, mildly ill appearing. Head:  Normocephalic and atraumatic without obvious abnormalities.  No definite sinus tenderness. Eyes:  No corneal or conjunctival inflammation noted. EOMI. Perrla. Funduscopic exam benign, without hemorrhages, exudates or papilledema. Vision grossly normal. Ears:  Canals dry Mouth:  pharynx pink and moist.  No lesions or erythema Neck:  Tender over traps and SCM muscles of neck bilaterally.  No adenopathy Lungs:  Normal respiratory effort, chest expands symmetrically. Lungs are clear to auscultation, no crackles or wheezes. Heart:  Normal rate and regular rhythm. S1 and S2 normal without gallop, murmur, click, rub or other extra sounds. Abdomen:  soft, normal bowel sounds, no masses, no guarding, no rigidity, no rebound tenderness, no hepatomegaly, and no splenomegaly.  Generalized mild tenderness   Impression & Recommendations:  Problem # 1:  DIABETES MELLITUS, TYPE II, CONTROLLED (ICD-250.00) No treatment of sugars for now--need to get pt. feeling better with by mouth intake  Her updated medication list for this problem includes:    Bayer Aspirin 325 Mg Tabs (Aspirin) .Marland Kitchen... Take one a day as needed    Accupril 20 Mg Tabs (Quinapril  hcl) ..... 1/2 tab by mouth daily--finishing 5 mg enalapril first.  Problem # 2:  DYSPHAGIA PHARYNGEAL PHASE (WJX-914.78) Not certain if could have a sinus infection as symptoms started up with URI type symptoms Also has not been treated with H2 blockers as wanted earlier-- try to get that started--suspect some element of GERD  Final element of muscular pain--pt. to increase PT exercises that really seemed to help neck and head pain previously  Complete Medication List: 1)  Hydrochlorothiazide 25 Mg Tabs (Hydrochlorothiazide) .... Take one tablet daily 2)  Proair Hfa 108 (90 Base) Mcg/act Aers (Albuterol sulfate)  .... One to two puffs every 4 hours as needed for shortness of breath 3)  Spiriva Handihaler 18 Mcg Caps (Tiotropium bromide monohydrate) .Marland Kitchen.. 1 inhalation daily 4)  Vitamin D 400 Unit Tabs (Cholecalciferol) .... 2 tabs by mouth daily 5)  Synthroid 100 Mcg Tabs (Levothyroxine sodium) .Marland Kitchen.. 1 tab by mouth daily 6)  Bayer Aspirin 325 Mg Tabs (Aspirin) .... Take one a day as needed 7)  Accupril 20 Mg Tabs (Quinapril hcl) .... 1/2 tab by mouth daily--finishing 5 mg enalapril first. 8)  Nasacort Aq 55 Mcg/act Aers (Triamcinolone acetonide(nasal)) .... 2 sprays each nostril daily 9)  Allegra 180 Mg Tabs (Fexofenadine hcl) .Marland Kitchen.. 1 tab by mouth daily as needed allergies 10)  Ranitidine Hcl 150 Mg Tabs (Ranitidine hcl) .Marland Kitchen.. 1 tab by mouth two times a day 11)  Amoxicillin 500 Mg Tabs (Amoxicillin) .Marland Kitchen.. 1 tab by mouth two times a day for 10 days  Patient Instructions: 1)  Follow up with Dr. Delrae Alfred in 1 month--swallowing difficulties, neck and abdominal pain Prescriptions: AMOXICILLIN 500 MG TABS (AMOXICILLIN) 1 tab by mouth two times a day for 10 days  #20 x 0   Entered and Authorized by:   Julieanne Manson MD   Signed by:   Julieanne Manson MD on 11/11/2009   Method used:   Print then Give to Patient   RxID:   [id number] RANITIDINE HCL 150 MG TABS (RANITIDINE HCL) 1 tab by mouth two times a day  #60 x 11   Entered and Authorized by:   Julieanne Manson MD   Signed by:   Julieanne Manson MD on 11/11/2009   Method used:   Print then Give to Patient   RxID:   [id number]   Laboratory Results   Blood Tests     HGBA1C: 6.4%   (Normal Range: Non-Diabetic - 3-6%   Control Diabetic - 6-8%) CBG Fasting:: 109mg /dL

## 2010-11-28 NOTE — Progress Notes (Signed)
  Medications Added LIPITOR 10 MG TABS (ATORVASTATIN CALCIUM) 1 tab by mouth daily       Phone Note From Pharmacy   Caller: [facility] Summary of Call: Can provide Lipitor at 10 mg no charge through [facility]--will switch pt. to this--notify her she will need to fill out forms for the drug manufacturer with [facility] at [facility] Initial call taken by: Julieanne Manson MD,  June 12, 2010 8:57 AM  Follow-up for Phone Call        Left message on answering machine for pt to return call  Follow-up by: Vesta Mixer CMA,  June 14, 2010 10:50 AM  Additional Follow-up for Phone Call Additional follow up Details #1::        Pt aware and will go to [facility] to fill out paperwork. Additional Follow-up by: Vesta Mixer CMA,  June 14, 2010 11:10 AM    New/Updated Medications: LIPITOR 10 MG TABS (ATORVASTATIN CALCIUM) 1 tab by mouth daily Prescriptions: LIPITOR 10 MG TABS (ATORVASTATIN CALCIUM) 1 tab by mouth daily  #30 x 2   Entered and Authorized by:   Julieanne Manson MD   Signed by:   Julieanne Manson MD on 06/12/2010   Method used:   Printed then faxed to ...       [facility] (retail)       [address] [place], [place]  [address]       Ph: [phone]       Fax: [phone]   RxID:   [id number]

## 2010-11-28 NOTE — Letter (Signed)
Summary: [facility]   Imported By: Arta Bruce 09/01/2010 15:39:05  _____________________________________________________________________  External Attachment:    Type:   Image     Comment:   External Document

## 2010-11-28 NOTE — Progress Notes (Signed)
Summary: REFILL ON CHOL. MED   Phone Note Call from Patient Call back at Home Phone [phone]   Reason for Call: Refill Medication Summary of Call: [name] Kristin [name] CALLED TO SAYS THAT WHEN Kristin [name] INCREASED HER SIMVASTATIN FROM 20 MG TO 40 MG, IT CAUSED HER TO RUN OUT OF MEDICATION FAST, AND WHEN SHE CALLS THE HEALTH DEP TO GET A REFILL, THEY SAY ITS TOO EARLY, AND SHE HAS ENOUGH TILL TOMORROW. Initial call taken by: Leodis Rains,  June 14, 2010 8:47 AM  Follow-up for Phone Call        Pt to go to [facility] to fill out [facility] paperwork for Lipitor.  Rx sent over Monday. Follow-up by: Vesta Mixer CMA,  June 14, 2010 11:11 AM

## 2010-11-30 NOTE — Letter (Signed)
Summary: Lipid Letter  [facility]  [address] [place], [place] [address]   Phone: [phone]  Fax: [phone]    10/17/2010  [facility] [address] [place], [place]  [address]  Dear Ms. Goin:  We have carefully reviewed your last lipid profile from 09/14/2010 and the results are noted below with a summary of recommendations for lipid management.    Cholesterol:       112     Goal: <200   HDL "good" Cholesterol:   41     Goal: >45   LDL "bad" Cholesterol:   52     Goal: <70   Triglycerides:       96     Goal: <150    Cholesterol is almost at goal.  HDL is a bit low.  Exercise can bring this up.  Keep working on diet and exercise.  No change to meds.    TLC Diet (Therapeutic Lifestyle Change): Saturated Fats & Transfatty acids should be kept < 7% of total calories ***Reduce Saturated Fats Polyunstaurated Fat can be up to 10% of total calories Monounsaturated Fat Fat can be up to 20% of total calories Total Fat should be no greater than 25-35% of total calories Carbohydrates should be 50-60% of total calories Protein should be approximately 15% of total calories Fiber should be at least 20-30 grams a day ***Increased fiber may help lower LDL Total Cholesterol should be < 200mg /day Consider adding plant stanol/sterols to diet (example: Benacol spread) ***A higher intake of unsaturated fat may reduce Triglycerides and Increase HDL    Adjunctive Measures (may lower LIPIDS and reduce risk of Heart Attack) include: Aerobic Exercise (20-30 minutes 3-4 times a week) Limit Alcohol Consumption Weight Reduction Aspirin 75-81 mg a day by mouth (if not allergic or contraindicated) Dietary Fiber 20-30 grams a day by mouth     Current Medications: 1)    Hydrochlorothiazide 25 Mg Tabs (Hydrochlorothiazide) .... Take one tablet daily 2)    Proventil Hfa 108 (90 Base) Mcg/act Aers (Albuterol sulfate) .Marland Kitchen.. 1-2 puffs every 4-6 hours as  needed for shortness of breath 3)    Spiriva Handihaler 18 Mcg Caps (Tiotropium bromide monohydrate) .Marland Kitchen.. 1 inhalation daily 4)    Vitamin D 400 Unit Tabs (Cholecalciferol) .... 2 tabs by mouth daily 5)    Synthroid 100 Mcg Tabs (Levothyroxine sodium) .Marland Kitchen.. 1 tab by mouth daily 6)    Bayer Aspirin 325 Mg Tabs (Aspirin) .... Take one a day as needed 7)    Accupril 20 Mg Tabs (Quinapril hcl) .... 1/2 tab by mouth daily--finishing 5 mg enalapril first. 8)    Nasacort Aq 55 Mcg/act Aers (Triamcinolone acetonide(nasal)) .... 2 sprays each nostril daily 9)    Famotidine 40 Mg Tabs (Famotidine) .Marland Kitchen.. 1 tab by mouth daily at bedtime 10)    Xyzal 5 Mg Tabs (Levocetirizine dihydrochloride) .Marland Kitchen.. 1 tab by mouth daily as needed allergies 11)    Lipitor 10 Mg Tabs (Atorvastatin calcium) .Marland Kitchen.. 1 tab by mouth daily  If you have any questions, please call. We appreciate being able to work with you.   Sincerely,    [facility] Julieanne Manson MD

## 2010-12-12 ENCOUNTER — Telehealth (INDEPENDENT_AMBULATORY_CARE_PROVIDER_SITE_OTHER): Payer: Self-pay | Admitting: Internal Medicine

## 2010-12-12 ENCOUNTER — Encounter (INDEPENDENT_AMBULATORY_CARE_PROVIDER_SITE_OTHER): Payer: Self-pay | Admitting: Internal Medicine

## 2010-12-12 ENCOUNTER — Other Ambulatory Visit (HOSPITAL_COMMUNITY): Payer: Self-pay | Admitting: Internal Medicine

## 2010-12-12 ENCOUNTER — Encounter: Payer: Self-pay | Admitting: Internal Medicine

## 2010-12-12 DIAGNOSIS — R198 Other specified symptoms and signs involving the digestive system and abdomen: Secondary | ICD-10-CM | POA: Insufficient documentation

## 2010-12-12 DIAGNOSIS — R109 Unspecified abdominal pain: Secondary | ICD-10-CM

## 2010-12-12 LAB — CONVERTED CEMR LAB
ALT: 16 units/L (ref 0–35)
Amylase: 94 units/L (ref 0–105)
BUN: 7 mg/dL (ref 6–23)
CO2: 28 meq/L (ref 19–32)
Calcium: 10.5 mg/dL (ref 8.4–10.5)
Chloride: 105 meq/L (ref 96–112)
Creatinine, Ser: 0.64 mg/dL (ref 0.40–1.20)

## 2010-12-13 ENCOUNTER — Telehealth: Payer: Self-pay | Admitting: Internal Medicine

## 2010-12-19 ENCOUNTER — Encounter: Payer: Self-pay | Admitting: Physician Assistant

## 2010-12-19 ENCOUNTER — Ambulatory Visit (INDEPENDENT_AMBULATORY_CARE_PROVIDER_SITE_OTHER): Payer: Self-pay | Admitting: Physician Assistant

## 2010-12-19 ENCOUNTER — Encounter: Payer: Self-pay | Admitting: Internal Medicine

## 2010-12-19 DIAGNOSIS — R141 Gas pain: Secondary | ICD-10-CM

## 2010-12-19 DIAGNOSIS — R1310 Dysphagia, unspecified: Secondary | ICD-10-CM

## 2010-12-19 DIAGNOSIS — R142 Eructation: Secondary | ICD-10-CM

## 2010-12-19 DIAGNOSIS — R634 Abnormal weight loss: Secondary | ICD-10-CM

## 2010-12-19 DIAGNOSIS — R6881 Early satiety: Secondary | ICD-10-CM | POA: Insufficient documentation

## 2010-12-19 DIAGNOSIS — R1013 Epigastric pain: Secondary | ICD-10-CM

## 2010-12-19 DIAGNOSIS — R143 Flatulence: Secondary | ICD-10-CM | POA: Insufficient documentation

## 2010-12-20 NOTE — Assessment & Plan Note (Signed)
Summary: Needs referral to ENT   Vital Signs:  Patient profile:   65 year old female Menstrual status:  hysterectomy Weight:      128.56 pounds O2 Sat:      95 % on Room air Pulse rate:   114 / minute Pulse rhythm:   regular Resp:     22 per minute BP sitting:   100 / 62  (left arm) Cuff size:   regular  Vitals Entered By: Hale Drone CMA (December 12, 2010 12:07 PM)  O2 Flow:  Room air CC: Would like to be referred to a specialist. Has been only eating soft foods x1 year. Every time she eats/drinks anything she will pass gas. Feeling full everytime and forcing herself to eat. Gets bloated and abd swollen every time she eats. Will get naseas every now and then. Takes antiacids for the nuasea. Is Patient Diabetic? Yes Pain Assessment Patient in pain? no      CBG Result 125 CBG Device ID B non fasting  Does patient need assistance? Functional Status Self care Ambulation Normal   Serial Vital Signs/Assessments:                                PEF    PreRx  PostRx Time      O2 Sat  O2 Type     L/min  L/min  L/min   By 12:13 PM  95  %   Room air                          Hale Drone CMA  Comments: 12:13 PM Peak Flow: 180 - 190 - 160 By: Hale Drone CMA    Primary Care Provider:  Laury Axon  CC:  Would like to be referred to a specialist. Has been only eating soft foods x1 year. Every time she eats/drinks anything she will pass gas. Feeling full everytime and forcing herself to eat. Gets bloated and abd swollen every time she eats. Will get naseas every now and then. Takes antiacids for the nuasea..  History of Present Illness: 1.  Pt. continues to have difficulties with dysphagia:  Pt. had discussed in recent previous visits that chest and GI symptoms were seemingly better, but now states only said that as she did not want to complain.  Has had more difficulties over the holidays and weight is back down again.  States she feels like she is getting food caught in left throat.  If  able to get food swallowed, has early satiety and food just sits there in stomach--loses appetite.  Also with a lot of gas, both belching and flatulation.  Pt. had similar symptoms previously.  She has given history of intermittent improvement.  Has had abdominal ultrasound, Swallowing studies,  CT of sinuses and neck, PT for neck and chest pain, jaw pain (felt to be musculoskeletal, Cardiac workup and was intially seen for some of these symptoms by GI in 2008.  These symptoms have been intermittent since before 2008.  Pt. often relates these constellation of symptoms as related to one process and we intermittently seem to make improvement   Has been on Kapidex--not clear why she stopped, Cimetidine, Ranitidine, Famotidine.  Using a combination Simethicone and aluminum/magnesium Hydroxide liquid.  Using Famotidine as well 150 mg once daily.  Current Medications (verified): 1)  Hydrochlorothiazide 25 Mg Tabs (Hydrochlorothiazide) .... Take One Tablet Daily 2)  Proventil Hfa 108 (90 Base) Mcg/act Aers (Albuterol Sulfate) .Marland Kitchen.. 1-2 Puffs Every 4-6 Hours As Needed For Shortness of Breath 3)  Spiriva Handihaler 18 Mcg Caps (Tiotropium Bromide Monohydrate) .Marland Kitchen.. 1 Inhalation Daily 4)  Vitamin D 400 Unit Tabs (Cholecalciferol) .... 2 Tabs By Mouth Daily 5)  Synthroid 100 Mcg Tabs (Levothyroxine Sodium) .Marland Kitchen.. 1 Tab By Mouth Daily 6)  Bayer Aspirin 325 Mg Tabs (Aspirin) .... Take One A Day As Needed 7)  Accupril 10 Mg Tabs (Quinapril Hcl) .... Take 1 Tab By Mouth Daily 8)  Nasacort Aq 55 Mcg/act Aers (Triamcinolone Acetonide(Nasal)) .... 2 Sprays Each Nostril Daily 9)  Famotidine 40 Mg Tabs (Famotidine) .Marland Kitchen.. 1 Tab By Mouth Daily At Bedtime 10)  Xyzal 5 Mg Tabs (Levocetirizine Dihydrochloride) .Marland Kitchen.. 1 Tab By Mouth Daily As Needed Allergies 11)  Lipitor 10 Mg Tabs (Atorvastatin Calcium) .Marland Kitchen.. 1 Tab By Mouth Daily  Allergies (verified): No Known Drug Allergies  Physical Exam  General:  NAD Lungs:  Normal  respiratory effort, chest expands symmetrically. Lungs are clear to auscultation, no crackles or wheezes. Heart:  Normal rate and regular rhythm. S1 and S2 normal without gallop, murmur, click, rub or other extra sounds. Radial pulses normal and equal Abdomen:  soft, normal bowel sounds, no hepatomegaly, and no splenomegaly.  Tender across epigastrium.  No rebound or peritoneal signs   Diabetes Management Exam:    Foot Exam (with socks and/or shoes not present):       Sensory-Monofilament:          Left foot: normal          Right foot: normal   Impression & Recommendations:  Problem # 1:  EARLY SATIETY (ICD-789.9) With epigastric pain and dysphagia  Orders: Gastroenterology Referral (GI) Diagnostic X-Ray/Fluoroscopy (Diagnostic X-Ray/Flu) T-Amylase (47425-95638) T-Lipase (75643-32951) T-Comprehensive Metabolic Panel (88416-60630)  Complete Medication List: 1)  Hydrochlorothiazide 25 Mg Tabs (Hydrochlorothiazide) .... Take one tablet daily 2)  Proventil Hfa 108 (90 Base) Mcg/act Aers (Albuterol sulfate) .Marland Kitchen.. 1-2 puffs every 4-6 hours as needed for shortness of breath 3)  Spiriva Handihaler 18 Mcg Caps (Tiotropium bromide monohydrate) .Marland Kitchen.. 1 inhalation daily 4)  Vitamin D 400 Unit Tabs (Cholecalciferol) .... 2 tabs by mouth daily 5)  Synthroid 100 Mcg Tabs (Levothyroxine sodium) .Marland Kitchen.. 1 tab by mouth daily 6)  Bayer Aspirin 325 Mg Tabs (Aspirin) .... Take one a day as needed 7)  Accupril 10 Mg Tabs (Quinapril hcl) .... Take 1 tab by mouth daily 8)  Nasacort Aq 55 Mcg/act Aers (Triamcinolone acetonide(nasal)) .... 2 sprays each nostril daily 9)  Xyzal 5 Mg Tabs (Levocetirizine dihydrochloride) .Marland Kitchen.. 1 tab by mouth daily as needed allergies 10)  Lipitor 10 Mg Tabs (Atorvastatin calcium) .Marland Kitchen.. 1 tab by mouth daily 11)  Nexium 40 Mg Cpdr (Esomeprazole magnesium) .Marland Kitchen.. 1 cap by mouth 1/2 hour before breakfast.  Other Orders: Capillary Blood Glucose/CBG (82948) Peak Flow Rate (94150) Pulse  Oximetry (single measurment) (16010) Prescriptions: NEXIUM 40 MG CPDR (ESOMEPRAZOLE MAGNESIUM) 1 cap by mouth 1/2 hour before breakfast.  #30 x 4   Entered and Authorized by:   Julieanne Manson MD   Signed by:   Julieanne Manson MD on 12/12/2010   Method used:   Print then Give to Patient   RxID:   [id number]    Orders Added: 1)  Capillary Blood Glucose/CBG [82948] 2)  Peak Flow Rate [94150] 3)  Pulse Oximetry (single measurment) [94760] 4)  Gastroenterology Referral [GI] 5)  Est. Patient Level III [06237] 6)  Diagnostic X-Ray/Fluoroscopy [Diagnostic X-Ray/Flu] 7)  T-Amylase [82150-23210] 8)  T-Lipase [83690-23215] 9)  T-Comprehensive Metabolic Panel [80053-22900]     Diabetic Foot Exam    10-g (5.07) Semmes-Weinstein Monofilament Test Performed by: Hale Drone CMA          Right Foot          Left Foot Visual Inspection     normal         normal Test Control      normal         normal Site 1         normal         normal Site 2         normal         normal Site 3         normal         normal Site 4         normal         normal Site 5         normal         normal Site 6         normal         normal Site 7         normal         normal Site 8         normal         normal Site 9         normal         normal Site 10         normal         normal  Impression      normal         normal

## 2010-12-20 NOTE — Progress Notes (Signed)
Summary: GI referral  Phone Note Outgoing Call   Summary of Call: [name]--GI referral--saw Dr. Marina Goodell previously--not sure if he can see her again Initial call taken by: Julieanne Manson MD,  December 12, 2010 2:06 PM  Follow-up for Phone Call        I call Kristin [name] to schedule an appt with Dr Marina Goodell they said that he is all book until march and they don't have april schedule . I have to call back in march for an appt in April.Cheryll Dessert  December 12, 2010 3:52 PM   Additional Follow-up for Phone Call Additional follow up Details #1::        Please let pt. know--if she would like to go somewhere else, that's okay with me, I just don't know if another will see her.  She has not had any procedures with Dr. Marina Goodell.  He just saw her once in 2008. Additional Follow-up by: Julieanne Manson MD,  December 12, 2010 6:40 PM    Additional Follow-up for Phone Call Additional follow up Details #2::    LVM TO PT TO CALL ME BACK.Cheryll Dessert  December 13, 2010 11:31 AM Kristin [name] call me back and she decide to wait until April I told her that I would call [facility] to see if they have any cancelations and I will call her with an appt .Marland KitchenCheryll Dessert  December 13, 2010 12:23 PM     Appended Document: GI referral *PT HAS AN APPT 12-19-10 @ 9AM TO SEE PA AMY ESTERWOOD PT AWARE OF HER APPT.Marland KitchenCheryll Dessert  December 13, 2010 3:57 PM

## 2010-12-20 NOTE — Letter (Signed)
Summary: TEST ORDER FROM//DIAGNOSTIC X-RAY//APPT DATE & TIME  TEST ORDER FROM//DIAGNOSTIC X-RAY//APPT DATE & TIME   Imported By: Arta Bruce 12/13/2010 12:06:52  _____________________________________________________________________  External Attachment:    Type:   Image     Comment:   External Document

## 2010-12-20 NOTE — Progress Notes (Signed)
Summary: triage / Wants appt for wt. loss and dysphagia   Phone Note Call from Patient Call back at Home Phone [phone]   Caller: Arna Medici @ [facility] Call For: Dr. Marina Goodell Reason for Call: Talk to Nurse Summary of Call: Arna Medici is calling because this patient needs to be seen for  Abnormal weight loss, dysphagia and possible EGD, wants [place] to call patient and then call her if we schedule to give her the appt date Initial call taken by: [place] Johnson,  December 13, 2010 12:28 PM  Follow-up for Phone Call        Appointment made for patient to see Mike Gip, PA for 12/19/10@9am . Arna Medici aware of appointment date and time. Follow-up by: Selinda Michaels RN,  December 13, 2010 1:40 PM  Additional Follow-up for Phone Call Additional follow up Details #1::        LOOKS LIKE THE PATIENT HAS GAINED 2 POUNDS OVER THE PAST YEAR.  OK TO BE SEEN FOR DYSPHAGIA. MAY NEED EGD. Hilarie Fredrickson MD  December 13, 2010 5:14 PM

## 2010-12-20 NOTE — Letter (Signed)
Summary: *Referral Letter  [facility]  [address] [place], [place] [address]   Phone: [phone]  Fax: [phone]    12/12/2010  Thank you in advance for agreeing to see my patient:  [facility] [address] [place], [place]  [address]  Phone: [phone]  Reason for Referral: Long term intermittent dysphagia, early satiety, epigastric pain and bloating. Has had most of these symptoms since then.  Intermittent weight loss.  Starting Nexium today and obtaining gastric emptying study.  Has been to Dr. Marina Goodell in past--no studies done.  Felt to be related to stress at the time.  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  Has been to cardiology for intermittent chest pain she associates with this--negative cardiac work up.  Swallowing studies, upper GI in past okay as well as abdominal ultrasound.  CT of neck/throat and sinuses okay in past  Procedures Requested: Evaluation and ? 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) 8)  HYPERLIPIDEMIA, MIXED (ICD-272.2) 9)  ENCOUNTER FOR LONG-TERM USE OF OTHER MEDICATIONS (ICD-V58.69) 10)  ROUTINE GYNECOLOGICAL EXAMINATION (ICD-V72.31) 11)  ALLERGIC RHINITIS (ICD-477.9) 12)  VITILIGO (ICD-709.01) 13)  DEGENERATIVE DISC DISEASE, CERVICAL SPINE (ICD-722.4) 14)  NECK PAIN, LEFT (ICD-723.1) 15)  DYSPHAGIA PHARYNGEAL PHASE (ICD-787.23) 16)  ABDOMINAL PAIN-GENERALIZED (ICD-789.07) 17)  UNSPECIFIED VITAMIN D DEFICIENCY (ICD-268.9) 18)  PREVENTIVE HEALTH CARE (ICD-V70.0) 19)   GERD (ICD-530.81) 20)  PUD, HX OF (ICD-V12.71) 21)  SINUSITIS, ACUTE (ICD-461.9) 22)  CHEST PAIN UNSPECIFIED (ICD-786.50) 23)  FATIGUE (ICD-780.79) 24)  MYALGIA (ICD-729.1) 25)  EAR PAIN, LEFT (ICD-388.70) 26)  COPD (ICD-496) 27)  BRONCHITIS, OBSTRUCTIVE CHRONIC W/EXACRB (ICD-491.21) 28)  Family Hx of AMI NOS, UNSPECIFIED (ICD-410.90) 29)  FAMILY HISTORY DIABETES 1ST DEGREE RELATIVE (ICD-V18.0) 30)  OCULAR MIGRAINE (ICD-346.80) 31)  HYPOTHYROIDISM (ICD-244.9) 32)  HYPERTENSION (ICD-401.9)   Current Medications: 1)  HYDROCHLOROTHIAZIDE 25 MG TABS (HYDROCHLOROTHIAZIDE) take one tablet daily 2)  PROVENTIL HFA 108 (90 BASE) MCG/ACT AERS (ALBUTEROL SULFATE) 1-2 puffs every 4-6 hours as needed for shortness of breath 3)  SPIRIVA HANDIHALER 18 MCG CAPS (TIOTROPIUM BROMIDE MONOHYDRATE) 1 inhalation daily 4)  VITAMIN D 400 UNIT TABS (CHOLECALCIFEROL) 2 tabs by mouth daily 5)  SYNTHROID 100 MCG TABS (LEVOTHYROXINE SODIUM) 1 tab by mouth daily 6)  BAYER ASPIRIN 325 MG TABS (ASPIRIN) take one a day as needed 7)  ACCUPRIL 10 MG TABS (QUINAPRIL HCL) Take 1 tab by mouth daily 8)  NASACORT AQ 55 MCG/ACT AERS (TRIAMCINOLONE ACETONIDE(NASAL)) 2 sprays each nostril daily 9)  XYZAL 5 MG TABS (LEVOCETIRIZINE DIHYDROCHLORIDE) 1 tab by mouth daily as needed allergies 10)  LIPITOR 10 MG TABS (ATORVASTATIN CALCIUM) 1 tab by mouth daily 11)  NEXIUM 40 MG CPDR (ESOMEPRAZOLE MAGNESIUM) 1 cap by mouth 1/2 hour before breakfast.   Past Medical History: 1)  VITILIGO (ICD-709.01) 2)  DEGENERATIVE DISC DISEASE, CERVICAL SPINE (ICD-722.4) 3)  NECK PAIN, LEFT (ICD-723.1) 4)  DYSPHAGIA PHARYNGEAL PHASE (ICD-787.23) 5)  ABDOMINAL PAIN-GENERALIZED (ICD-789.07) 6)  UNSPECIFIED VITAMIN D DEFICIENCY (ICD-268.9) 7)  PREVENTIVE HEALTH CARE (ICD-V70.0) 8)  GERD (ICD-530.81) 9)  PUD, HX OF (ICD-V12.71) 10)  SINUSITIS, ACUTE (ICD-461.9) 11)  CHEST PAIN UNSPECIFIED (ICD-786.50) 12)  FATIGUE (ICD-780.79) 13)  MYALGIA  (ICD-729.1) 14)  EAR PAIN, LEFT (ICD-388.70) 15)  COPD (ICD-496) 16)  BRONCHITIS, OBSTRUCTIVE CHRONIC W/EXACRB (ICD-491.21) 17)  Family Hx of AMI NOS, UNSPECIFIED (ICD-410.90) 18)  FAMILY HISTORY DIABETES 1ST DEGREE RELATIVE (ICD-V18.0) 19)  OCULAR MIGRAINE (ICD-346.80) 20)  HYPOTHYROIDISM (ICD-244.9) 21)  HYPERTENSION (ICD-401.9)   Prior History of Blood Transfusions:   Pertinent Labs:    Thank you again for agreeing to see our patient; please contact us if you have any further questions or need additional information.  Sincerely,  Julieanne Manson MD

## 2010-12-26 ENCOUNTER — Ambulatory Visit (HOSPITAL_COMMUNITY)
Admission: RE | Admit: 2010-12-26 | Discharge: 2010-12-26 | Disposition: A | Discharge status: Home / Self Care | Payer: Self-pay | Source: Ambulatory Visit | Attending: Internal Medicine | Admitting: Internal Medicine

## 2010-12-26 DIAGNOSIS — R109 Unspecified abdominal pain: Secondary | ICD-10-CM

## 2010-12-26 MED ORDER — TECHNETIUM TC 99M SULFUR COLLOID
2.0000 | Freq: Once | INTRAVENOUS | Status: AC | PRN
  Administered 2010-12-26: 2 via INTRAVENOUS

## 2010-12-26 NOTE — Procedures (Signed)
Summary: [facility] COLON   Colonoscopy  Procedure date:  02/05/2007  Findings:      Location:  [facility].   Patient Name: Kristin [name], Kristin [name] MRN: [id number] Procedure Procedures: Colonoscopy CPT: [phone].  Personnel: Endoscopist: Wilhemina Bonito. Marina Goodell, MD.  Referred By: Loreen Freud, DO.  Exam Location: Exam performed in Outpatient Clinic. Outpatient  Patient Consent: Procedure, Alternatives, Risks and Benefits discussed, consent obtained, from patient. Consent was obtained by the RN.  Indications  Average Risk Screening Routine.  History  Current Medications: Patient is not currently taking Coumadin.  Pre-Exam Physical: Performed Feb 05, 2007. Cardio-pulmonary exam, Rectal exam, HEENT exam , Abdominal exam, Mental status exam WNL.  Comments: Pt. history reviewed/updated, physical exam performed prior to initiation of sedation?YES Exam Exam: Extent of exam reached: Cecum, extent intended: Cecum.  The cecum was identified by appendiceal orifice and IC valve. Patient position: on left side. Time to Cecum: 00:03:44. Time for Withdrawl: 00:11:08. Colon retroflexion performed. Images taken. ASA Classification: II. Tolerance: excellent.  Monitoring: Pulse and BP monitoring, Oximetry used. Supplemental O2 given.  Colon Prep Used MOVI for colon prep. Prep results: poor.  Sedation Meds: Patient assessed and found to be appropriate for moderate (conscious) sedation. Fentanyl given IV. Versed given IV.  Comments: PATIENT DID NOT COMPLETE PREP AS INSTRUCTED. 4 ENEMAS ADMINISTERED PRIOR Findings NORMAL EXAM: Cecum to Rectum.    Comments: POOR PREP. CAN ONLY EXCLUDE LUMEN OCCLUDING LESIONS Assessment Normal examination.  Comments: POOR PREP Events  Unplanned Interventions: No intervention was required.  Unplanned Events: There were no complications. Plans Disposition: After procedure patient sent to recovery. After recovery patient sent home.    Scheduling/Referral: Colonoscopy, to Wilhemina Bonito. Marina Goodell, MD, FOR SCREENING,IN 5 YEARS WITH MORE EFFECTIVE PREP. GIVEN LIMITATIONS TODAY, I RECOMMEND YEARLY HEMOCCULT STUDIES WITH DR Laury Axon,   cc: Janit Bern  This report was created from the original endoscopy report, which was reviewed and signed by the above listed endoscopist.

## 2010-12-26 NOTE — Letter (Signed)
Summary: EGD Instructions  [facility]  [address] [place], [place] [address]   Phone: [phone]  Fax: [phone]       Kristin [name]    09-28-1946    MRN: [id number]       Procedure Day /Date:01-03-2011     Arrival Time: 1:00 PM      Procedure Time: 2:00 PM     Location of Procedure:                    X     [facility] (4th Floor)   PREPARATION FOR ENDOSCOPY   On 01-03-2011  THE DAY OF THE PROCEDURE: Wednesday  1.   No solid foods, milk or milk products are allowed after midnight the night before your procedure.  2.   Do not drink anything colored red or purple.  Avoid juices with pulp.  No orange juice.  3.  You may drink clear liquids until 2:00 PM , which is 2 hours before your procedure.                                                                                                CLEAR LIQUIDS INCLUDE: Water Jello Ice Popsicles Tea (sugar ok, no milk/cream) Powdered fruit flavored drinks Coffee (sugar ok, no milk/cream) Gatorade Juice: apple, white grape, white cranberry  Lemonade Clear bullion, consomm, broth Carbonated beverages (any kind) Strained chicken noodle soup Hard Candy   MEDICATION INSTRUCTIONS  Unless otherwise instructed, you should take regular prescription medications with a small sip of water as early as possible the morning of your procedure.        OTHER INSTRUCTIONS  You will need a responsible adult at least 63 years of age to accompany you and drive you home.   This person must remain in the waiting room during your procedure.  Wear loose fitting clothing that is easily removed.  Leave jewelry and other valuables at home.  However, you may wish to bring a book to read or an iPod/MP3 player to listen to music as you wait for your procedure to start.  Remove all body piercing jewelry and leave at home.  Total time from sign-in until discharge is approximately 2-3 hours.  You should go home  directly after your procedure and rest.  You can resume normal activities the day after your procedure.  The day of your procedure you should not:   Drive   Make legal decisions   Operate machinery   Drink alcohol   Return to work  You will receive specific instructions about eating, activities and medications before you leave.    The above instructions have been reviewed and explained to me by   _______________________    I fully understand and can verbalize these instructions _____________________________ Date _________

## 2010-12-26 NOTE — Assessment & Plan Note (Addendum)
Summary: Dysphagia & weight loss    History of Present Illness Visit Type: Follow-up Visit Primary GI MD: Yancey Flemings MD Primary Provider: Julieanne Manson, MD Requesting Provider: Julieanne Manson, MD Chief Complaint: dysphagia & weight loss x 1 1/2 tear History of Present Illness:   PLEASANT 64 YO FEMALE KNOWN TO DR. PERRY . SHE HAS HX OF GERD, AND HAD A NEGATIVE COLONOSCOPY IN 2008 THOUGH POORLY PREPPED.  SHE COMES IN TODAY WITH C/O DYSPHAGIA TO SOLIDS,LIQUIDS AND PILLS. SHE CRUSHES ALL HER PILLS, AND SAYS SHE HAS MOSTLY BEEN EATING SOUP,YOGURT ETC FOR A LONG TIME. SHE SAYS FOOD SITS IN HER ESOPHAGUS OVER THE PAST YEAR OR SO. ALSO FEELS HER FOOD IS NOT DIGESTING SHE HAS LOST WEIGHT ,PERHAPS 12 POUNDS. NO NAUSEA ,OR VOMITING.. SHE HAS BEEN USING EQUATE ANTACID WITH SOME RELIEF. JUST GOT A RX FOR NEXIUM BUT DIDN'T START YET. SHE REALLY HAS NOT HAD HEARTBURN OR REFLUX   SHE C/O MORE ABDOMINAL BLOATING,DISTENTION,FULLNESS IN UPPER ABDOMEN. NO REGULAR NSAID ,BABY ASA DAILY. NO PRIOR EGD. SHE HAS BEEN SCHEDULED FOR A GASTRIC EMPTYING SCAN NEXT WEEK PER PRIMARY MD.   GI Review of Systems    Reports abdominal pain, acid reflux, belching, bloating, dysphagia with solids, and  weight loss.     Location of  Abdominal pain: epigastric area.    Denies chest pain, dysphagia with liquids, heartburn, loss of appetite, nausea, vomiting, vomiting blood, and  weight gain.      Reports constipation and  diarrhea.     Denies anal fissure, black tarry stools, change in bowel habit, diverticulosis, fecal incontinence, heme positive stool, hemorrhoids, irritable bowel syndrome, jaundice, light color stool, liver problems, rectal bleeding, and  rectal pain.    Current Medications (verified): 1)  Hydrochlorothiazide 25 Mg Tabs (Hydrochlorothiazide) .... Take One Tablet Daily 2)  Proventil Hfa 108 (90 Base) Mcg/act Aers (Albuterol Sulfate) .Marland Kitchen.. 1-2 Puffs Every 4-6 Hours As Needed For Shortness of Breath 3)   Spiriva Handihaler 18 Mcg Caps (Tiotropium Bromide Monohydrate) .Marland Kitchen.. 1 Inhalation Daily 4)  Vitamin D 400 Unit Tabs (Cholecalciferol) .... 2 Tabs By Mouth Daily 5)  Synthroid 100 Mcg Tabs (Levothyroxine Sodium) .Marland Kitchen.. 1 Tab By Mouth Daily 6)  Bayer Aspirin 325 Mg Tabs (Aspirin) .... Take One A Day As Needed 7)  Accupril 10 Mg Tabs (Quinapril Hcl) .... Take 1 Tab By Mouth Daily 8)  Nasacort Aq 55 Mcg/act Aers (Triamcinolone Acetonide(Nasal)) .... 2 Sprays Each Nostril Daily 9)  Lipitor 10 Mg Tabs (Atorvastatin Calcium) .Marland Kitchen.. 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  Years ago: TAH and USO for endometriosis. 2.  years ago--Thyroidectomy, partial--for hyperthyroidism not controlled with medication. 3.  Right breast biopsy--benign 4.)COLONOSCOPY 2008 -DR PERRY,NEGATIVE BUT POOR PREP  Family History: Reviewed history from 08/19/2009 and no changes required. Family History Diabetes 1st degree relative Family History Hypertension-grandmother Mother, died 58:  DM complications, MI, double amputee Father, died 37:  Cerebral hemorrhage Brother, 53:  hypertension 2 Sisters:  Age 80, crack cocaine abuse,  heart and lung disease.  Age 82:  health unknown. 3 children:  41, 40, 30:  healthy as far as she knows.  Social History: Reviewed history from 08/19/2009  and no changes required. Occupation: unemployed, previously worked at [employer] Regular exercise-yes  Review of Systems       The patient complains of change in vision, fatigue, and shortness of breath.  The patient denies allergy/sinus, anemia, anxiety-new, arthritis/joint pain, back pain, blood in urine, breast changes/lumps, confusion, cough, coughing up blood, depression-new, fainting, fever, headaches-new, hearing problems, heart murmur, heart rhythm changes, itching, menstrual pain, muscle pains/cramps, night sweats, nosebleeds, pregnancy symptoms, skin rash, sleeping problems, sore throat, swelling of feet/legs, swollen lymph glands, thirst - excessive , urination - excessive , urination changes/pain, urine leakage, vision changes, and voice change.         SEE HPI  Vital Signs:  Patient profile:   65 year old female Menstrual status:  hysterectomy Height:      66 inches Weight:      128.50 pounds BMI:     20.82 Pulse rate:   64 / minute Pulse rhythm:   regular BP sitting:   90 / 66  (left arm) Cuff size:   regular  Vitals Entered By: June McMurray CMA Duncan Dull) (December 19, 2010 8:56 AM)  Physical Exam  General:  Well developed, well nourished, no acute distress. Head:  Normocephalic and atraumatic. Eyes:  PERRLA, no icterus. Lungs:  Clear throughout to auscultation. Heart:  Regular rate and rhythm; no murmurs, rubs,  or bruits. Abdomen:  SOFT, BS+ TENDER UPPER ABDOMEN, NO REBOUND, NO PALP MASS OR HSM Rectal:  NOT DONE Extremities:  No clubbing, cyanosis, edema or deformities noted. Neurologic:  Alert and  oriented x4;  grossly normal neurologically. Psych:  Alert and cooperative. Normal mood and affect.   Impression & Recommendations:  Problem # 1:  DYSPHAGIA (ICD-787.29) Assessment Deteriorated 64 YO FEMALE WITH DYSPHAGIA X OVER ONE YEAR-SOME COMPONENT OF LIQUID AND SOLID DYSPHAGIA. R/O MOTILITY DISORDER, R/O STRICTURE   SOFT  DIET ENSURE OR BOOST HIGH PROTEIN -TWICE DAILY ADVISED TO START NEXIUM 40 MG ONCE DAILY IN AM SCHEDULE FOR UPPER ENDOSCOPY WITH POSSIBLE SAVARY DILATION PER DR. PERRY-PROCEDURE DISCUSSED IN DETAIL WITH PT.  Problem # 2:  EARLY SATIETY (ICD-789.9) SXS ONGOING FOR ONE YEAR,ASSOCIATED WITH BLOATING, DISTENTION  R/O GASTROPARESIS   SEE ABOVE GASTRIC EMPTYING SCAN IS PENDING Orders: EGD SAV (EGD SAV)  Problem # 3:  WEIGHT LOSS, ABNORMAL (ICD-783.21) Assessment: Comment Only SEE ABOVE Orders: EGD SAV (EGD SAV)  Problem # 4:  DIABETES MELLITUS, TYPE II, CONTROLLED (ICD-250.00) Assessment: Comment Only NEW DX, NO MEDS AS YET  Problem # 5:  COPD (ICD-496) Assessment: Comment Only  Problem # 6:  HYPERTENSION (ICD-401.9) Assessment: Comment Only  Problem # 7:  HYPOTHYROIDISM (ICD-244.9) Assessment: Comment Only  Patient Instructions: 1)  We have scheduled the Endoscopy with Dr. Yancey Flemings. 2)  Directions and brochure provided. 3)  Kristin [name] Patient Information Guide given to patient. 4)  Conscous sedation informtaion provided. 5)  Start taking Nexium 40 mg , 30  min prior to breakfast. 6)  Start taking Boost or Ensure, high protein- 1-2 daily. 7)  Copy sent to : Julieanne Manson, MD 8)  The medication list was reviewed and reconciled.  All changed / newly prescribed medications were explained.  A complete medication list was provided to the patient / caregiver.

## 2010-12-27 ENCOUNTER — Telehealth (INDEPENDENT_AMBULATORY_CARE_PROVIDER_SITE_OTHER): Payer: Self-pay | Admitting: Internal Medicine

## 2011-01-03 ENCOUNTER — Encounter (AMBULATORY_SURGERY_CENTER): Payer: Self-pay | Admitting: Internal Medicine

## 2011-01-03 ENCOUNTER — Encounter: Payer: Self-pay | Admitting: Internal Medicine

## 2011-01-03 DIAGNOSIS — R143 Flatulence: Secondary | ICD-10-CM

## 2011-01-03 DIAGNOSIS — R634 Abnormal weight loss: Secondary | ICD-10-CM

## 2011-01-03 DIAGNOSIS — R1310 Dysphagia, unspecified: Secondary | ICD-10-CM

## 2011-01-03 HISTORY — PX: ESOPHAGOGASTRODUODENOSCOPY: SHX1529

## 2011-01-09 NOTE — Procedures (Signed)
Summary: Upper Endoscopy  Patient: [name] Note: All result statuses are Final unless otherwise noted.  Tests: (1) Upper Endoscopy (EGD)   EGD Upper Endoscopy       DONE     [facility]     [address]     [place], [place]  [address]          ENDOSCOPY PROCEDURE REPORT          PATIENT:  Kristin [name], Kristin [name]  MR#:  [id number]     BIRTHDATE:  05-12-1946, 64 yrs. old  GENDER:  female          ENDOSCOPIST:  Wilhemina Bonito. Eda Keys, MD     Referred by:  Julieanne Manson, M.D.          PROCEDURE DATE:  01/03/2011     PROCEDURE:  EGD, diagnostic 43235     ASA CLASS:  Class II     INDICATIONS:  vague dysphagia ; chronic bloating, weight loss     (fluctuating)          MEDICATIONS:   Fentanyl 50 mcg IV, Versed 5 mg IV     TOPICAL ANESTHETIC:  Exactacain Spray          DESCRIPTION OF PROCEDURE:   After the risks benefits and     alternatives of the procedure were thoroughly explained, informed     consent was obtained.  The LB [id number]-H180 [id number] endoscope was     introduced through the mouth and advanced to the second portion of     the duodenum, without limitations.  The instrument was slowly     withdrawn as the mucosa was fully examined.     <<PROCEDUREIMAGES>>          The upper, middle, and distal third of the esophagus were     carefully inspected and no abnormalities were noted. The z-line     was well seen at the GEJ. The endoscope was pushed into the fundus     which was normal including a retroflexed view. The antrum,gastric     body, first and second part of the duodenum were unremarkable.     Retroflexed views revealed no abnormalities.    The scope was then     withdrawn from the patient and the procedure completed.          COMPLICATIONS:  None          ENDOSCOPIC IMPRESSION:     1) Normal EGD     2) No cause for symptoms found          RECOMMENDATIONS:     1) RETURN TO THE CARE OF DR [name]          ______________________________  Wilhemina Bonito. Eda Keys, MD          CC:  Julieanne Manson, MD; The Patient          n.     eSIGNED:   Wilhemina Bonito. Eda Keys at 01/03/2011 02:35 PM          Arvella Merles, [id number]  Note: An exclamation mark (!) indicates a result that was not dispersed into the flowsheet. Document Creation Date: 01/03/2011 2:36 PM _______________________________________________________________________  (1) Order result status: Final Collection or observation date-time: 01/03/2011 14:25 Requested date-time:  Receipt date-time:  Reported date-time:  Referring Physician:   Ordering Physician: Fransico Setters [phone]) Specimen Source:  Source: Launa Grill Order Number: [id number] Lab site:

## 2011-01-09 NOTE — Progress Notes (Signed)
Summary: Call pt. with normal results  Phone Note Outgoing Call   Summary of Call: Please notify pt. of normal gastric emptying study and normal bloodwork. Await GI evaluation findings Initial call taken by: Julieanne Manson MD,  December 27, 2010 10:13 AM  Follow-up for Phone Call        Left message on answering machine for pt. to return call.  Dutch Quint RN  January 01, 2011 3:21 PM  Pt. notified of results.  Dutch Quint RN  January 01, 2011 4:43 PM

## 2011-03-16 NOTE — Assessment & Plan Note (Signed)
[facility]                         GASTROENTEROLOGY OFFICE NOTE   SHALAYA, SWAILES                     MRN:          [id number]  DATE:12/30/2006                            DOB:          May 29, 1946    REFERRING PHYSICIAN:  Lelon Perla, DO   PROBLEM:  1. For follow-up colonoscopy.  2. Abdominal bloating and gas.   HISTORY:  Twylla is a pleasant 65 year old African American female.  She has a history of hypothyroidism and is currently having some  difficulty apparently with hyperthyroid symptoms.  She has borderline  diabetes, hyperlipidemia and is status post remote hysterectomy.  She is  known to Dr. Marina Goodell from prior colonoscopy done in March of 2003.  This  was a negative exam with the exception of internal hemorrhoids.  Her  prep was fair.   The patient currently has no specific bowel complaints.  Her bowel  movements have been normal.  She has not noted any melena or  hematochezia.  She says that she has been under a lot of stress for the  past several months and has been having difficulty for about 2 months  with gas and bloating with everything I eat.  She is lactose  intolerant, generally avoids lactose.  She says that she started eating  yogurt which has helped a bit and also took a recent course of an  antibiotic per Dr. Laury Axon for other reasons and says that this seemed to  help her bloating somewhat as well.  She is not having any nausea or  vomiting.  No heartburn or indigestion.  She has chronic problems with  dysphagia to pills but says she has been that way all her life.   CURRENT MEDICATIONS:  1. Enalapril 10 mg daily.  2. HCTZ 25 mg daily.  3. Synthroid 88 mcg daily.  4. Baby aspirin daily.   ALLERGIES:  No known drug allergies.   PAST MEDICAL HISTORY:  As outlined above.   FAMILY HISTORY:  Negative for colon cancer or polyps.  Pertinent for  heart disease in her mother and grandmother and diabetes in mother  and  grandmother.   SOCIAL HISTORY:  The patient lives alone.  She has 3 children.  She is  employed as a Contractor.  She is a smoker, nondrinker.   REVIEW OF SYSTEMS:  Positive for some recent swollen nodes in her neck  improved post antibiotics, fatigue due to recent upper respiratory tract  infection, new anxiety, difficulty sleeping, arthritic symptoms and some  recent cough again attributed to the upper respiratory infection.  Otherwise negative review of systems.   PHYSICAL EXAMINATION:  Well-developed, thin African American female in  no acute distress.  Height is 5 foot 6.  Weight is 130.8.  Blood  pressure 110/62, pulse is 80.  HEENT:  Normocephalic and atraumatic.  EOMI.  PERRLA.  Sclerae  anicteric.  Neck is supple.  CARDIOVASCULAR:  Regular rate and rhythm with S1-S2.  No murmur, rub or  gallop.  PULMONARY:  Clear to A&P.  ABDOMEN:  Soft, nontender.  No palpable mass or hepatosplenomegaly.  Bowel sounds  active.  RECTAL:  Exam not done today.   IMPRESSION:  46. A 65 year old [place] American female referred for colon neoplasia      screening.  She is currently asymptomatic.  On review of her prior      colonoscopy, she had a fair to poor prep decreasing the confidence      level for visualization of small lesions.  Therefore, we will      proceed with follow-up colonoscopy at this time.  This was      discussed with the patient as well as the nature of the procedure      with the risks,benefits, and alternatives. She is agreeable.  2. Gas and bloating, probably irritable bowel syndrome related.  Rule      out bacterial overgrowth.   PLAN:  1. Schedule colonoscopy at her convenience.  2. Trial of Align 1 p.o. q.a.m. x1 month.  If this is beneficial, she      may continue.  She was given samples today.      Mike Gip, PA-C  Electronically Signed      Wilhemina Bonito. Marina Goodell, MD  Electronically Signed   AE/MedQ  DD: 12/30/2006  DT: 12/31/2006  Job #:  [id number]   cc:   Lelon Perla, DO

## 2011-06-28 ENCOUNTER — Other Ambulatory Visit (HOSPITAL_COMMUNITY): Payer: Self-pay | Admitting: Family Medicine

## 2011-06-28 DIAGNOSIS — R1907 Generalized intra-abdominal and pelvic swelling, mass and lump: Secondary | ICD-10-CM

## 2011-07-06 ENCOUNTER — Ambulatory Visit (HOSPITAL_COMMUNITY)
Admission: RE | Admit: 2011-07-06 | Discharge: 2011-07-06 | Disposition: A | Discharge status: Home / Self Care | Payer: Self-pay | Source: Ambulatory Visit | Attending: Family Medicine | Admitting: Family Medicine

## 2011-07-06 DIAGNOSIS — R1907 Generalized intra-abdominal and pelvic swelling, mass and lump: Secondary | ICD-10-CM

## 2011-07-06 DIAGNOSIS — K7689 Other specified diseases of liver: Secondary | ICD-10-CM | POA: Insufficient documentation

## 2011-07-06 DIAGNOSIS — R109 Unspecified abdominal pain: Secondary | ICD-10-CM | POA: Insufficient documentation

## 2011-07-06 DIAGNOSIS — K5900 Constipation, unspecified: Secondary | ICD-10-CM | POA: Insufficient documentation

## 2011-07-06 DIAGNOSIS — R1900 Intra-abdominal and pelvic swelling, mass and lump, unspecified site: Secondary | ICD-10-CM | POA: Insufficient documentation

## 2011-07-06 DIAGNOSIS — R197 Diarrhea, unspecified: Secondary | ICD-10-CM | POA: Insufficient documentation

## 2011-07-06 DIAGNOSIS — Q619 Cystic kidney disease, unspecified: Secondary | ICD-10-CM | POA: Insufficient documentation

## 2011-07-06 MED ORDER — IOHEXOL 300 MG/ML  SOLN
100.0000 mL | Freq: Once | INTRAMUSCULAR | Status: AC | PRN
  Administered 2011-07-06: 100 mL via INTRAVENOUS

## 2011-07-09 ENCOUNTER — Encounter: Payer: Self-pay | Admitting: Internal Medicine

## 2011-07-09 ENCOUNTER — Ambulatory Visit (INDEPENDENT_AMBULATORY_CARE_PROVIDER_SITE_OTHER): Payer: Self-pay | Admitting: Internal Medicine

## 2011-07-09 VITALS — BP 96/80 | HR 88 | Ht 66.25 in | Wt 137.0 lb

## 2011-07-09 DIAGNOSIS — R141 Gas pain: Secondary | ICD-10-CM

## 2011-07-09 DIAGNOSIS — R1310 Dysphagia, unspecified: Secondary | ICD-10-CM

## 2011-07-09 DIAGNOSIS — K589 Irritable bowel syndrome without diarrhea: Secondary | ICD-10-CM

## 2011-07-09 MED ORDER — METRONIDAZOLE 250 MG PO TABS: 250.0000 mg | ORAL_TABLET | Freq: Four times a day (QID) | ORAL | Status: AC

## 2011-07-09 NOTE — Progress Notes (Signed)
HISTORY OF PRESENT ILLNESS:  Kristin [name] is a 65 y.o. female with the below listed medical history who presents today with chief complaint of bloating. Patient has had this complaint for several years. Previous evaluations have been negative including abdominal ultrasound in December 2010, gastric emptying scan February 2012, upper endoscopy March 2012, and CT scan of the abdomen and pelvis just 3 days ago. Colonoscopy in April of 2008 was complete, but limited due to poor preparation. She does have COPD and continues to smoke. She again reports vague dysphagia. This has been present for years. She feel she needs to consume liquids to assist with swallowing solids. She has gained about 10 pounds since her last office visit in February of this year. She continues with alternating bowel habits.  REVIEW OF SYSTEMS:  All non-GI ROS negative except for redness, cough, shortness of breath, fatigue,  Past Medical History  Diagnosis Date  . Vitiligo   . Degeneration of cervical intervertebral disc   . Cervicalgia   . Unspecified vitamin D deficiency   . Esophageal reflux   . Personal history of peptic ulcer disease   . Acute sinusitis, unspecified   . Chest pain, unspecified   . COPD (chronic obstructive pulmonary disease)   . Obstructive chronic bronchitis with exacerbation   . Unspecified hypothyroidism   . Unspecified essential hypertension   . Hyperlipidemia     Past Surgical History  Procedure Date  . Abdominal hysterectomy w/ partial vaginactomy   . Thyroidectomy, partial   . Breast biopsy     Rt.  . Tonsillectomy     Social History Kristin [name]  reports that she has been smoking Cigarettes.  She has never used smokeless tobacco. She reports that she drinks alcohol. She reports that she does not use illicit drugs.  family history includes Diabetes in her mother; Heart attack in her mother; and Hypertension in her mother.  No Known Allergies     PHYSICAL  EXAMINATION: Vital signs: BP 96/80  Pulse 88  Ht 5' 6.25" (1.683 m)  Wt 137 lb (62.143 kg)  BMI 21.95 kg/m2 General: Well-developed, well-nourished, no acute distress HEENT: Sclerae are anicteric, conjunctiva pink. Oral mucosa intact Lungs: Clear Heart: Regular Abdomen: soft, nontender, nondistended, no obvious ascites, no peritoneal signs, normal bowel sounds. No organomegaly. Extremities: No edema Psychiatric: alert and oriented x3. Cooperative    ASSESSMENT:  #1. Chronic bloating discomfort. I suspect that this is in part related to her underlying lung disease. #2. Chronic IBS #3. Vague dysphagia without cause found   PLAN:  #1. Empiric trial of metronidazole 250 mg 4 times a day x10 days rule out a component of bacterial overgrowth #2. Stop smoking #3. Literature on gas provided for her review #4. Anti-gas and flatulence dietary she provided. She should follow this. #5. GI followup as needed #6. Return to [facility] clinic for her general medical needs

## 2011-07-09 NOTE — Patient Instructions (Signed)
Prescription has been sent to your pharmacy for you to pick up. Gas information and diet sheet given for you to read. Please stop smoking.

## 2011-08-24 ENCOUNTER — Other Ambulatory Visit: Payer: Self-pay | Admitting: Internal Medicine

## 2011-08-24 DIAGNOSIS — Z1231 Encounter for screening mammogram for malignant neoplasm of breast: Secondary | ICD-10-CM

## 2011-10-01 ENCOUNTER — Ambulatory Visit: Payer: Self-pay

## 2011-10-04 ENCOUNTER — Ambulatory Visit
Admission: RE | Admit: 2011-10-04 | Discharge: 2011-10-04 | Disposition: A | Discharge status: Home / Self Care | Payer: Self-pay | Source: Ambulatory Visit | Attending: Internal Medicine | Admitting: Internal Medicine

## 2011-10-04 DIAGNOSIS — Z1231 Encounter for screening mammogram for malignant neoplasm of breast: Secondary | ICD-10-CM

## 2011-12-05 ENCOUNTER — Encounter: Payer: Self-pay | Admitting: Internal Medicine

## 2011-12-06 ENCOUNTER — Telehealth: Payer: Self-pay | Admitting: Internal Medicine

## 2011-12-06 NOTE — Telephone Encounter (Signed)
Specific Prep for Recall Colon Procedure written on Recall Sheet in chart.

## 2012-01-01 ENCOUNTER — Other Ambulatory Visit: Payer: Self-pay | Admitting: Family Medicine

## 2012-01-01 ENCOUNTER — Other Ambulatory Visit (HOSPITAL_COMMUNITY)
Admission: RE | Admit: 2012-01-01 | Discharge: 2012-01-01 | Disposition: A | Discharge status: Home / Self Care | Payer: Self-pay | Source: Ambulatory Visit | Attending: Family Medicine | Admitting: Family Medicine

## 2012-01-01 DIAGNOSIS — Z01419 Encounter for gynecological examination (general) (routine) without abnormal findings: Secondary | ICD-10-CM | POA: Insufficient documentation

## 2012-01-10 ENCOUNTER — Encounter: Payer: Self-pay | Admitting: Internal Medicine

## 2012-02-20 ENCOUNTER — Ambulatory Visit (AMBULATORY_SURGERY_CENTER): Payer: Self-pay

## 2012-02-20 VITALS — Ht 66.5 in | Wt 137.3 lb

## 2012-02-20 DIAGNOSIS — Z1211 Encounter for screening for malignant neoplasm of colon: Secondary | ICD-10-CM

## 2012-02-20 MED ORDER — PEG 3350-KCL-NABCB-NACL-NASULF 240 G PO SOLR: 4000.0000 mL | Freq: Once | ORAL | Status: DC

## 2012-03-05 ENCOUNTER — Encounter: Payer: Self-pay | Admitting: Internal Medicine

## 2012-04-09 ENCOUNTER — Encounter: Payer: Self-pay | Admitting: Internal Medicine

## 2012-04-28 ENCOUNTER — Other Ambulatory Visit (HOSPITAL_COMMUNITY): Payer: Self-pay | Admitting: Internal Medicine

## 2012-04-28 DIAGNOSIS — R1310 Dysphagia, unspecified: Secondary | ICD-10-CM

## 2012-04-29 ENCOUNTER — Ambulatory Visit (HOSPITAL_COMMUNITY)
Admission: RE | Admit: 2012-04-29 | Discharge: 2012-04-29 | Disposition: A | Discharge status: Home / Self Care | Payer: Self-pay | Source: Ambulatory Visit | Attending: Internal Medicine | Admitting: Internal Medicine

## 2012-04-29 ENCOUNTER — Encounter (HOSPITAL_COMMUNITY): Payer: Self-pay

## 2012-04-29 DIAGNOSIS — M47812 Spondylosis without myelopathy or radiculopathy, cervical region: Secondary | ICD-10-CM | POA: Insufficient documentation

## 2012-04-29 DIAGNOSIS — R1310 Dysphagia, unspecified: Secondary | ICD-10-CM | POA: Insufficient documentation

## 2012-04-29 DIAGNOSIS — R220 Localized swelling, mass and lump, head: Secondary | ICD-10-CM | POA: Insufficient documentation

## 2012-04-29 DIAGNOSIS — E0789 Other specified disorders of thyroid: Secondary | ICD-10-CM | POA: Insufficient documentation

## 2012-04-29 MED ORDER — IOHEXOL 300 MG/ML  SOLN
75.0000 mL | Freq: Once | INTRAMUSCULAR | Status: AC | PRN
  Administered 2012-04-29: 75 mL via INTRAVENOUS

## 2012-05-05 ENCOUNTER — Ambulatory Visit (AMBULATORY_SURGERY_CENTER): Payer: Self-pay | Admitting: *Deleted

## 2012-05-05 VITALS — Ht 66.0 in | Wt 122.4 lb

## 2012-05-05 DIAGNOSIS — Z1211 Encounter for screening for malignant neoplasm of colon: Secondary | ICD-10-CM

## 2012-05-05 MED ORDER — MOVIPREP 100 G PO SOLR: ORAL | Status: DC

## 2012-05-19 ENCOUNTER — Ambulatory Visit (AMBULATORY_SURGERY_CENTER): Payer: Self-pay | Admitting: Internal Medicine

## 2012-05-19 ENCOUNTER — Encounter: Payer: Self-pay | Admitting: Internal Medicine

## 2012-05-19 ENCOUNTER — Telehealth: Payer: Self-pay | Admitting: *Deleted

## 2012-05-19 VITALS — BP 112/62 | HR 100 | Temp 97.1°F | Resp 98 | Ht 66.0 in | Wt 122.0 lb

## 2012-05-19 DIAGNOSIS — Z1211 Encounter for screening for malignant neoplasm of colon: Secondary | ICD-10-CM

## 2012-05-19 HISTORY — PX: COLONOSCOPY: SHX174

## 2012-05-19 MED ORDER — FLEET ENEMA 7-19 GM/118ML RE ENEM: 1.0000 | ENEMA | Freq: Once | RECTAL | Status: DC

## 2012-05-19 MED ORDER — SODIUM CHLORIDE 0.9 % IV SOLN: 500.0000 mL | INTRAVENOUS | Status: DC

## 2012-05-19 NOTE — Telephone Encounter (Signed)
Patient states that she is feeling very sick after drinking the prep yesterday She only was able to drink 3/4 of the bottle.  She is unable to drink the second bottle this morning due to feeling sick.  She would like to know if there is something else we could do so she can have the colonoscopy today.  She is diet controlled diabetic and she states that her stool is cloudy this morning.  Please advise on what she needs to do.

## 2012-05-19 NOTE — Telephone Encounter (Signed)
Spoke with Dr. Marina Goodell who states that she can have Reglan 10mg  po sent into pharmacy, suck on hard candy and to try to finish as much of the Moviprep as possible.   Spoke with pt at 9:30.  She states, "the prep is just too sweet."   She doesn't have hard candy at home.  She states she will not need the medication sent to the pharmacy; she will sip on Pepsi and a popsicle and get as much prep down as she can.

## 2012-05-19 NOTE — Patient Instructions (Addendum)
YOU HAD AN ENDOSCOPIC PROCEDURE TODAY AT THE [facility]: Refer to the procedure report that was given to you for any specific questions about what was found during the examination.  If the procedure report does not answer your questions, please call your gastroenterologist to clarify.  If you requested that your care partner not be given the details of your procedure findings, then the procedure report has been included in a sealed envelope for you to review at your convenience later.  YOU SHOULD EXPECT: Some feelings of bloating in the abdomen. Passage of more gas than usual.  Walking can help get rid of the air that was put into your GI tract during the procedure and reduce the bloating. If you had a lower endoscopy (such as a colonoscopy or flexible sigmoidoscopy) you may notice spotting of blood in your stool or on the toilet paper. If you underwent a bowel prep for your procedure, then you may not have a normal bowel movement for a few days.  DIET: Your first meal following the procedure should be a light meal and then it is ok to progress to your normal diet.  A half-sandwich or bowl of soup is an example of a good first meal.  Heavy or fried foods are harder to digest and may make you feel nauseous or bloated.  Likewise meals heavy in dairy and vegetables can cause extra gas to form and this can also increase the bloating.  Drink plenty of fluids but you should avoid alcoholic beverages for 24 hours.  ACTIVITY: Your care partner should take you home directly after the procedure.  You should plan to take it easy, moving slowly for the rest of the day.  You can resume normal activity the day after the procedure however you should NOT DRIVE or use heavy machinery for 24 hours (because of the sedation medicines used during the test).    SYMPTOMS TO REPORT IMMEDIATELY: A gastroenterologist can be reached at any hour.  During normal business hours, 8:30 AM to 5:00 PM Monday through Friday,  call [phone].  After hours and on weekends, please call the GI answering service at [phone] who will take a message and have the physician on call contact you.   Following lower endoscopy (colonoscopy or flexible sigmoidoscopy):  Excessive amounts of blood in the stool  Significant tenderness or worsening of abdominal pains  Swelling of the abdomen that is new, acute  Fever of 100F or higher: If any biopsies were taken you will be contacted by phone or by letter within the next 1-3 weeks.  Call your gastroenterologist if you have not heard about the biopsies in 3 weeks.  Our staff will call the home number listed on your records the next business day following your procedure to check on you and address any  questions or concerns that you may have at that time regarding the information given to you following your procedure. This is a courtesy call and so if there is no answer at the home number and we have not heard from you through the emergency physician on call, we will assume that you have returned to your regular daily activities without incident.  SIGNATURES/CONFIDENTIALITY: You and/or your care partner have signed paperwork which will be entered into your electronic medical record.  These signatures attest to the fact that that the information above on your After Visit Summary has been reviewed and is understood.  Full responsibility of the confidentiality of this discharge information  lies with you and/or your care-partner.   Dr. Lamar Sprinkles office will arrange an "air contrast barium enema" to further examine colon because of poor colon prep-limited exam.

## 2012-05-19 NOTE — Op Note (Signed)
[facility] [address] [place], [place]  [address]  COLONOSCOPY PROCEDURE REPORT  PATIENT:  Kristin [name], Kristin [name]  MR#:  [id number] BIRTHDATE:  October 14, 1946, 66 yrs. old  GENDER:  female ENDOSCOPIST:  Wilhemina Bonito. Eda Keys, MD REF. BY:  Screening Recall, M.D. PROCEDURE DATE:  05/19/2012 PROCEDURE:  Average-risk screening colonoscopy G0121 ASA CLASS:  Class II INDICATIONS:  Screening ; index exam 01-2007 w/ poor prep. Unable to finish prep this time as well... enemas given MEDICATIONS:   MAC sedation, administered by CRNA, propofol (Diprivan) 210 mg IV  DESCRIPTION OF PROCEDURE:   After the risks benefits and alternatives of the procedure were thoroughly explained, informed consent was obtained.  Digital rectal exam was performed and revealed no abnormalities.   The LB CF-[id number] endoscope was introduced through the anus and advanced to the cecum, which was identified by both the appendix and ileocecal valve, limited by poor preparation.    The quality of the prep was poor, using MoviPrep.  The instrument was then slowly withdrawn as the colon was fully examined. <<PROCEDUREIMAGES>>  FINDINGS: Complete exam to cecum.  Poor prep limited this examination for lesions other than large masses..   Retroflexed views in the rectum revealed no abnormalities.    The time to cecum =  4:38  minutes. The scope was then withdrawn in  4:54 minutes from the cecum and the procedure completed.  COMPLICATIONS:  None  ENDOSCOPIC IMPRESSION: 1) Poor prep (exam limited)  RECOMMENDATIONS: 1) My office will arrange to you to have an "air contrast barium enema" performed (poor prep colonoscopy r/o lesions"). This is a radiology test to further examine your colon.  ______________________________ Wilhemina Bonito. Eda Keys, MD  CC:  Yisroel Ramming, MD;   The Patient  n. eSIGNED:   Wilhemina Bonito. Eda Keys at 05/19/2012 02:46 PM  Arvella Merles, [phone]

## 2012-05-19 NOTE — Progress Notes (Addendum)
Albuterol inhaler in her bag  Pt given 1 fleets enema.  Results- light brown  With some sediment noted.  Dr. Marina Goodell aware.and no new orders

## 2012-05-19 NOTE — Progress Notes (Signed)
Patient did not experience any of the following events: a burn prior to discharge; a fall within the facility; wrong site/side/patient/procedure/implant event; or a hospital transfer or hospital admission upon discharge from the facility. (G8907) Patient did not have preoperative order for IV antibiotic SSI prophylaxis. (G8918)  

## 2012-05-20 ENCOUNTER — Telehealth: Payer: Self-pay

## 2012-05-20 ENCOUNTER — Telehealth: Payer: Self-pay | Admitting: *Deleted

## 2012-05-20 ENCOUNTER — Other Ambulatory Visit: Payer: Self-pay | Admitting: Internal Medicine

## 2012-05-20 DIAGNOSIS — R933 Abnormal findings on diagnostic imaging of other parts of digestive tract: Secondary | ICD-10-CM

## 2012-05-20 NOTE — Telephone Encounter (Signed)
  Follow up Call-  Call back number 05/19/2012  Post procedure Call Back phone  # [phone]  Permission to leave phone message No  comments doesnt have a message     Patient questions:  No answer;  Pt. Doesn't have a machine to leave msg.

## 2012-05-20 NOTE — Telephone Encounter (Signed)
Pt scheduled for air contrast barium enema for poor prep colon r/o lesions 05/27/12@[facility] . Pt to arrive at 9:15am for a 9:30am appt. Pt to pick up prep kit Friday from [facility]. Pt aware of appt dates and times.

## 2012-05-27 ENCOUNTER — Ambulatory Visit (HOSPITAL_COMMUNITY)
Admission: RE | Admit: 2012-05-27 | Discharge: 2012-05-27 | Disposition: A | Discharge status: Home / Self Care | Payer: Self-pay | Source: Ambulatory Visit | Attending: Internal Medicine | Admitting: Internal Medicine

## 2012-05-27 DIAGNOSIS — R933 Abnormal findings on diagnostic imaging of other parts of digestive tract: Secondary | ICD-10-CM

## 2012-05-27 DIAGNOSIS — K5730 Diverticulosis of large intestine without perforation or abscess without bleeding: Secondary | ICD-10-CM | POA: Insufficient documentation

## 2012-05-27 DIAGNOSIS — Q438 Other specified congenital malformations of intestine: Secondary | ICD-10-CM | POA: Insufficient documentation

## 2013-01-08 ENCOUNTER — Other Ambulatory Visit: Payer: Self-pay

## 2013-02-13 ENCOUNTER — Ambulatory Visit
Admission: RE | Admit: 2013-02-13 | Discharge: 2013-02-13 | Disposition: A | Discharge status: Home / Self Care | Payer: Medicare Other | Source: Ambulatory Visit

## 2013-02-13 DIAGNOSIS — Z1231 Encounter for screening mammogram for malignant neoplasm of breast: Secondary | ICD-10-CM

## 2013-03-09 DIAGNOSIS — R1084 Generalized abdominal pain: Secondary | ICD-10-CM | POA: Diagnosis not present

## 2013-03-09 DIAGNOSIS — R5381 Other malaise: Secondary | ICD-10-CM | POA: Diagnosis not present

## 2013-03-09 DIAGNOSIS — I10 Essential (primary) hypertension: Secondary | ICD-10-CM | POA: Diagnosis not present

## 2013-03-09 DIAGNOSIS — E785 Hyperlipidemia, unspecified: Secondary | ICD-10-CM | POA: Diagnosis not present

## 2013-03-09 DIAGNOSIS — J4489 Other specified chronic obstructive pulmonary disease: Secondary | ICD-10-CM | POA: Diagnosis not present

## 2013-03-09 DIAGNOSIS — J438 Other emphysema: Secondary | ICD-10-CM | POA: Diagnosis not present

## 2013-03-09 DIAGNOSIS — J449 Chronic obstructive pulmonary disease, unspecified: Secondary | ICD-10-CM | POA: Diagnosis not present

## 2013-03-09 DIAGNOSIS — E039 Hypothyroidism, unspecified: Secondary | ICD-10-CM | POA: Diagnosis not present

## 2013-03-17 DIAGNOSIS — J9819 Other pulmonary collapse: Secondary | ICD-10-CM | POA: Diagnosis not present

## 2013-03-17 DIAGNOSIS — J439 Emphysema, unspecified: Secondary | ICD-10-CM | POA: Diagnosis not present

## 2013-03-20 ENCOUNTER — Ambulatory Visit (INDEPENDENT_AMBULATORY_CARE_PROVIDER_SITE_OTHER): Payer: Medicare Other | Admitting: Internal Medicine

## 2013-03-20 ENCOUNTER — Encounter: Payer: Self-pay | Admitting: Internal Medicine

## 2013-03-20 VITALS — BP 104/60 | HR 105 | Temp 97.1°F | Ht 65.5 in | Wt 124.8 lb

## 2013-03-20 DIAGNOSIS — I10 Essential (primary) hypertension: Secondary | ICD-10-CM

## 2013-03-20 DIAGNOSIS — F17200 Nicotine dependence, unspecified, uncomplicated: Secondary | ICD-10-CM

## 2013-03-20 DIAGNOSIS — J449 Chronic obstructive pulmonary disease, unspecified: Secondary | ICD-10-CM

## 2013-03-20 MED ORDER — TELMISARTAN 80 MG PO TABS: 80.0000 mg | ORAL_TABLET | Freq: Every day | ORAL | Status: DC

## 2013-03-20 NOTE — Assessment & Plan Note (Signed)
ACE inhibitors are problematic in  pts with airway complaints because  even experienced pulmonologists can't always distinguish ace effects from copd/asthma.  By themselves they don't actually cause a problem, much like oxygen can't by itself start a fire, but they certainly serve as a powerful catalyst or enhancer for any "fire"  or inflammatory process in the upper airway, be it caused by an ET  tube or more commonly reflux (especially in the obese or pts with known GERD or who are on biphoshonates).    In the era of ARB near equivalency(at least in the short run)  until we have a better handle on the reversibility of the airway problem, it just makes sense to avoid ACEI  entirely in the short run and then decide later, having established a level of airway control using a reasonable limited regimen, whether to add back ace but even then being very careful to observe the pt for worsening airway control and number of meds used/ needed to control symptoms.

## 2013-03-20 NOTE — Patient Instructions (Addendum)
The key is to stop smoking completely before smoking completely stops you!   Stop accupril and start micardis 80 mg one half daily plus continue the hctz  Please schedule a follow up office visit in 6 weeks, call sooner if needed with pfts

## 2013-03-20 NOTE — Assessment & Plan Note (Signed)
I took an extended  opportunity with this patient to outline the consequences of continued cigarette use  in airway disorders based on all the data we have from the multiple national lung health studies (perfomed over decades at millions of dollars in cost)  indicating that smoking cessation, not choice of inhalers or physicians, is the most important aspect of care.   

## 2013-03-20 NOTE — Assessment & Plan Note (Signed)
-   PFTs 06/09/07 FEV1  1.14 (45%) ratio 44 and no better p B2 and DLCO 33 corrects to 67% - Trial off Acei 03/20/2013   DDX of  difficult airways managment all start with A and  include Adherence, Ace Inhibitors, Acid Reflux, Active Sinus Disease, Alpha 1 Antitripsin deficiency, Anxiety masquerading as Airways dz,  ABPA,  allergy(esp in young), Aspiration (esp in elderly), Adverse effects of DPI,  Active smokers, plus two Bs  = Bronchiectasis and Beta blocker use..and one C= CHF   Acei top of list of usual suspects here based on assoc h/o dry cough/ dysphagia and sense of throat congestion but no excess mucus, even in am  Active smoking is second > discussed separately

## 2013-03-20 NOTE — Progress Notes (Signed)
  Subjective:    Patient ID: Kristin [name], female    DOB: January 11, 1946  MRN: [id number]  HPI  53 yobf smoker with emphysema by ct referred by Dr Laury Axon 03/20/2013 to pulmonary clinic.   03/20/2013 1st pulmonary eval/ Kristin [name] on ACEi cc indolent onset doe worse in hot or cold x 6 years not really progressing assoc with variable cough/ choking/ dysphagia with neg GI w/u and sense of throat congestion but no excess mucus production even in am,  saba helps breathing some and avg use about twice daily.  No obvious daytime variabilty(unless extreme heat/cold)  or assoc   cp or chest tightness, subjective wheeze overt sinus or hb symptoms. No unusual exp hx or h/o childhood pna/ asthma or premature birth to her knowledge.   Sleeping ok without nocturnal  or early am exacerbation  of respiratory  c/o's or need for noct saba. Also denies any obvious fluctuation of symptoms with weather or environmental changes or other aggravating or alleviating factors except as outlined above   Review of Systems  Constitutional: Positive for unexpected weight change. Negative for fever and chills.  HENT: Positive for ear pain and trouble swallowing. Negative for nosebleeds, congestion, sore throat, rhinorrhea, sneezing, dental problem, voice change, postnasal drip and sinus pressure.   Eyes: Negative for visual disturbance.  Respiratory: Positive for cough and shortness of breath. Negative for choking.   Cardiovascular: Negative for chest pain and leg swelling.  Gastrointestinal: Negative for vomiting, abdominal pain and diarrhea.       Acid Heartburn Indigestion  Genitourinary: Negative for difficulty urinating.  Musculoskeletal: Positive for arthralgias.  Skin: Negative for rash.  Neurological: Positive for headaches. Negative for tremors and syncope.  Hematological: Does not bruise/bleed easily.       Objective:   Physical Exam  amb bf nad  Wt Readings from Last 3 Encounters:  03/20/13 124 lb 12.8 oz  (56.609 kg)  05/19/12 122 lb (55.339 kg)  05/05/12 122 lb 6.4 oz (55.52 kg)    HEENT mild turbinate edema.  Oropharynx no thrush or excess pnd or cobblestoning.  No JVD or cervical adenopathy. Mild accessory muscle hypertrophy. Trachea midline, nl thryroid. Chest was hyperinflated by percussion with diminished breath sounds and moderate increased exp time without wheeze. Hoover sign positive at mid inspiration. Regular rate and rhythm without murmur gallop or rub or increase P2 or edema.  Abd: no hsm, nl excursion. Ext warm without cyanosis or clubbing.    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

## 2013-03-27 ENCOUNTER — Encounter: Payer: Self-pay | Admitting: Internal Medicine

## 2013-05-05 ENCOUNTER — Ambulatory Visit (INDEPENDENT_AMBULATORY_CARE_PROVIDER_SITE_OTHER): Payer: Medicare Other | Admitting: Internal Medicine

## 2013-05-05 ENCOUNTER — Encounter: Payer: Self-pay | Admitting: Internal Medicine

## 2013-05-05 VITALS — BP 110/70 | HR 90 | Temp 97.7°F | Ht 66.0 in | Wt 127.0 lb

## 2013-05-05 DIAGNOSIS — J449 Chronic obstructive pulmonary disease, unspecified: Secondary | ICD-10-CM

## 2013-05-05 DIAGNOSIS — F17200 Nicotine dependence, unspecified, uncomplicated: Secondary | ICD-10-CM | POA: Diagnosis not present

## 2013-05-05 LAB — PULMONARY FUNCTION TEST

## 2013-05-05 MED ORDER — TELMISARTAN 80 MG PO TABS: 40.0000 mg | ORAL_TABLET | Freq: Every day | ORAL | Status: DC

## 2013-05-05 NOTE — Progress Notes (Signed)
PFT done today. 

## 2013-05-05 NOTE — Progress Notes (Signed)
  Subjective:    Patient ID: Kristin [name], female    DOB: [birth year]  MRN: [id number]   Brief patient profile:  62 yobf smoker with emphysema by ct referred by Dr Laury Axon 03/20/2013 to pulmonary clinic.   03/20/2013 1st pulmonary eval/ Arien Morine on ACEi cc indolent onset doe worse in hot or cold x 6 years not really progressing assoc with variable cough/ choking/ dysphagia with neg GI w/u and sense of throat congestion but no excess mucus production even in am,  saba helps breathing some and avg use about twice daily. rec The key is to stop smoking completely before smoking completely stops you!  Stop accupril and start micardis 80 mg one half daily plus continue the hctz Please schedule a follow up office visit in 6 weeks, call sooner if needed with pfts      05/05/2013 f/u ov/Richanda Darin re copd GOLD III Chief Complaint  Patient presents with  . Followup with PFT    Pt states that her breathing has slightly improved since her last visit. No new co's today.    doe x grocery goes slow flat ok, try to nl pace or uphills then tires out quickly, rare perceived need for saba    No obvious daytime variabilty(unless extreme heat/cold)  or assoc cough  cp or chest tightness, subjective wheeze overt sinus or hb symptoms. No unusual exp hx or h/o childhood pna/ asthma or premature birth to her knowledge.   Sleeping ok without nocturnal  or early am exacerbation  of respiratory  c/o's or need for noct saba. Also denies any obvious fluctuation of symptoms with weather or environmental changes or other aggravating or alleviating factors except as outlined above    Current Medications, Allergies, Past Medical History, Past Surgical History, Family History, and Social History were reviewed in [employer] record.  ROS  The following are not active complaints unless bolded sore throat, dysphagia, dental problems, itching, sneezing,  nasal congestion or excess/ purulent secretions, ear ache,    fever, chills, sweats, unintended wt loss, pleuritic or exertional cp, hemoptysis,  orthopnea pnd or leg swelling, presyncope, palpitations, heartburn, abdominal pain, anorexia, nausea, vomiting, diarrhea  or change in bowel or urinary habits, change in stools or urine, dysuria,hematuria,  rash, arthralgias, visual complaints, headache, numbness weakness or ataxia or problems with walking or coordination,  change in mood/affect or memory.          Objective:   Physical Exam  amb bf nad  05/05/2013       127  Wt Readings from Last 3 Encounters:  03/20/13 124 lb 12.8 oz (56.609 kg)  05/19/12 122 lb (55.339 kg)  05/05/12 122 lb 6.4 oz (55.52 kg)    HEENT mild turbinate edema.  Oropharynx no thrush or excess pnd or cobblestoning.  No JVD or cervical adenopathy. Mild accessory muscle hypertrophy. Trachea midline, nl thryroid. Chest was hyperinflated by percussion with diminished breath sounds and moderate increased exp time without wheeze. Hoover sign positive at mid inspiration. Regular rate and rhythm without murmur gallop or rub or increase P2 or edema.  Abd: no hsm, nl excursion. Ext warm without cyanosis or clubbing.      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

## 2013-05-05 NOTE — Patient Instructions (Addendum)
The key is to stop smoking completely before smoking completely stops you!   If your condition worsens to point where you need albuterol more than twice daily return to see Tammy

## 2013-05-06 ENCOUNTER — Telehealth: Payer: Self-pay | Admitting: Internal Medicine

## 2013-05-06 MED ORDER — TELMISARTAN 40 MG PO TABS: 40.0000 mg | ORAL_TABLET | Freq: Every day | ORAL | Status: DC

## 2013-05-06 NOTE — Assessment & Plan Note (Signed)
>   3 min I reviewed the Flethcher curve with patient that basically indicates  if you quit smoking when your best day FEV1 is still well preserved it is highly unlikely you will progress to severe disease and informed the patient there was no medication on the market that has proven to change the curve or the likelihood of progression.  Therefore stopping smoking and maintaining abstinence is the most important aspect of care, not choice of inhalers or for that matter, doctors.    Pulmonary f/u is therefore prn

## 2013-05-06 NOTE — Assessment & Plan Note (Signed)
-   PFTs 06/09/07 FEV1  1.14 (45%) ratio 44 and no better p B2 and DLCO 33 corrects to 67% - PFTs 05/05/2013  FEV1 0.85 (34%) ratio 62 and 17% better FVC p B2 DLCO 28 corrects 67% - Trial off Acei 03/20/2013  - hfa 25% 05/05/2013   Improved symptoms off acei and predominantly emphysematous features for which LAMA would be best choice but this point just using occ albuterol ineffectively so no reason to make things more complicated  The main focus here should be to help her stop smoking to prevent further progression, not to "smooth out the ride" on her descent to endstage dz. See smoking discussed separately.

## 2013-05-06 NOTE — Telephone Encounter (Signed)
Spoke with pharmacist and verified that per MW pt is to stop Accupril and start Micardis 40 mg daily.  Rx for Micardis 40 mg sent.

## 2013-05-15 ENCOUNTER — Encounter: Payer: Self-pay | Admitting: Internal Medicine

## 2013-07-19 DIAGNOSIS — Z23 Encounter for immunization: Secondary | ICD-10-CM | POA: Diagnosis not present

## 2013-08-11 DIAGNOSIS — E119 Type 2 diabetes mellitus without complications: Secondary | ICD-10-CM | POA: Diagnosis not present

## 2013-08-17 ENCOUNTER — Telehealth: Payer: Self-pay | Admitting: Internal Medicine

## 2013-08-17 MED ORDER — TELMISARTAN 40 MG PO TABS: 40.0000 mg | ORAL_TABLET | Freq: Every day | ORAL | Status: DC

## 2013-08-17 NOTE — Telephone Encounter (Signed)
I spoke with pt. She is aware RX sent to [facility]. Nothing further needed

## 2013-09-03 DIAGNOSIS — E039 Hypothyroidism, unspecified: Secondary | ICD-10-CM | POA: Diagnosis not present

## 2013-09-03 DIAGNOSIS — J449 Chronic obstructive pulmonary disease, unspecified: Secondary | ICD-10-CM | POA: Diagnosis not present

## 2013-09-03 DIAGNOSIS — Z808 Family history of malignant neoplasm of other organs or systems: Secondary | ICD-10-CM | POA: Diagnosis not present

## 2013-09-03 DIAGNOSIS — E785 Hyperlipidemia, unspecified: Secondary | ICD-10-CM | POA: Diagnosis not present

## 2013-09-03 DIAGNOSIS — R5381 Other malaise: Secondary | ICD-10-CM | POA: Diagnosis not present

## 2013-09-03 DIAGNOSIS — E119 Type 2 diabetes mellitus without complications: Secondary | ICD-10-CM | POA: Diagnosis not present

## 2013-09-03 DIAGNOSIS — I10 Essential (primary) hypertension: Secondary | ICD-10-CM | POA: Diagnosis not present

## 2013-09-03 DIAGNOSIS — J438 Other emphysema: Secondary | ICD-10-CM | POA: Diagnosis not present

## 2013-09-29 ENCOUNTER — Ambulatory Visit
Admission: RE | Admit: 2013-09-29 | Discharge: 2013-09-29 | Disposition: A | Discharge status: Home / Self Care | Payer: Medicare Other | Source: Ambulatory Visit | Attending: Otolaryngology | Admitting: Otolaryngology

## 2013-09-29 ENCOUNTER — Other Ambulatory Visit: Payer: Self-pay | Admitting: Otolaryngology

## 2013-09-29 DIAGNOSIS — F17200 Nicotine dependence, unspecified, uncomplicated: Secondary | ICD-10-CM | POA: Diagnosis not present

## 2013-09-29 DIAGNOSIS — R1310 Dysphagia, unspecified: Secondary | ICD-10-CM | POA: Diagnosis not present

## 2014-01-20 ENCOUNTER — Other Ambulatory Visit: Payer: Self-pay

## 2014-01-20 DIAGNOSIS — Z1231 Encounter for screening mammogram for malignant neoplasm of breast: Secondary | ICD-10-CM

## 2014-02-08 DIAGNOSIS — J449 Chronic obstructive pulmonary disease, unspecified: Secondary | ICD-10-CM | POA: Diagnosis not present

## 2014-02-08 DIAGNOSIS — I10 Essential (primary) hypertension: Secondary | ICD-10-CM | POA: Diagnosis not present

## 2014-02-08 DIAGNOSIS — E039 Hypothyroidism, unspecified: Secondary | ICD-10-CM | POA: Diagnosis not present

## 2014-02-08 DIAGNOSIS — E785 Hyperlipidemia, unspecified: Secondary | ICD-10-CM | POA: Diagnosis not present

## 2014-02-15 ENCOUNTER — Ambulatory Visit
Admission: RE | Admit: 2014-02-15 | Discharge: 2014-02-15 | Disposition: A | Discharge status: Home / Self Care | Payer: Medicare Other | Source: Ambulatory Visit

## 2014-02-15 DIAGNOSIS — Z1231 Encounter for screening mammogram for malignant neoplasm of breast: Secondary | ICD-10-CM | POA: Diagnosis not present

## 2014-02-16 ENCOUNTER — Encounter: Payer: Self-pay | Admitting: *Deleted

## 2014-02-18 ENCOUNTER — Encounter: Payer: Self-pay | Admitting: Nurse Practitioner

## 2014-02-18 ENCOUNTER — Other Ambulatory Visit (HOSPITAL_COMMUNITY): Payer: Self-pay | Admitting: Internal Medicine

## 2014-02-18 ENCOUNTER — Other Ambulatory Visit (HOSPITAL_COMMUNITY): Payer: Self-pay | Admitting: *Deleted

## 2014-02-18 ENCOUNTER — Ambulatory Visit (INDEPENDENT_AMBULATORY_CARE_PROVIDER_SITE_OTHER): Payer: Medicare Other | Admitting: Nurse Practitioner

## 2014-02-18 VITALS — BP 92/52 | HR 100 | Wt 113.2 lb

## 2014-02-18 DIAGNOSIS — R1310 Dysphagia, unspecified: Secondary | ICD-10-CM

## 2014-02-18 DIAGNOSIS — R1313 Dysphagia, pharyngeal phase: Secondary | ICD-10-CM | POA: Diagnosis not present

## 2014-02-18 NOTE — Progress Notes (Signed)
Agree with initial assessment and plans. May need a neuro evaluation

## 2014-02-18 NOTE — Progress Notes (Signed)
     History of Present Illness:  Patient is a 68 year old female evaluated in 2009 and 2012 by Dr. Henrene Pastor for chronic bloating. Previous evaluations have been negative including abdominal ultrasound in December 2010, gastric emptying scan February 2012, upper endoscopy March 2012, and CT scan of the abdomen and pelvis 2012. Colonoscopy in April of 2008 was normal limited by poor preparation but no gross abnormalities seen.   Patient is here today for evaluation of dysphagia. Swallowing problems began about five years ago. It is difficult to transfer bolus of food from mouth to pharynx. She doesn't really feel like food gets stuck in esophagus. Often tucks chin to facilitate passage of food. She will not eat meat, living off soups, yogurt and similar foods. Reports slow weight loss of 10 pounds over last 3 years. No odynophagia. Mouth not particularly dry. No reflux symptoms. Neck CTscan July 2013 non-diagnostic.    Current Medications, Allergies, Past Medical History, Past Surgical History, Family History and Social History were reviewed in [employer] record.  Physical Exam: General: Pleasant, thin black female in no acute distress Head: Normocephalic and atraumatic Eyes:  sclerae anicteric, conjunctiva pink  Mouth: no obvious lesions Ears: Normal auditory acuity Lungs: Clear throughout to auscultation Heart: Regular rate and rhythm Abdomen: Soft, non distended, non-tender. No masses, no hepatomegaly. Normal bowel sounds Musculoskeletal: Symmetrical with no gross deformities  Extremities: No edema  Neurological: Alert oriented x 4, grossly nonfocal Psychological:  Alert and cooperative. Normal mood and affect  Assessment and Recommendations:   68 year old female with 5 year history of dysphagia. Describes transfer dysphagia with difficulty transferring bolus of food into pharynx. Denies xerostomia. Denies any neurologic symptoms. Chin tuck facilitates swallowing.  For further patient will be scheduled for a modified barium swallowing study. Of note, patient had unremarkable EGD in 2012 at which time she had some vague dysphagia complaints. Will call her with test results.

## 2014-02-18 NOTE — Patient Instructions (Addendum)
You have been given a separate informational sheet regarding your tobacco use, the importance of quitting and local resources to help you quit.  You have been scheduled for a Barium Esophogram at [facility] (1st floor of the hospital) on 02-24-2014 at 1 pm. Please arrive 15 minutes prior to your appointment for registration. Make certain not to have anything to eat or drink 6 hours prior to your test. If you need to reschedule for any reason, please contact radiology at [phone] to do so. __________________________________________________________________ A barium swallow is an examination that concentrates on views of the esophagus. This tends to be a double contrast exam (barium and two liquids which, when combined, create a gas to distend the wall of the oesophagus) or single contrast (non-ionic iodine based). The study is usually tailored to your symptoms so a good history is essential. Attention is paid during the study to the form, structure and configuration of the esophagus, looking for functional disorders (such as aspiration, dysphagia, achalasia, motility and reflux) EXAMINATION You may be asked to change into a gown, depending on the type of swallow being performed. A radiologist and radiographer will perform the procedure. The radiologist will advise you of the type of contrast selected for your procedure and direct you during the exam. You will be asked to stand, sit or lie in several different positions and to hold a small amount of fluid in your mouth before being asked to swallow while the imaging is performed .In some instances you may be asked to swallow barium coated marshmallows to assess the motility of a solid food bolus. The exam can be recorded as a digital or video fluoroscopy procedure. POST PROCEDURE It will take 1-2 days for the barium to pass through your system. To facilitate this, it is important, unless otherwise directed, to increase your fluids for the next  24-48hrs and to resume your normal diet.  This test typically takes about 30 minutes to perform. __________________________________________________________________________________

## 2014-02-20 ENCOUNTER — Encounter: Payer: Self-pay | Admitting: *Deleted

## 2014-02-24 ENCOUNTER — Ambulatory Visit (HOSPITAL_COMMUNITY): Payer: Medicare Other

## 2014-02-24 ENCOUNTER — Other Ambulatory Visit (HOSPITAL_COMMUNITY): Payer: Medicare Other

## 2014-02-26 ENCOUNTER — Ambulatory Visit (HOSPITAL_COMMUNITY)
Admission: RE | Admit: 2014-02-26 | Discharge: 2014-02-26 | Disposition: A | Discharge status: Home / Self Care | Payer: Medicare Other | Source: Ambulatory Visit | Attending: Internal Medicine | Admitting: Internal Medicine

## 2014-02-26 DIAGNOSIS — R1313 Dysphagia, pharyngeal phase: Secondary | ICD-10-CM

## 2014-02-26 DIAGNOSIS — R1310 Dysphagia, unspecified: Secondary | ICD-10-CM

## 2014-02-26 NOTE — Procedures (Signed)
Objective Swallowing Evaluation: Modified Barium Swallowing Study  Patient Details  Name: Kristin [name] MRN: [id number] Date of Birth: [birth year]  Today's Date: 02/26/2014 Time: 5456-2563 SLP Time Calculation (min): 33 min  Past Medical History:  Past Medical History  Diagnosis Date  . Vitiligo   . Degeneration of cervical intervertebral disc   . Cervicalgia   . Unspecified vitamin D deficiency   . Esophageal reflux   . Personal history of peptic ulcer disease   . Acute sinusitis, unspecified   . Chest pain, unspecified   . COPD (chronic obstructive pulmonary disease)   . Obstructive chronic bronchitis with exacerbation   . Unspecified hypothyroidism   . Unspecified essential hypertension   . Hyperlipidemia   . Diverticulosis of colon (without mention of hemorrhage)    Past Surgical History:  Past Surgical History  Procedure Laterality Date  . Abdominal hysterectomy w/ partial vaginactomy    . Thyroidectomy, partial    . Breast biopsy Right   . Tonsillectomy    . Colonoscopy  05/19/2012  . Esophagogastroduodenoscopy  01/03/2011    normal    HPI:  Patient is a 68 year old female evaluated in 2009 and 2012 by Dr. Henrene Pastor for chronic bloating. Previous evaluations have been negative including abdominal ultrasound in December 2010, gastric emptying scan February 2012, upper endoscopy March 2012, and CT scan of the abdomen and pelvis 2012. Colonoscopy in April of 2008 was normal limited by poor preparation but no gross abnormalities seen.  Patient is here today for evaluation of dysphagia. Swallowing problems began about five years ago. It is difficult to transfer bolus of food from mouth to pharynx. She doesn't really feel like food gets stuck in esophagus. Often tucks chin to facilitate passage of food. She will not eat meat, living off soups, yogurt and similar foods. Reports slow weight loss of 10 pounds over last 3 years. No odynophagia. Mouth not particularly dry. No reflux  symptoms. Neck CTscan July 2013 non-diagnostic.        Assessment / Plan / Recommendation Clinical Impression  Dysphagia Diagnosis: Within Functional Limits Clinical impression: [name] took 1/4 tsp amounts of puree which were difficult to view on this exam. She immediately requested thin liquid sips following this. Small thin liquid sips and small amount of puree revealed no significant residue, penetration or aspiration. Successive cup sips of thin liquids were WFL, again, without significant residue, laryngeal penetration or aspiration. [name] refused larger amounts of puree, graham cracker or straw sips of thin liquids despite maximum encouragement. I explained to her that in order to fully assess her swallowing problem she would need to take solids and larger amounts. She adamanltly refused further PO trials. During this limited assessment, she did exhibit mildly reduced laryngeal elevation and pharyngeal contraction which did not afffect her swallowing function. Consider neurology vs psychology w/u for dysphagia. Recommend regular solids, thin liquids as tolerated by pt. No f/u with ST at this time.    Treatment Recommendation  No treatment recommended at this time    Diet Recommendation Regular;Thin liquid   Liquid Administration via: Cup Medication Administration: Crushed with puree Supervision: Patient able to self feed Compensations: Small sips/bites Postural Changes and/or Swallow Maneuvers: Seated upright 90 degrees    Other  Recommendations Recommended Consults:  (consider neuro vs physchological w/u for swallowing ) Oral Care Recommendations: Oral care BID   Follow Up Recommendations  None    Frequency and Duration        Pertinent Vitals/Pain Pt  reported pain in her left neck/jaw, that she did not rate. She reported it started this am. Pain has been constant. Pt instructed to call her doctor is this did not improve.    SLP Swallow Goals     General Date  of Onset: 02/26/09 HPI: Patient is a 68 year old female evaluated in 2009 and 2012 by Dr. Henrene Pastor for chronic bloating. Previous evaluations have been negative including abdominal ultrasound in December 2010, gastric emptying scan February 2012, upper endoscopy March 2012, and CT scan of the abdomen and pelvis 2012. Colonoscopy in April of 2008 was normal limited by poor preparation but no gross abnormalities seen.  Type of Study: Modified Barium Swallowing Study Reason for Referral: Objectively evaluate swallowing function Previous Swallow Assessment: none Diet Prior to this Study: Thin liquids;Dysphagia 1 (puree) (pt placed herself on puree, liquid only diet) Temperature Spikes Noted: No Respiratory Status: Room air History of Recent Intubation: No Behavior/Cognition: Cooperative (anxious, apprehensive about MBSS) Oral Cavity - Dentition: Adequate natural dentition Oral Motor / Sensory Function: Within functional limits Self-Feeding Abilities: Able to feed self Patient Positioning: Upright in chair Baseline Vocal Quality: Clear Volitional Cough: Strong Volitional Swallow: Able to elicit Anatomy: Within functional limits Pharyngeal Secretions: Not observed secondary MBS    Reason for Referral Objectively evaluate swallowing function   Oral Phase Oral Preparation/Oral Phase Oral Phase: WFL   Pharyngeal Phase Pharyngeal Phase Pharyngeal Phase: Within functional limits  Cervical Esophageal Phase    GO    Cervical Esophageal Phase Cervical Esophageal Phase: [facility]    Functional Assessment Tool Used: NOMS Functional Limitations: Swallowing Swallow Current Status (B3435): At least 20 percent but less than 40 percent impaired, limited or restricted Swallow Goal Status [phone]): At least 20 percent but less than 40 percent impaired, limited or restricted Swallow Discharge Status [phone]): At least 20 percent but less than 40 percent impaired, limited or restricted    Cliffton Asters 02/26/2014, 2:06 PM

## 2014-03-29 ENCOUNTER — Encounter: Payer: Self-pay | Admitting: Neurology

## 2014-03-29 ENCOUNTER — Ambulatory Visit (INDEPENDENT_AMBULATORY_CARE_PROVIDER_SITE_OTHER): Payer: Medicare Other | Admitting: Neurology

## 2014-03-29 VITALS — BP 116/68 | HR 113 | Temp 98.2°F

## 2014-03-29 DIAGNOSIS — M542 Cervicalgia: Secondary | ICD-10-CM

## 2014-03-29 DIAGNOSIS — R1310 Dysphagia, unspecified: Secondary | ICD-10-CM | POA: Diagnosis not present

## 2014-03-29 NOTE — Patient Instructions (Addendum)
1. MRI brain with and without contrast 2. MRI cervical spine without contrast 3. EMG/NCV of face for dysphagia 4. Follow-up after the tests

## 2014-03-29 NOTE — Progress Notes (Signed)
NEUROLOGY CONSULTATION NOTE  Kristin [name] MRN: [id number] DOB: 24-Jun-1946  Referring provider: Dr. York Ram Primary care provider: Dr. York Ram  Reason for consult:  Difficulty swallowing  Dear Dr Jimmye Norman:  Thank you for your kind referral of Kristin [name] for consultation of the above symptoms. Although her  history is well known to you, please allow me to reiterate it for the purpose of our medical record. Records and images were personally reviewed where available.  HISTORY OF PRESENT ILLNESS: This is a 68 year old right-handed woman with a history of hypertension, hyperlipidemia, hypothyroidism, presenting with a 4-year history of gradually progressive difficulty swallowing where she has now lost 10 pounds in a 2 year period due to inability to swallow food.  At times, she cannot swallow her saliva.  Symptoms have slightly improved that she could eat yoghurt, ice cream, and some rice this week, but her stomach has been affected because of inability to eat.  She denies any clear pain with swallowing, reporting that the sensation is "like my throat is closing up on the left side."  She puts a piece of cotton in her left ear, because air coming in the ear seems to cause some pain in her throat. In the winter, she has pain on the left side of her neck, "like someone is jabbing a sharp object inside my throat." Other times it feels that "it is just closed up."  She denies any headaches, dizziness, diplopia, proximal weakness, focal numbness/tingling, bowel/bladder dysfunction.  She has neck pain from arthritis and goes to physical therapy.  She denies any family history of similar symptoms.  She has been evaluated by ENT and GI, per patient she has been unable to do barium swallow due to inability to swallow.  She has had a CT soft tissue neck which was unremarkable, there is near total thyroidectomy with some residual gland on the right and posteriorly, no significant  cervical adenopathy, advanced cervical spondylosis.    PAST MEDICAL HISTORY: Past Medical History  Diagnosis Date  . Vitiligo   . Degeneration of cervical intervertebral disc   . Cervicalgia   . Unspecified vitamin D deficiency   . Esophageal reflux   . Personal history of peptic ulcer disease   . Acute sinusitis, unspecified   . Chest pain, unspecified   . COPD (chronic obstructive pulmonary disease)   . Obstructive chronic bronchitis with exacerbation   . Unspecified hypothyroidism   . Unspecified essential hypertension   . Hyperlipidemia   . Diverticulosis of colon (without mention of hemorrhage)     PAST SURGICAL HISTORY: Past Surgical History  Procedure Laterality Date  . Abdominal hysterectomy w/ partial vaginactomy    . Thyroidectomy, partial    . Breast biopsy Right   . Tonsillectomy    . Colonoscopy  05/19/2012  . Esophagogastroduodenoscopy  01/03/2011    normal     MEDICATIONS: Current Outpatient Prescriptions on File Prior to Visit  Medication Sig Dispense Refill  . albuterol (PROVENTIL HFA;VENTOLIN HFA) 108 (90 BASE) MCG/ACT inhaler Inhale 2 puffs into the lungs every 6 (six) hours as needed.        Marland Kitchen aspirin 325 MG tablet Take 325 mg by mouth daily.        Marland Kitchen guaiFENesin-dextromethorphan (ROBITUSSIN DM) 100-10 MG/5ML syrup Take 5 mLs by mouth 3 (three) times daily as needed for cough.      . hydrochlorothiazide 25 MG tablet Take 25 mg by mouth daily.        Marland Kitchen  levothyroxine (SYNTHROID, LEVOTHROID) 88 MCG tablet Take 88 mcg by mouth daily.        . rosuvastatin (CRESTOR) 5 MG tablet Take 5 mg by mouth daily.      Marland Kitchen telmisartan (MICARDIS) 40 MG tablet Take 1 tablet (40 mg total) by mouth daily.  30 tablet  6   No current facility-administered medications on file prior to visit.    ALLERGIES: No Known Allergies  FAMILY HISTORY: Family History  Problem Relation Age of Onset  . Diabetes Mother     everyone on both sides  . Hypertension Mother     everyone  on both sides  . Heart attack Mother   . Colon cancer Neg Hx   . Stomach cancer Neg Hx   . Esophageal cancer Neg Hx   . Rectal cancer Neg Hx   . Heart attack Maternal Grandmother     SOCIAL HISTORY: History   Social History  . Marital Status: Widowed    Spouse Name: N/A    Number of Children: 3  . Years of Education: N/A   Occupational History  . unemployed    Social History Main Topics  . Smoking status: Current Every Day Smoker -- 0.25 packs/day for 50 years    Types: Cigarettes  . Smokeless tobacco: Never Used     Comment: form given 02-18-14  . Alcohol Use: No  . Drug Use: No  . Sexual Activity: Not on file   Other Topics Concern  . Not on file   Social History Narrative  . No narrative on file    REVIEW OF SYSTEMS: Constitutional: No fevers, chills, or sweats, no generalized fatigue, +change in appetite Eyes: No visual changes, double vision, eye pain Ear, nose and throat: No hearing loss, ear pain, nasal congestion, sore throat Cardiovascular: No chest pain, palpitations Respiratory:  No shortness of breath at rest or with exertion, wheezes GastrointestinaI: No nausea, vomiting, diarrhea, abdominal pain, fecal incontinence Genitourinary:  No dysuria, urinary retention or frequency Musculoskeletal:  + neck pain, back pain Integumentary: No rash, pruritus, skin lesions Neurological: as above Psychiatric: No depression, insomnia, anxiety Endocrine: No palpitations, fatigue, diaphoresis, mood swings, change in appetite, change in weight, increased thirst Hematologic/Lymphatic:  No anemia, purpura, petechiae. Allergic/Immunologic: no itchy/runny eyes, nasal congestion, recent allergic reactions, rashes  PHYSICAL EXAM: Filed Vitals:   03/29/14 1439  BP: 116/68  Pulse: 113  Temp: 98.2 F (36.8 C)   General: No acute distress Head:  Normocephalic/atraumatic. There is a piece of cotton inside her left ear canal Eyes: Fundoscopic exam shows bilateral sharp  discs, no vessel changes, exudates, or hemorrhages Neck: supple, no paraspinal tenderness, full range of motion Back: No paraspinal tenderness Heart: regular rate and rhythm Lungs: Clear to auscultation bilaterally. Vascular: No carotid bruits. Skin/Extremities: No rash, no edema Neurological Exam: Mental status: alert and oriented to person, place, and time, no dysarthria or aphasia, Fund of knowledge is appropriate.  Recent and remote memory are intact.  Attention and concentration are normal.    Able to name objects and repeat phrases. Cranial nerves: CN I: not tested CN II: pupils equal, round and reactive to light, visual fields intact, fundi unremarkable. CN III, IV, VI:  full range of motion, no nystagmus, no ptosis CN V: facial sensation intact CN VII: upper and lower face symmetric CN VIII: hearing intact to finger rub CN IX, X: gag intact, uvula midline. No tongue fasciculations CN XI: sternocleidomastoid and trapezius muscles intact CN XII: tongue midline Bulk &  Tone: normal, no fasciculations. Motor: 5/5 throughout with no pronator drift. Sensation: intact to light touch, cold, pin, vibration and joint position sense.  No extinction to double simultaneous stimulation.  Romberg test negative Deep Tendon Reflexes: +2 throughout, no ankle clonus Plantar responses: downgoing bilaterally Cerebellar: no incoordination on finger to nose, heel to shin. No dysdiadochokinesia Gait: narrow-based and steady, able to tandem walk adequately. Tremor: none  IMPRESSION: This is a 68 year old right-handed woman with a history of hypertension, hyperlipidemia, hypothyroidism, cervical degenerative disease, presenting with a 4-year history of dysphagia of unclear etiology.  She has been evaluated by GI and ENT in the past with unremarkable workup, here for possible neurological cause of her symptoms.  Considerations include myopathy/myositis, connective tissue disorder (scleroderma, mixed  connective tissue disease), less likely due to intracranial process, however MRI brain with and without contrast will be ordered to rule out structural abnormality.  Bloodwork for ESR, CRP, ANA, anti-scleroderma antibody, SS-A, SS-B, CK (creatine kinase), RF will be ordered.  She will have an EMG of the bulbar muscles for further evaluation.  She also reports significant neck pain and sensation of obstruction in the neck region, MRI cervical spine will be ordered. She will follow-up after the tests.  Thank you for allowing me to participate in the care of this patient. Please do not hesitate to call for any questions or concerns.   Ellouise Newer, M.D.

## 2014-03-31 ENCOUNTER — Encounter: Payer: Self-pay | Admitting: Neurology

## 2014-04-01 ENCOUNTER — Other Ambulatory Visit: Payer: Self-pay | Admitting: *Deleted

## 2014-04-01 DIAGNOSIS — M542 Cervicalgia: Secondary | ICD-10-CM

## 2014-04-02 LAB — SEDIMENTATION RATE: SED RATE: 7 mm/h (ref 0–22)

## 2014-04-02 LAB — CK: Total CK: 240 U/L — ABNORMAL HIGH (ref 7–177)

## 2014-04-02 LAB — RHEUMATOID FACTOR

## 2014-04-02 LAB — C-REACTIVE PROTEIN: CRP: <0.5 mg/dL (ref <0.60)

## 2014-04-05 LAB — ANTI-SCLERODERMA ANTIBODY: Scleroderma (Scl-70) (ENA) Antibody, IgG: <1

## 2014-04-05 LAB — SJOGREN'S SYNDROME ANTIBODS(SSA + SSB)
SSA (RO) (ENA) ANTIBODY, IGG: NEGATIVE
SSB (LA) (ENA) ANTIBODY, IGG: NEGATIVE

## 2014-04-05 LAB — ANA: Anti Nuclear Antibody(ANA): POSITIVE — AB

## 2014-04-05 LAB — ANTI-NUCLEAR AB-TITER (ANA TITER)

## 2014-04-12 ENCOUNTER — Ambulatory Visit (INDEPENDENT_AMBULATORY_CARE_PROVIDER_SITE_OTHER): Payer: Medicare Other | Admitting: Neurology

## 2014-04-12 DIAGNOSIS — M542 Cervicalgia: Secondary | ICD-10-CM | POA: Diagnosis not present

## 2014-04-12 DIAGNOSIS — R1310 Dysphagia, unspecified: Secondary | ICD-10-CM

## 2014-04-12 NOTE — Procedures (Signed)
[facility]  [address], [place]  [place], [place] [address] Tel: [phone] Fax:  [phone] Test Date:  04/12/2014  Patient: Kristin [name] DOB: 28-Jul-1946 Physician: Narda Amber, DO  Sex: Female Height: 5\' 6"  Ref Phys: Ellouise Newer  ID#: [id number] Temp: 32.6C Technician: Laureen Ochs R. NCS T.   Patient Complaints: Patient is a 68 year old female here for evaluation of L>R sided weakness and difficulty swallowing.  NCV & EMG Findings: Extensive electrodiagnostic testing of the left upper extremity, left lower extremity, midthoracic paraspinal muscles (T7 and T11 levels), and bulbar muscles shows:  1. Normal median, ulnar, radial, superficial peroneal, and sural sensory responses. 2. Normal median, ulnar, peroneal, and tibial motor responses. 3. In the arms, chronic motor axon loss changes are seen affecting the first dorsal interosseous and pronator teres muscle with active changes present in the pronator teres and triceps muscles. 4. In the legs, chronic motor axon loss changes are seen affecting L2-S1 myotomes with active denervation isolated to the anterior tibialis and flexor digitorum longus muscles. 5. Active motor axon loss is present in the thoracic paraspinal muscles at T7 and T11. 6. There is evidence of active and chronic motor axon loss changes affecting the hyoglossus muscle.  7. Fasciculation potentials are rare and seen in 2 of 18 tested muscles.    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    ___________________________ Narda Amber, DO    Nerve Conduction Studies Anti Sensory Summary Table   Site NR Peak (ms) Norm Peak (ms) P-T Amp (V) Norm P-T Amp  Left Median Anti Sensory  (2nd Digit)  Wrist    3.2 <3.8 38.9 >10  Left Radial Anti Sensory (Base 1st Digit)  Wrist    2.5 <2.8 37.1 >10  Left Sup Peroneal Anti Sensory (Ant Lat Mall)  Site 2    2.9  16.2   Left Sural Anti Sensory (Lat Mall)  Calf    3.8 <4.6 15.3 >3  Left Ulnar Anti Sensory (5th Digit)  Wrist    3.1 <3.2 31.9 >5   Motor Summary Table   Site NR Onset (ms) Norm Onset (ms) O-P Amp (mV) Norm O-P Amp Site1 Site2 Delta-0 (ms) Dist (cm) Vel (m/s) Norm Vel (m/s)  Left Median Motor (Abd Poll Brev)  Wrist    3.0 <4.0 11.0 >5 Elbow Wrist 5.0 25.0 50 >50  Elbow    8.0  11.0         Left Peroneal Motor (Ext Dig Brev)  Ankle    3.6 <6.0 2.9 >2.5 B Fib Ankle 6.7 31.0 46 >40  B Fib    10.3  2.5  Poplt B Fib 2.4 10.0 42 >40  Poplt    12.7  2.5         Left Peroneal TA Motor (Tib Ant)  Fib Head    2.1 <4.5 3.9 >3 Poplit Fib Head 2.0 10.0 50 >40  Poplit    4.1  3.7         Left Tibial Motor (Abd Hall Brev)  Ankle    3.4 <6.0 13.2 >4 Knee Ankle 9.1 38.5 42 >40  Knee    12.5  8.5         Left Ulnar Motor (Abd Dig Minimi)  Wrist    2.6 <3.1 8.9 >7 B Elbow Wrist 4.4 22.0 50 >50  B Elbow    7.0  7.9  A Elbow B Elbow 1.9 10.0 53 >50  A Elbow    8.9  7.5          EMG   Side Muscle Ins Act Fibs Psw Fasc Number Recrt Dur Dur. Amp Amp. Poly Poly. Comment  Left AntTibialis Nml 1+ 1+ 1+ 1- Mod-R Few 1+ Few 1+ Nml Nml MMAV  Left Gastroc Nml Nml Nml Nml 1- Mod-R Few 1+ Few 1+ Nml Nml N/A  Left Flex Dig Long Nml Nml 1+ Nml 1- Mod-V Few 1+ Few 1+ Nml Nml N/A  Left VastusLat Nml Nml Nml Nml 1- Mod-R Some 1+ Nml Nml Nml Nml N/A  Left AdductorLong Nml Nml Nml Nml 1- Mod-V Few 1+ Few 1+ Nml Nml N/A  Left GluteusMed Nml Nml Nml Nml Nml Nml Nml Nml Nml Nml Nml Nml N/A  Left Lumbo Parasp Low Nml 1+ Nml Nml NE - - - - - - - N/A  Left T11 Parasp Nml 1+ Nml Nml NE - - - - - - - N/A  Left T7 Parasp CRD 1+ Nml Nml NE - - - - - - - N/A  Left Cervical Parasp Low Nml Nml Nml Nml NE - - - - - - - N/A  Left 1stDorInt Nml Nml Nml  Nml 1- Mod-R Few 1+ Nml Nml Nml Nml N/A  Left FlexPolLong Nml Nml Nml Nml Nml Nml Nml Nml Nml Nml Nml Nml N/A  Left PronatorTeres Nml 1+ Nml Nml 1- Mod-R Few 1+ Nml Nml Nml Nml N/A  Left Biceps Nml Nml Nml Nml Nml Nml Nml Nml Nml Nml Nml Nml N/A  Left Triceps Nml 1+ Nml Nml Nml Nml Nml Nml Nml Nml Nml Nml N/A  Left Deltoid Nml Nml Nml Nml Nml Nml Nml Nml Nml Nml Nml Nml N/A  Left Mentalis Nml Nml Nml Nml Nml Nml Nml Nml Nml Nml Nml Nml N/A  Left Hyoglossus Nml 1+ Nml Nml 1- Mod-R Some 1+ Few 1+ Few 1+ N/A      Waveforms:

## 2014-04-13 ENCOUNTER — Ambulatory Visit (HOSPITAL_COMMUNITY)
Admission: RE | Admit: 2014-04-13 | Discharge: 2014-04-13 | Disposition: A | Discharge status: Home / Self Care | Payer: Medicare Other | Source: Ambulatory Visit | Attending: Neurology | Admitting: Neurology

## 2014-04-13 ENCOUNTER — Telehealth: Payer: Self-pay | Admitting: Neurology

## 2014-04-13 ENCOUNTER — Encounter (INDEPENDENT_AMBULATORY_CARE_PROVIDER_SITE_OTHER): Payer: Self-pay

## 2014-04-13 ENCOUNTER — Ambulatory Visit (HOSPITAL_COMMUNITY): Payer: Medicare Other

## 2014-04-13 DIAGNOSIS — M5030 Other cervical disc degeneration, unspecified cervical region: Secondary | ICD-10-CM | POA: Diagnosis not present

## 2014-04-13 DIAGNOSIS — M47812 Spondylosis without myelopathy or radiculopathy, cervical region: Secondary | ICD-10-CM | POA: Insufficient documentation

## 2014-04-13 DIAGNOSIS — M5020 Other cervical disc displacement, unspecified cervical region: Secondary | ICD-10-CM | POA: Insufficient documentation

## 2014-04-13 DIAGNOSIS — R1310 Dysphagia, unspecified: Secondary | ICD-10-CM | POA: Insufficient documentation

## 2014-04-13 DIAGNOSIS — M4802 Spinal stenosis, cervical region: Secondary | ICD-10-CM | POA: Diagnosis not present

## 2014-04-13 DIAGNOSIS — I6789 Other cerebrovascular disease: Secondary | ICD-10-CM | POA: Diagnosis not present

## 2014-04-13 DIAGNOSIS — M542 Cervicalgia: Secondary | ICD-10-CM | POA: Insufficient documentation

## 2014-04-13 DIAGNOSIS — R930 Abnormal findings on diagnostic imaging of skull and head, not elsewhere classified: Secondary | ICD-10-CM | POA: Diagnosis not present

## 2014-04-13 LAB — CREATININE, SERUM
Creatinine, Ser: 0.71 mg/dL (ref 0.50–1.10)
GFR calc Af Amer: >90 mL/min (ref >90)
GFR calc non Af Amer: 88 mL/min — ABNORMAL LOW (ref >90)

## 2014-04-13 MED ORDER — GADOBENATE DIMEGLUMINE 529 MG/ML IV SOLN
10.0000 mL | Freq: Once | INTRAVENOUS | Status: AC
  Administered 2014-04-13: 10 mL via INTRAVENOUS

## 2014-04-13 NOTE — Telephone Encounter (Signed)
I spoke to patient, discussed MRI brain and cervical spine, unremarkable brain. Cervical spine shows some stenosis but would not be the cause of her symptoms.  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 Posey Pronto for further follow-up. Patient expressed understanding.

## 2014-04-20 ENCOUNTER — Ambulatory Visit (INDEPENDENT_AMBULATORY_CARE_PROVIDER_SITE_OTHER): Payer: Medicare Other | Admitting: Neurology

## 2014-04-20 ENCOUNTER — Encounter: Payer: Self-pay | Admitting: Neurology

## 2014-04-20 VITALS — BP 120/64 | HR 108 | Ht 66.54 in | Wt 113.0 lb

## 2014-04-20 DIAGNOSIS — G1221 Amyotrophic lateral sclerosis: Secondary | ICD-10-CM

## 2014-04-20 DIAGNOSIS — G1220 Motor neuron disease, unspecified: Secondary | ICD-10-CM

## 2014-04-20 NOTE — Progress Notes (Signed)
Note faxed to Dr. York Ram.  [phone].

## 2014-04-20 NOTE — Patient Instructions (Signed)
1.  Check blood work 2.  Return to clinic in 4 weeks

## 2014-04-20 NOTE — Progress Notes (Signed)
[facility] Clinic Note - Initial Visit   Date: 04/20/2014  Kristin [name] MRN: [id number] DOB: 12/07/1945   Dear Dr. Karel Jarvis:  Thank you for your kind referral of Kristin [name] for consultation of dysphagia. Although her history is well known to you, please allow [place] to reiterate it for the purpose of our medical record. The patient was accompanied to the clinic by self.   History of Present Illness: Kristin [name] is a 68 y.o. right-handed African American female with history of hypertension, hyperlipidemia, hypothyroidism, GERD, COPD, current tobacco use, and borderline diabetes (not on medications) presenting for evaluation of dysphagia.    Starting around 2011, she developed gradual and progressive difficulty swallowing where she has now lost 10 pounds in a 2 year period due to inability to swallow food.  She is currently on a soft food diet and eats yogurt, ice cream, and soups.  She tries to drink ensure, but developed abdominal pain.  She is not having any significant problems swallowing liquids.  She feels as if her symptoms are episodic at times and has problems even swallowing her saliva.    She has mild weakness of the left arm because she is unable to lift heavy objects as well as previously.  She denies any slurring of speech, muscle twitches, or cramps. She endorses shortness of breath with exertion.  She sleeps on 3-pillows since 2013.  She has previously been evaluated by ENT and GI with negative workup.  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 Modified barium swallow was ordered, however patient was unable to complete the study due to intolerance of testing and inability to swallow.  She was most recently seen by my colleague, Dr. Karel Jarvis, who ordered electrodiagnostic  testing and labs. 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 She has been referred to me for ongoing management.     Past Medical History  Diagnosis Date  . Vitiligo   . Degeneration of cervical intervertebral disc   . Cervicalgia   . Unspecified vitamin D deficiency   . Esophageal reflux   . Personal history of peptic ulcer disease   . Acute sinusitis, unspecified   . Chest pain, unspecified   . COPD (chronic obstructive pulmonary disease)   . Obstructive chronic bronchitis with exacerbation   . Unspecified hypothyroidism   . Unspecified essential hypertension   . Hyperlipidemia   . Diverticulosis of colon (without mention of hemorrhage)     Past Surgical History  Procedure Laterality Date  . Abdominal hysterectomy w/ partial vaginactomy    . Thyroidectomy, partial    . Breast biopsy Right   . Tonsillectomy    . Colonoscopy  05/19/2012  . Esophagogastroduodenoscopy  01/03/2011    normal      Medications:  Current Outpatient Prescriptions on File Prior to Visit  Medication Sig Dispense Refill  . albuterol (PROVENTIL HFA;VENTOLIN HFA) 108 (90 BASE) MCG/ACT inhaler Inhale 2 puffs into the lungs every 6 (six) hours as needed.        Marland Kitchen aspirin 325 MG tablet Take 325 mg by mouth daily.        Marland Kitchen guaiFENesin-dextromethorphan (ROBITUSSIN DM) 100-10 MG/5ML syrup Take 5 mLs by mouth 3 (three) times daily as needed for cough.      Marland Kitchen  hydrochlorothiazide 25 MG tablet Take 25 mg by mouth daily.        Marland Kitchen levothyroxine (SYNTHROID, LEVOTHROID) 88 MCG tablet Take 88 mcg by mouth daily.        . rosuvastatin (CRESTOR) 5 MG tablet Take 5 mg by mouth daily.      Marland Kitchen telmisartan (MICARDIS) 40 MG tablet Take 1 tablet (40 mg total) by mouth daily.  30 tablet  6   No current facility-administered medications on file prior to visit.    Allergies: No Known Allergies  Family  History: Family History  Problem Relation Age of Onset  . Diabetes Mother     everyone on both sides  . Hypertension Mother     everyone on both sides  . Heart attack Mother   . Colon cancer Neg Hx   . Stomach cancer Neg Hx   . Esophageal cancer Neg Hx   . Rectal cancer Neg Hx   . Heart attack Maternal Grandmother     Social History: History   Social History  . Marital Status: Divorced    Spouse Name: N/A    Number of Children: 3  . Years of Education: N/A   Occupational History  . unemployed    Social History Main Topics  . Smoking status: Current Every Day Smoker -- 0.50 packs/day for 50 years    Types: Cigarettes  . Smokeless tobacco: Never Used     Comment: form given 02-18-14  . Alcohol Use: No  . Drug Use: No  . Sexual Activity: Not on file   Other Topics Concern  . Not on file   Social History Narrative   She lives alone.  She moved to [place] in 1998.  She has three grown children (2 sons, 1 daughter).  One son lives in [place].   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   EYES: No visual changes or eye pain ENT: No hearing changes.  No history of nose bleeds.   RESPIRATORY: No cough, wheezing and shortness of breath.   CARDIOVASCULAR: Negative for chest pain, and palpitations.   GI: Negative for abdominal discomfort, blood in stools or black stools.  No recent change in bowel habits.   GU:  No history of incontinence.   MUSCLOSKELETAL: No history of joint pain or swelling.  No myalgias.   SKIN: Negative for lesions, rash, and itching.   HEMATOLOGY/ONCOLOGY: Negative for prolonged bleeding, bruising easily, and swollen nodes.  No history of cancer.   ENDOCRINE: Negative for cold or heat intolerance, polydipsia or goiter.   PSYCH:  No depression or anxiety symptoms.   NEURO: As  Above.   Vital Signs:  BP 120/64  Pulse 108  Ht 5' 6.53" (1.69 m)  Wt 113 lb (51.256 kg)  BMI 17.95 kg/m2  SpO2 91%   General Medical Exam:   General:  Thin-appearing, comfortable.   Eyes/ENT: see cranial nerve examination.   Neck: No masses appreciated.  Full range of motion without tenderness.  No carotid bruits. Respiratory:  Clear to auscultation, good air entry bilaterally.   Cardiac:  Regular rate and rhythm, no murmur.   Back:  No pain to palpation of spinous processes.   Extremities:  No deformities, edema, or skin discoloration. Good capillary refill.   Skin:  Skin color, texture, turgor normal. No rashes  or lesions.  Neurological Exam: MENTAL STATUS including orientation to time, place, person, recent and remote memory, attention span and concentration, language, and fund of knowledge is normal.  Speech is not dysarthric.  She is able to enunciate lingual and guttural sounds without difficulty.  CRANIAL NERVES: II:  No visual field defects.  Unremarkable fundi.   III-IV-VI: Pupils equal round and reactive to light.  Normal conjugate, extra-ocular eye movements in all directions of gaze.  No nystagmus.  No ptosis.   V:  Normal facial sensation.  Jaw jerk is absent.   VII:  Normal facial symmetry and movements.  Myerson's sign and snout is present (L>R).  VIII:  Normal hearing and vestibular function.   IX-X:  Normal palatal movement.   XI:  Normal shoulder shrug and head rotation.   XII:  Normal tongue strength and range of motion, no deviation or fasciculation.  MOTOR:  Muscle bulk is reduced throughout. Rare fasciculations were noticed over the right forearm. No pronator drift.  Tone is normal.    Right Upper Extremity:    Left Upper Extremity:    Deltoid  5/5   Deltoid  5/5   Biceps  5/5   Biceps  5/5   Triceps  5-/5   Triceps  5-/5   Medial pectoralis  4+/5  Medial pectoralis  4+/5  Infraspinatus  4/5  Infraspinatus  4/5  Wrist extensors  5/5   Wrist extensors   5/5   Wrist flexors  5/5   Wrist flexors  5/5   Finger extensors  5/5   Finger extensors  5/5   Finger flexors  5/5   Finger flexors  5/5   Dorsal interossei  5/5   Dorsal interossei  5/5   Abductor pollicis  5/5   Abductor pollicis  5/5   Tone (Ashworth scale)  0  Tone (Ashworth scale)  0   Right Lower Extremity:    Left Lower Extremity:    Hip flexors  5/5   Hip flexors  5/5   Hip extensors  5/5   Hip extensors  5/5   Knee flexors  5/5   Knee flexors  5/5   Knee extensors  5/5   Knee extensors  5/5   Dorsiflexors  5/5   Dorsiflexors  5/5   Plantarflexors  5/5   Plantarflexors  5/5   Toe extensors  5/5   Toe extensors  5/5   Toe flexors  5/5   Toe flexors  5/5   Tone (Ashworth scale)  0  Tone (Ashworth scale)  0   MSRs:  Right                                                                 Left brachioradialis 3+  brachioradialis 3+  biceps 3+  biceps 3+  triceps 3+  triceps 2+  patellar 2+  patellar 3+  ankle jerk 1+  ankle jerk 1+  Hoffman no  Hoffman no  plantar response down  plantar response down   SENSORY:  Normal and symmetric perception of light touch, pinprick, vibration, and proprioception.  Romberg's sign absent.   COORDINATION/GAIT: Normal finger-to- nose-finger and heel-to-shin.  Intact rapid alternating movements bilaterally.  Able to rise from a chair without using arms.  Gait  narrow based and stable. Tandem and stressed gait intact.   Data: MRI brain and cervical spine 04/13/2014:  Mild chronic microvascular ischemic change in the white matter. No acute intracranial abnormality.  Moderate to advanced cervical spondylosis. Central disc protrusions at C3-4 and C4-5 and C5-6 causing spinal stenosis and foraminal encroachment. This is most severe at C3-4 and C4-5.  EMG 04/12/2014: Extensive electrodiagnostic testing of the left upper extremity, left lower extremity, midthoracic paraspinal muscles (T7 and T11 levels), and bulbar muscles shows:  1. Normal median,  ulnar, radial, superficial peroneal, and sural sensory responses. 2. Normal median, ulnar, peroneal, and tibial motor responses. 3. In the arms, chronic motor axon loss changes are seen affecting the first dorsal interosseous and pronator teres muscle with active changes present in the pronator teres and triceps muscles. 4. In the legs, chronic motor axon loss changes are seen affecting L2-S1 myotomes with active denervation isolated to the anterior tibialis and flexor digitorum longus muscles. 5. Active motor axon loss is present in the thoracic paraspinal muscles at T7 and T11. 6. There is evidence of active and chronic motor axon loss changes affecting the hyoglossus muscle.  7. Fasciculation potentials are rare and seen in 2 of 18 tested muscles.  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    Labs 04/02/19/15: CK 240*, ANA positive (1:40), CRP <0.5, ESR 7, SCL 70 - negative, SSA/B negative, RF negative   IMPRESSION: Mrs. Wormley is a delightful 68 year old female presenting for evaluation of progressively worsening dysphagia since 2011. Her neurological exam shows pathological facial reflexes and generalized hyperreflexia, worse on the left side, and mild proximal arm weakness.  Previous workup has been reviewed.  MRI brain shows no abnormalities to explain her bulbar findings. MRI of the cervical spine shows central disc protrusions at C3 - C6 causing spinal stenosis and foraminal encroachment, which may be contributing to her hyperreflexia, however would not explain bulbar findings. CKs have always been borderline elevated. 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 I have discussed that there is evidence of injury to the motor neurons and I would like to check for extrinsic causes of injury such as inflammatory, nutritional deficiency, and paraproteinemias. If, however, there is little compelling evidence from the remainder of the work-up to suggest external injury to motor neurons, the diagnosis of amyotrophic lateral sclerosis may be likely. I had extensive discussion regarding the pathophysiology, etiology, and management of motor neuron disease with patient.   PLAN/RECOMMENDATIONS:  1. Check GM1, UPEP/SPEP with IFE, vitamin B12, copper, zinc 2. Return to clinic in 36-month.   The duration of this appointment visit was 65 minutes of face-to-face time with the patient.  Greater than 50% of this time was spent in counseling, explanation of diagnosis, planning of further management, and coordination of care.   Thank you for allowing me to participate in patient's care.  If I can answer any additional questions, I would be pleased to do so.    Sincerely,    Donika K. PPosey Pronto DO

## 2014-04-23 LAB — SPEP & IFE WITH QIG
ALPHA-2-GLOBULIN: 9.8 % (ref 7.1–11.8)
Albumin ELP: 57.7 % (ref 55.8–66.1)
Alpha-1-Globulin: 6.5 % — ABNORMAL HIGH (ref 2.9–4.9)
BETA GLOBULIN: 5.1 % (ref 4.7–7.2)
Beta 2: 3.6 % (ref 3.2–6.5)
Gamma Globulin: 17.3 % (ref 11.1–18.8)
IgA: 131 mg/dL (ref 69–380)
IgG (Immunoglobin G), Serum: 1320 mg/dL (ref 690–1700)
IgM, Serum: 98 mg/dL (ref 52–322)
Total Protein, Serum Electrophoresis: 7.2 g/dL (ref 6.0–8.3)

## 2014-04-26 DIAGNOSIS — E538 Deficiency of other specified B group vitamins: Secondary | ICD-10-CM | POA: Diagnosis not present

## 2014-04-26 DIAGNOSIS — G1221 Amyotrophic lateral sclerosis: Secondary | ICD-10-CM | POA: Diagnosis not present

## 2014-04-29 LAB — UIFE/LIGHT CHAINS/TP QN, 24-HR UR
% BETA, URINE: 0 %
ALBUMIN, U: 100 %
ALPHA 1 URINE: 0 %
ALPHA-2-GLOBULIN, U: 0 %
GAMMA GLOBULIN URINE: 0 %
Protein, Ur: <4 mg/dL (ref 0.0–15.0)

## 2014-04-29 LAB — ZINC: Zinc: 43 ug/dL — ABNORMAL LOW (ref 56–134)

## 2014-04-29 LAB — VITAMIN B12: Vitamin B-12: 287 pg/mL (ref 211–946)

## 2014-04-29 LAB — COPPER, SERUM: Copper: 5 ug/dL — ABNORMAL LOW (ref 72–166)

## 2014-05-04 LAB — GM1 ANTIBODY IGG, IGM
GM 1 IgG: <1:100 {titer}
GM 1 IgM: <1:100 {titer}

## 2014-05-07 ENCOUNTER — Encounter: Payer: Self-pay | Admitting: *Deleted

## 2014-05-20 ENCOUNTER — Encounter: Payer: Self-pay | Admitting: Neurology

## 2014-05-20 ENCOUNTER — Ambulatory Visit (INDEPENDENT_AMBULATORY_CARE_PROVIDER_SITE_OTHER): Payer: Medicare Other | Admitting: Neurology

## 2014-05-20 VITALS — BP 104/60 | HR 84 | Temp 98.2°F | Ht 66.0 in | Wt 113.7 lb

## 2014-05-20 DIAGNOSIS — E538 Deficiency of other specified B group vitamins: Secondary | ICD-10-CM | POA: Diagnosis not present

## 2014-05-20 DIAGNOSIS — G1221 Amyotrophic lateral sclerosis: Secondary | ICD-10-CM

## 2014-05-20 DIAGNOSIS — E60 Dietary zinc deficiency: Secondary | ICD-10-CM

## 2014-05-20 DIAGNOSIS — E618 Deficiency of other specified nutrient elements: Secondary | ICD-10-CM | POA: Diagnosis not present

## 2014-05-20 DIAGNOSIS — G1220 Motor neuron disease, unspecified: Secondary | ICD-10-CM

## 2014-05-20 DIAGNOSIS — E610 Copper deficiency: Secondary | ICD-10-CM

## 2014-05-20 NOTE — Progress Notes (Signed)
Note faxed to Dr York Ram at [phone] with confirmation received.

## 2014-05-20 NOTE — Progress Notes (Signed)
  Follow-up Visit   Date: 05/20/2014    Kristin [name] MRN: [id number] DOB: 12/26/1945   Interim History: Kristin [name] is a 68 y.o. right-handed African American female with history of history of hypertension, hyperlipidemia, hypothyroidism, GERD, COPD, current tobacco use, and borderline diabetes (not on medications) returning to the clinic for follow-up of motor neuron disease.  The patient was accompanied to the clinic by self.  History of present illness: Starting around 2011, she developed gradual and progressive difficulty swallowing where she has now lost 10 pounds in a 2 year period due to inability to swallow food. She is currently on a soft food diet and eats yogurt, ice cream, and soups. She tries to drink ensure, but developed abdominal pain. She is not having any significant problems swallowing liquids. She feels as if her symptoms are episodic at times and has problems even swallowing her saliva.   She has mild weakness of the left arm because she is unable to lift heavy objects as well as previously. She denies any slurring of speech, muscle twitches, or cramps. She endorses shortness of breath with exertion. She sleeps on 3-pillows since 2013.   She has previously been evaluated by ENT and GI with negative workup. 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 Modified barium swallow was ordered, however patient was unable to complete the study due to intolerance of testing and inability to swallow.   She was seen by my colleague, Dr. Aquino, who ordered electrodiagnostic testing and labs. 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 She was referred to me for ongoing management.  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  She is here to discuss the results of her recent lab testing which showed markedly low copper and zinc levels.  She was instructed to start supplementation but has been unable to find copper tablets.  She is taking zinc 50mg but it gives her a lot of GI irritation so stopped it.  No new neurological complaints.  She feels about the same with no worsening or improvement of symptoms.  Her weight has remained stable since April.  She is eating tubs of ice cream.  No muscle cramps, pain, twitches, or weakness.  Mood is good.   Medications:  Current Outpatient Prescriptions on File Prior to Visit  Medication Sig Dispense Refill  . albuterol (PROVENTIL HFA;VENTOLIN HFA) 108 (90 BASE) MCG/ACT inhaler Inhale 2 puffs into the lungs every 6 (six) hours as needed.        . hydrochlorothiazide 25 MG tablet Take 25 mg by mouth daily.        . levothyroxine (SYNTHROID, LEVOTHROID) 88 MCG tablet Take 88 mcg by mouth daily.        . rosuvastatin (CRESTOR) 5 MG tablet Take 5 mg by mouth daily.      . telmisartan (MICARDIS) 40 MG tablet Take 1 tablet (40 mg total) by mouth daily.  30 tablet  6   No current facility-administered medications on file prior to visit.    Allergies: No Known Allergies   Review of Systems:  CONSTITUTIONAL: No fevers, chills, night sweats, or weight loss.   EYES: No visual changes or eye   pain ENT: No hearing changes.  No history of nose bleeds.   RESPIRATORY: No cough, wheezing and shortness of breath.   CARDIOVASCULAR: Negative for chest pain, and palpitations.   GI: Negative for abdominal discomfort, blood in stools or black stools.  No recent change in bowel habits.   GU:  No history of incontinence.   MUSCLOSKELETAL: No history of joint pain or swelling.  No myalgias.   SKIN: Negative for lesions, rash, and itching.   ENDOCRINE: Negative for cold or heat  intolerance, polydipsia or goiter.   PSYCH:  No depression or anxiety symptoms.   NEURO: As Above.   Vital Signs:  BP 104/60  Pulse 84  Temp(Src) 98.2 F (36.8 C)  Ht 5' 6" (1.676 m)  Wt 113 lb 11.2 oz (51.574 kg)  BMI 18.36 kg/m2  Neurological Exam: MENTAL STATUS including orientation to time, place, person, recent and remote memory, attention span and concentration, language, and fund of knowledge is normal.  Speech is not dysarthric. She is able to enunciate lingual and guttural sounds without difficulty.  CRANIAL NERVES:  Pupils equal round and reactive to light.  Normal conjugate, extra-ocular eye movements in all directions of gaze.  No ptosis. Normal facial sensation.  Face is symmetric. Palate elevates symmetrically.  Tongue is midline. Tongue strength is normal.  Myerson's sign and snout is present (L>R).   MOTOR: Muscle bulk is reduced throughout. No fasciculations on today's exam. Tone is normal.   Right Upper Extremity:    Left Upper Extremity:    Deltoid  5/5   Deltoid  5/5   Biceps  5/5   Biceps  5/5   Triceps  5/5   Triceps  5-/5   Medial pectoralis  5-/5   Medial pectoralis  4+/5   Infraspinatus  4+/5   Infraspinatus  4/5   Wrist extensors  5/5   Wrist extensors  5/5   Wrist flexors  5/5   Wrist flexors  5/5   Finger extensors  5/5   Finger extensors  5/5   Finger flexors  5/5   Finger flexors  5/5   Dorsal interossei  5/5   Dorsal interossei  5/5   Abductor pollicis  5/5   Abductor pollicis  5/5   Tone (Ashworth scale)  0   Tone (Ashworth scale)  0    Right Lower Extremity:    Left Lower Extremity:    Hip flexors  5/5   Hip flexors  5/5   Hip extensors  5/5   Hip extensors  5/5   Knee flexors  5/5   Knee flexors  5/5   Knee extensors  5/5   Knee extensors  5/5   Dorsiflexors  5/5   Dorsiflexors  5/5   Plantarflexors  5/5   Plantarflexors  5/5   Toe extensors  5/5   Toe extensors  5/5   Toe flexors  5/5   Toe flexors  5/5   Tone (Ashworth scale)  0   Tone  (Ashworth scale)  0    MSRs:  Right      Left  brachioradialis  3+   brachioradialis  3+   biceps  3+   biceps  3+   triceps  3+   triceps  2+   patellar  2+   patellar  3+   ankle jerk  1+   ankle jerk  1+   Hoffman  no   Hoffman  no   plantar response  down     plantar response  down     Data: MRI brain and cervical spine 04/13/2014: Mild chronic microvascular ischemic change in the white matter. No acute intracranial abnormality.  Moderate to advanced cervical spondylosis. Central disc protrusions at C3-4 and C4-5 and C5-6 causing spinal stenosis and foraminal encroachment. This is most severe at C3-4 and C4-5.  EMG 04/12/2014:  Extensive electrodiagnostic testing of the left upper extremity, left lower extremity, midthoracic paraspinal muscles (T7 and T11 levels), and bulbar muscles shows:  1. Normal median, ulnar, radial, superficial peroneal, and sural sensory responses. 2. Normal median, ulnar, peroneal, and tibial motor responses. 3. In the arms, chronic motor axon loss changes are seen affecting the first dorsal interosseous and pronator teres muscle with active changes present in the pronator teres and triceps muscles. 4. In the legs, chronic motor axon loss changes are seen affecting L2-S1 myotomes with active denervation isolated to the anterior tibialis and flexor digitorum longus muscles. 5. Active motor axon loss is present in the thoracic paraspinal muscles at T7 and T11. 6. There is evidence of active and chronic motor axon loss changes affecting the hyoglossus muscle.  7. Fasciculation potentials are rare and seen in 2 of 18 tested muscles.  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 Her neurological exam is stable and shows pathological facial reflexes and generalized hyperreflexia, worse on the left side, and mild proximal arm weakness.  MRI brain shows no abnormalities to explain her bulbar findings. MRI of the cervical spine shows central disc protrusions at C3 - C6 causing spinal stenosis and foraminal encroachment, which may be contributing to her hyperreflexia, however would not explain bulbar findings. CKs have always been borderline elevated. 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  Labs looking for extrinsic causes of injury to motor neuron are notable for severely low copper and zinc levels. She also has borderline low vitamin B12.  GM1 antibody, SPEP/UPEP, ANA, SSA/B, RF, and SCL antibody is negative.    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  The issue of whether zinc and copper deficiency caused her dysphagia vs. dysphagia causing reduced PO intake and malnutrition will be difficult to delineate. If symptoms improve after supplementation, this will favor nutrition deficiency causing myeloneuropathy.  If however, symptoms continue to worsen,  ALS is most likely.  Today, I also discussed option of PEG tube for feedings and to be sure she is getting nutrients she needs.  She would like to hold   off on this for now, but if her weight continues to decline, I would strongly encourage her to consider this.   PLAN/RECOMMENDATIONS:  1.  Start vitamin B12 1000mcg daily 2.  Continue zinc 50mg daily 3.  Start copper 8mg x 1 week, 6mg x 1, then 4mg x 1 week, then 2mg thereafter 4.  Call my office if you would like to see a dietician/nutritionalist 5.  Consider PEG going forward if weight loss continues  6.  Return to clinic in 3-months at which time we can recheck labs (prealbumin, copper, zinc, B12)   The duration of this appointment visit was 40 minutes of face-to-face time with the patient.  Greater than 50% of this time was spent in counseling, explanation of diagnosis, planning of further management, and coordination of care.   Thank you for allowing me to participate in patient's care.  If I can answer any additional questions, I would be pleased to do so.    Sincerely,    Donika K. Patel, DO    

## 2014-05-20 NOTE — Patient Instructions (Addendum)
1.  Start vitamin B12 1064mcg daily 2.  Continue zinc 50mg  daily 3.  Start copper 8mg  x 1 week, 6mg  x 1, then 4mg  x 1 week, then 2mg  thereafter 4.  Call my office if you would like to see a dietician/nutritionalist 5.  Consider PEG going forward if weight loss continues  6.  Return to clinic in 20-months at which time we can recheck labs

## 2014-07-02 DIAGNOSIS — Z23 Encounter for immunization: Secondary | ICD-10-CM | POA: Diagnosis not present

## 2014-07-15 DIAGNOSIS — R5381 Other malaise: Secondary | ICD-10-CM | POA: Diagnosis not present

## 2014-07-15 DIAGNOSIS — J438 Other emphysema: Secondary | ICD-10-CM | POA: Diagnosis not present

## 2014-07-15 DIAGNOSIS — E785 Hyperlipidemia, unspecified: Secondary | ICD-10-CM | POA: Diagnosis not present

## 2014-07-15 DIAGNOSIS — E039 Hypothyroidism, unspecified: Secondary | ICD-10-CM | POA: Diagnosis not present

## 2014-07-15 DIAGNOSIS — J449 Chronic obstructive pulmonary disease, unspecified: Secondary | ICD-10-CM | POA: Diagnosis not present

## 2014-07-15 DIAGNOSIS — I10 Essential (primary) hypertension: Secondary | ICD-10-CM | POA: Diagnosis not present

## 2014-07-15 DIAGNOSIS — E119 Type 2 diabetes mellitus without complications: Secondary | ICD-10-CM | POA: Diagnosis not present

## 2014-08-16 ENCOUNTER — Telehealth: Payer: Self-pay | Admitting: Neurology

## 2014-08-16 NOTE — Telephone Encounter (Signed)
Patient is going to keep the appointment.  Looking back on last note it mentions motor neuron disease.  Also mentions possibly setting patient up with dietician and possible PEG.

## 2014-08-16 NOTE — Telephone Encounter (Signed)
Pt unable to tolerate supplements. Unsure if she should keep next week's follow up appt. Please call [phone] / Sherri S.

## 2014-08-20 ENCOUNTER — Ambulatory Visit (INDEPENDENT_AMBULATORY_CARE_PROVIDER_SITE_OTHER): Payer: Medicare Other | Admitting: Neurology

## 2014-08-20 ENCOUNTER — Encounter: Payer: Self-pay | Admitting: Neurology

## 2014-08-20 ENCOUNTER — Other Ambulatory Visit: Payer: Self-pay | Admitting: *Deleted

## 2014-08-20 VITALS — BP 100/60 | HR 106 | Ht 66.0 in | Wt 114.2 lb

## 2014-08-20 DIAGNOSIS — E60 Dietary zinc deficiency: Secondary | ICD-10-CM | POA: Diagnosis not present

## 2014-08-20 DIAGNOSIS — E610 Copper deficiency: Secondary | ICD-10-CM

## 2014-08-20 DIAGNOSIS — E569 Vitamin deficiency, unspecified: Secondary | ICD-10-CM

## 2014-08-20 DIAGNOSIS — E618 Deficiency of other specified nutrient elements: Secondary | ICD-10-CM

## 2014-08-20 DIAGNOSIS — R1310 Dysphagia, unspecified: Secondary | ICD-10-CM

## 2014-08-20 DIAGNOSIS — G1220 Motor neuron disease, unspecified: Secondary | ICD-10-CM | POA: Diagnosis not present

## 2014-08-20 DIAGNOSIS — E538 Deficiency of other specified B group vitamins: Secondary | ICD-10-CM

## 2014-08-20 NOTE — Patient Instructions (Signed)
We will contact you with appointment for nutrition referral Return to clinic in 3-6 months

## 2014-08-20 NOTE — Progress Notes (Signed)
Follow-up Visit   Date: 08/20/2014    Kristin [name] MRN: [id number] DOB: [birth year]   Interim History: Kristin [name] is a 68 y.o. 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    History of present illness: Starting around 2011, she developed gradual and progressive difficulty swallowing where she has now lost 10 pounds in a 2 year period due to inability to swallow food. She is currently on a soft food diet and eats yogurt, ice cream, and soups. She tries to drink ensure, but developed abdominal pain. She is not having any significant problems swallowing liquids. She feels as if her symptoms are episodic at times and has problems even swallowing her saliva.   She has mild weakness of the left arm because she is unable to lift heavy objects as well as previously. She denies any slurring of speech, muscle twitches, or cramps. She endorses shortness of breath with exertion. She sleeps on 3-pillows since 2013.   She has previously been evaluated by ENT and GI with negative workup. 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 Modified barium swallow was ordered, however patient was unable to complete the study due to intolerance of testing and inability to swallow.   She was seen by my colleague, Dr. Delice Lesch, who ordered electrodiagnostic testing and labs. 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 She was referred to me for ongoing management.  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  She is here to discuss the results of her recent lab testing which showed markedly low copper and zinc levels.  She was instructed to start supplementation but has been unable to find copper tablets.  She is taking zinc $RemoveBef'50mg'XzcnzynYZb$  but it gives her a lot of GI irritation so stopped it.   UPDATE 08/20/2014:  At her last visit, I recommended that she go to [facility] for start zinc and copper supplementation, but she has been unable to tolerate this due to GI upset.    She feels that her dysphagia has been stable, she has some good days and bad days.  Her weight has remained stable since April.  She is still eating tubs of ice cream.  No interval falls, hospitalizations, or new weakness. No new neurological complaints.   Medications:  Current Outpatient Prescriptions on File Prior to Visit  Medication Sig Dispense Refill  . hydrochlorothiazide 25 MG tablet Take 25 mg by mouth daily.        Marland Kitchen levothyroxine (SYNTHROID, LEVOTHROID) 88 MCG tablet Take 88 mcg by mouth daily.        . rosuvastatin (CRESTOR) 5 MG tablet Take 5 mg by mouth daily.      Marland Kitchen telmisartan (MICARDIS) 40 MG tablet Take 1 tablet (40 mg total) by mouth daily.  30 tablet  6   No current facility-administered medications on file prior to visit.    Allergies: No Known Allergies   Review of Systems:  CONSTITUTIONAL: No fevers, chills, night sweats, or weight loss.   EYES: No visual changes  or eye pain ENT: No hearing changes.  No history of nose bleeds.   RESPIRATORY: No cough, wheezing and shortness of breath.   CARDIOVASCULAR: Negative for chest pain, and palpitations.   GI: Negative for abdominal discomfort, blood in stools or black stools.  No recent change in bowel habits.   GU:  No history of incontinence.   MUSCLOSKELETAL: No history of joint pain or swelling.  No myalgias.   SKIN: Negative for lesions, rash, and itching.   ENDOCRINE:  Negative for cold or heat intolerance, polydipsia or goiter.   PSYCH:  No depression or anxiety symptoms.   NEURO: As Above.   Vital Signs:  BP 100/60  Pulse 106  Ht _0  (1.676 m)  Wt 114 lb 4 oz (51.823 kg)  BMI 18.45 kg/m2  SpO2 92% Thin-appearing  Neurological Exam: MENTAL STATUS including orientation to time, place, person is normal.  Speech is not dysarthric.   CRANIAL NERVES:  Pupils equal round and reactive to light.  Normal conjugate, extra-ocular eye movements in all directions of gaze.  No ptosis. Face is symmetric and facial muscles are intact.  Tongue strength is normal. Snout is present (L>R).   MOTOR: Muscle bulk is reduced throughout. No fasciculations.   Right Upper Extremity:    Left Upper Extremity:    Deltoid  5/5   Deltoid  5/5   Biceps  5/5   Biceps  5/5   Triceps  5/5   Triceps  5-/5   Medial pectoralis  5-/5   Medial pectoralis  4+/5   Infraspinatus  4+/5   Infraspinatus  4/5   Wrist extensors  5/5   Wrist extensors  5/5   Wrist flexors  5/5   Wrist flexors  5/5   Finger extensors  5/5   Finger extensors  5/5   Finger flexors  5/5   Finger flexors  5/5   Dorsal interossei  5/5   Dorsal interossei  5/5   Abductor pollicis  5/5   Abductor pollicis  5/5   Tone (Ashworth scale)  0   Tone (Ashworth scale)  0    Right Lower Extremity:    Left Lower Extremity:    Hip flexors  5/5   Hip flexors  5/5   Hip extensors  5/5   Hip extensors  5/5   Knee flexors  5/5   Knee flexors  5/5   Knee extensors  5/5   Knee extensors  5/5   Dorsiflexors  5/5   Dorsiflexors  5/5   Plantarflexors  5/5   Plantarflexors  5/5   Toe extensors  5/5   Toe extensors  5/5   Toe flexors  5/5   Toe flexors  5/5   Tone (Ashworth scale)  0   Tone (Ashworth scale)  0    MSRs:  Right      Left  brachioradialis  3+   brachioradialis  3+   biceps  3+   biceps  3+   triceps  3+   triceps  2+   patellar  2+   patellar  3+   ankle jerk  1+   ankle jerk  1+   Hoffman  no   Hoffman  no    plantar response  down   plantar response  down    SENSORY:  Vibration intact throughout  GAIT:  Gait narrow-based and stable.  Data: MRI brain and cervical spine 04/13/2014: Mild chronic microvascular ischemic change in the white matter. No  acute intracranial abnormality.  Moderate to advanced cervical spondylosis. Central disc protrusions at C3-4 and C4-5 and C5-6 causing spinal stenosis and foraminal encroachment. This is most severe at C3-4 and C4-5.  EMG 04/12/2014:  Extensive electrodiagnostic testing of the left upper extremity, left lower extremity, midthoracic paraspinal muscles (T7 and T11 levels), and bulbar muscles shows:  1. Normal median, ulnar, radial, superficial peroneal, and sural sensory responses. 2. Normal median, ulnar, peroneal, and tibial motor responses. 3. In the arms, chronic motor axon loss changes are seen affecting the first dorsal interosseous and pronator teres muscle with active changes present in the pronator teres and triceps muscles. 4. In the legs, chronic motor axon loss changes are seen affecting L2-S1 myotomes with active denervation isolated to the anterior tibialis and flexor digitorum longus muscles. 5. Active motor axon loss is present in the thoracic paraspinal muscles at T7 and T11. 6. There is evidence of active and chronic motor axon loss changes affecting the hyoglossus muscle.  7. Fasciculation potentials are rare and seen in 2 of 18 tested muscles.  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 Her neurological exam is stable and shows pathological facial reflexes and generalized hyperreflexia, worse on the left side, and mild proximal arm weakness.  MRI brain shows no abnormalities to explain her bulbar findings. MRI of the cervical spine shows central disc protrusions at C3 - C6 causing spinal stenosis and foraminal encroachment, which may be contributing to her hyperreflexia, however would not explain bulbar findings. CKs have always been borderline elevated. 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  Labs looking for extrinsic causes of injury to motor neuron are notable for severely low copper and zinc levels. She also has borderline low vitamin B12.  GM1 antibody, SPEP/UPEP, ANA, SSA/B, RF, and SCL antibody is negative.    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  She has tried taking zinc and copper supplements, but stopped them because if GI upset. The issue of whether zinc and copper deficiency caused her dysphagia vs. dysphagia causing reduced PO intake and malnutrition will be difficult to delineate. If symptoms improve after supplementation, this will favor nutrition deficiency causing myeloneuropathy.  If however, symptoms continue to worsen, ALS is most likely.  Weight is stable so hold off on PEG as patient is not interested.     PLAN/RECOMMENDATIONS:  1.  Referral to dietician/nutritionalist for multiple mineral/vitamin deficiencies 2.  She is  unable to take vitamin B12 1053mg, zinc 581mdaily, or copper because if GI upset 3.  Consider PEG going forward  4.  Return to clinic in 3-6 months  The duration of this appointment visit was 20 minutes of face-to-face time with the patient.  Greater than 50% of this time was spent in counseling, explanation of diagnosis, planning of further management, and coordination of care.   Thank you for allowing me to participate in patient's care.  If I can answer any additional questions, I would be pleased to do so.    Sincerely,    Donika K. Posey Pronto, DO

## 2014-08-26 ENCOUNTER — Telehealth: Payer: Self-pay | Admitting: *Deleted

## 2014-08-26 NOTE — Telephone Encounter (Signed)
Order received at the nutrition clinic.  Pamala Hurry will call patient today to schedule appointment.

## 2014-09-27 DIAGNOSIS — E039 Hypothyroidism, unspecified: Secondary | ICD-10-CM | POA: Diagnosis not present

## 2014-09-27 DIAGNOSIS — E162 Hypoglycemia, unspecified: Secondary | ICD-10-CM | POA: Diagnosis not present

## 2014-11-18 ENCOUNTER — Telehealth: Payer: Self-pay | Admitting: Neurology

## 2014-11-18 NOTE — Telephone Encounter (Signed)
Pt called and canceled appt on 11-24-14 and resch to 01-31-15

## 2014-11-24 ENCOUNTER — Ambulatory Visit: Payer: Medicare Other | Admitting: Neurology

## 2015-01-07 DIAGNOSIS — E162 Hypoglycemia, unspecified: Secondary | ICD-10-CM | POA: Diagnosis not present

## 2015-01-07 DIAGNOSIS — E039 Hypothyroidism, unspecified: Secondary | ICD-10-CM | POA: Diagnosis not present

## 2015-01-07 DIAGNOSIS — E1121 Type 2 diabetes mellitus with diabetic nephropathy: Secondary | ICD-10-CM | POA: Diagnosis not present

## 2015-01-13 ENCOUNTER — Other Ambulatory Visit: Payer: Self-pay

## 2015-01-13 DIAGNOSIS — Z1231 Encounter for screening mammogram for malignant neoplasm of breast: Secondary | ICD-10-CM

## 2015-01-20 ENCOUNTER — Ambulatory Visit (INDEPENDENT_AMBULATORY_CARE_PROVIDER_SITE_OTHER): Payer: Medicare Other | Admitting: Gastroenterology

## 2015-01-20 ENCOUNTER — Encounter: Payer: Self-pay | Admitting: Gastroenterology

## 2015-01-20 VITALS — BP 90/52 | HR 96 | Ht 66.0 in | Wt 114.0 lb

## 2015-01-20 DIAGNOSIS — R140 Abdominal distension (gaseous): Secondary | ICD-10-CM | POA: Diagnosis not present

## 2015-01-20 DIAGNOSIS — R1084 Generalized abdominal pain: Secondary | ICD-10-CM

## 2015-01-20 NOTE — Patient Instructions (Addendum)
You have been scheduled for a CT scan of the abdomen and pelvis at [facility] ([address]---this is in the same building as [facility]).   You are scheduled on 01-25-15 at 10:00am. You should arrive 15 minutes prior to your appointment time for registration. Please follow the written instructions below on the day of your exam:0  WARNING: IF YOU ARE ALLERGIC TO IODINE/X-RAY DYE, PLEASE NOTIFY RADIOLOGY IMMEDIATELY AT [phone]! YOU WILL BE GIVEN A 13 HOUR PREMEDICATION PREP.  1) Do not eat or drink anything after 6:00am (4 hours prior to your test) 2) You have been given 2 bottles of oral contrast to drink. The solution may taste               better if refrigerated, but do NOT add ice or any other liquid to this solution. Shake             well before drinking.    Drink 1 bottle of contrast @ 8:00am(2 hours prior to your exam)  Drink 1 bottle of contrast @ 9:00am (1 hour prior to your exam)  You may take any medications as prescribed with a small amount of water except for the following: Metformin, Glucophage, Glucovance, Avandamet, Riomet, Fortamet, Actoplus Met, Janumet, Glumetza or Metaglip. The above medications must be held the day of the exam AND 48 hours after the exam.  The purpose of you drinking the oral contrast is to aid in the visualization of your intestinal tract. The contrast solution may cause some diarrhea. Before your exam is started, you will be given a small amount of fluid to drink. Depending on your individual set of symptoms, you may also receive an intravenous injection of x-ray contrast/dye. Plan on being at [facility] for 30 minutes or long, depending on the type of exam you are having performed.  This test typically takes 30-45 minutes to complete.  If you have any questions regarding your exam or if you need to reschedule, you may call the CT department at [phone] between the hours of 8:00 am and 5:00 pm,  Monday-Friday.  ________________________________________________________________________   

## 2015-01-25 ENCOUNTER — Ambulatory Visit (INDEPENDENT_AMBULATORY_CARE_PROVIDER_SITE_OTHER)
Admission: RE | Admit: 2015-01-25 | Discharge: 2015-01-25 | Disposition: A | Discharge status: Home / Self Care | Payer: Medicare Other | Source: Ambulatory Visit | Attending: Gastroenterology | Admitting: Gastroenterology

## 2015-01-25 ENCOUNTER — Other Ambulatory Visit (INDEPENDENT_AMBULATORY_CARE_PROVIDER_SITE_OTHER): Payer: Medicare Other

## 2015-01-25 DIAGNOSIS — K7689 Other specified diseases of liver: Secondary | ICD-10-CM | POA: Diagnosis not present

## 2015-01-25 DIAGNOSIS — R140 Abdominal distension (gaseous): Secondary | ICD-10-CM | POA: Diagnosis not present

## 2015-01-25 DIAGNOSIS — N281 Cyst of kidney, acquired: Secondary | ICD-10-CM | POA: Diagnosis not present

## 2015-01-25 DIAGNOSIS — R1084 Generalized abdominal pain: Secondary | ICD-10-CM

## 2015-01-25 DIAGNOSIS — K5900 Constipation, unspecified: Secondary | ICD-10-CM | POA: Diagnosis not present

## 2015-01-25 LAB — BASIC METABOLIC PANEL
BUN: 8 mg/dL (ref 6–23)
CHLORIDE: 104 meq/L (ref 96–112)
CO2: 30 meq/L (ref 19–32)
Calcium: 10.4 mg/dL (ref 8.4–10.5)
Creatinine, Ser: 0.84 mg/dL (ref 0.40–1.20)
GFR: 87 mL/min (ref >60.00)
Glucose, Bld: 101 mg/dL — ABNORMAL HIGH (ref 70–99)
Potassium: 4.3 mEq/L (ref 3.5–5.1)
Sodium: 140 mEq/L (ref 135–145)

## 2015-01-25 MED ORDER — IOHEXOL 300 MG/ML  SOLN
100.0000 mL | Freq: Once | INTRAMUSCULAR | Status: AC | PRN
Start: 2015-01-25 — End: 2015-01-25
  Administered 2015-01-25: 100 mL via INTRAVENOUS

## 2015-01-27 ENCOUNTER — Encounter: Payer: Self-pay | Admitting: Gastroenterology

## 2015-01-27 NOTE — Progress Notes (Signed)
     01/20/2015 Kristin [name] [id number] 03/23/1946   History of Present Illness:  This is a 69 year old female who is previously known to Dr. Henrene Pastor for complaints of chronic bloating.  She had colonoscopy in 04/2012 at which time the exam was limited due to poor prep.  That was followed up by a barium enema, which was also inadequate due to poor colon prep so Dr. Henrene Pastor has recommended a repeat attempt at colonoscopy at 5 year interval with two day colon prep needed at that time.  She had EGD in 12/2010 for dysphagia at which time the study was normal.  Previous other evaluations have also been negative including abdominal ultrasound in December 2010, gastric emptying scan February 2012, and CT scan of the abdomen and pelvis 2012.  She is here again today with complaints of feeling full and bloated and also with alternating constipation and diarrhea.  She denies seeing any blood in her stools.  No nausea, vomiting, or fevers.  She denies any actual abdominal pain, but just discomfort at times.  Her weight is stable from last year at this time, however, since 06/2011 she has lost 23 pounds.  Has chronic complaints of dysphagia as well.  This is oropharyngeal/transfer dysphagia.  She has seen neurology and they have diagnosed her with a motor-neuron disorder.  They had recommended that she take Zinc and Copper, but says that she could not tolerate them because they made her stomach hurt.  She plans to follow-up with them.   Current Medications, Allergies, Past Medical History, Past Surgical History, Family History and Social History were reviewed in [employer] record.   Physical Exam: BP 90/52 mmHg  Pulse 96  Ht 5\' 6"  (1.676 m)  Wt 114 lb (51.71 kg)  BMI 18.41 kg/m2 General: Thin black female in no acute distress Head: Normocephalic and atraumatic Eyes:  Sclerae anicteric, conjunctiva pink  Ears: Normal auditory acuity Lungs: Clear throughout to auscultation Heart:  Regular rate and rhythm Abdomen: Soft, non-distended.  Normal bowel sounds.  Non-tender.   Musculoskeletal: Symmetrical with no gross deformities  Extremities: No edema  Neurological: Alert oriented x 4, grossly non-focal Psychological:  Alert and cooperative. Normal mood and affect  Assessment and Recommendations: -69 year old female with complaints of abdominal discomfort and bloating/fullness with alternating constipation and diarrhea:  These symptoms are chronic, but worse recently.  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  Will check CT scan of the abdomen and pelvis with contrast since she has not had an cross-sectional imaging in quite some time.  Need to check BMP today as well. 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

## 2015-01-28 DIAGNOSIS — R140 Abdominal distension (gaseous): Secondary | ICD-10-CM | POA: Insufficient documentation

## 2015-01-28 DIAGNOSIS — R1084 Generalized abdominal pain: Secondary | ICD-10-CM | POA: Insufficient documentation

## 2015-01-30 NOTE — Progress Notes (Signed)
Agree 

## 2015-01-31 ENCOUNTER — Encounter: Payer: Self-pay | Admitting: Neurology

## 2015-01-31 ENCOUNTER — Ambulatory Visit (INDEPENDENT_AMBULATORY_CARE_PROVIDER_SITE_OTHER): Payer: Medicare Other | Admitting: Neurology

## 2015-01-31 VITALS — BP 100/60 | HR 103 | Ht 66.0 in | Wt 109.0 lb

## 2015-01-31 DIAGNOSIS — G1220 Motor neuron disease, unspecified: Secondary | ICD-10-CM

## 2015-01-31 DIAGNOSIS — R1310 Dysphagia, unspecified: Secondary | ICD-10-CM | POA: Diagnosis not present

## 2015-01-31 DIAGNOSIS — E60 Dietary zinc deficiency: Secondary | ICD-10-CM

## 2015-01-31 DIAGNOSIS — E538 Deficiency of other specified B group vitamins: Secondary | ICD-10-CM | POA: Diagnosis not present

## 2015-01-31 DIAGNOSIS — E610 Copper deficiency: Secondary | ICD-10-CM

## 2015-01-31 NOTE — Progress Notes (Signed)
Follow-up Visit   Date: 01/31/2015    Kristin [name] MRN: [id number] DOB: [birth year]   Interim History: Kristin [name] is a 69 y.o. 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    History of present illness: Starting around 2011, she developed gradual and progressive difficulty swallowing where she has now lost 10 pounds in a 2 year period due to inability to swallow food. She is currently on a soft food diet and eats yogurt, ice cream, and soups. She tries to drink ensure, but developed abdominal pain. She is not having any significant problems swallowing liquids. She feels as if her symptoms are episodic at times and has problems even swallowing her saliva.   She has mild weakness of the left arm because she is unable to lift heavy objects as well as previously. She denies any slurring of speech, muscle twitches, or cramps. She endorses shortness of breath with exertion. She sleeps on 3-pillows since 2013.   She has previously been evaluated by ENT and GI with negative workup. 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 Modified barium swallow was ordered, however patient was unable to complete the study due to intolerance of testing and inability to swallow.   She was seen by my colleague, Dr. Delice Lesch, who ordered electrodiagnostic testing and labs. 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 She was referred to me for ongoing management.  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  She is here to discuss the results of her recent lab testing which showed markedly low copper and zinc levels.  She was instructed to start supplementation but has been unable to find copper tablets.  She is taking zinc 39m but it gives her a lot of GI irritation so stopped it.   - Follow-up 08/20/2014:  At her last visit, [facility] but she has been unable to tolerate this due to GI upset.    She feels that her dysphagia has been stable, she has some good days and bad days.  Her weight has remained stable since April.    - Follow-up 4/4/20016:  No new complaints today.  She continue to have problems with dysphagia and is still eating tubs of icecream.  Denies any new weakness, muscle twitches, spasms, hospitalizations, or falls.  She is staying active with hobbies at home.  She is not interested in PEG.   Medications:  Current Outpatient Prescriptions on File Prior to Visit  Medication Sig Dispense Refill  . albuterol-ipratropium (COMBIVENT) 18-103 MCG/ACT inhaler Inhale 2 puffs into the lungs every 6 (six) hours as needed for wheezing or shortness of breath.    . hydrochlorothiazide 25 MG tablet Take 25 mg by mouth daily.      .Marland Kitchenlevothyroxine (SYNTHROID, LEVOTHROID) 88 MCG tablet Take 88 mcg by mouth daily.      . rosuvastatin (CRESTOR) 5 MG tablet Take 5 mg by mouth daily.    .Marland Kitchentelmisartan (MICARDIS) 40 MG  tablet Take 1 tablet (40 mg total) by mouth daily. 30 tablet 6   No current facility-administered medications on file prior to visit.    Allergies: No Known Allergies   Review of Systems:  CONSTITUTIONAL: No fevers, chills, night sweats, or weight loss.   EYES: No visual changes or eye pain ENT: No hearing changes.  No history of nose bleeds.   RESPIRATORY: No cough, wheezing and shortness of breath.   CARDIOVASCULAR:  Negative for chest pain, and palpitations.   GI: Negative for abdominal discomfort, blood in stools or black stools.  No recent change in bowel habits.   GU:  No history of incontinence.   MUSCLOSKELETAL: No history of joint pain or swelling.  No myalgias.   SKIN: Negative for lesions, rash, and itching.   ENDOCRINE: Negative for cold or heat intolerance, polydipsia or goiter.   PSYCH:  No depression or anxiety symptoms.   NEURO: As Above.   Vital Signs:  BP 100/60 mmHg  Pulse 103  Ht 5' 6"  (1.676 m)  Wt 109 lb (49.442 kg)  BMI 17.60 kg/m2  SpO2 96%  Neurological Exam: MENTAL STATUS including orientation to time, place, person is normal.  Speech is not dysarthric.   CRANIAL NERVES:  Pupils equal round and reactive to light.  Normal conjugate, extra-ocular eye movements in all directions of gaze.  No ptosis. Face is symmetric and facial muscles are intact.  Tongue strength is normal. Snout is present (L>R).  No tongue fasciculations.  MOTOR: Muscle bulk is reduced throughout. No fasciculations.   Right Upper Extremity:    Left Upper Extremity:    Deltoid  5/5   Deltoid  5/5   Biceps  5/5   Biceps  5/5   Triceps  5/5   Triceps  5-/5   Medial pectoralis  5-/5   Medial pectoralis  4+/5   Infraspinatus  4+/5   Infraspinatus  4/5   Wrist extensors  5/5   Wrist extensors  5/5   Wrist flexors  5/5   Wrist flexors  5/5   Finger extensors  5/5   Finger extensors  5/5   Finger flexors  5/5   Finger flexors  5/5   Dorsal interossei  5/5   Dorsal interossei  5/5   Abductor pollicis  5/5   Abductor pollicis  5/5   Tone (Ashworth scale)  0   Tone (Ashworth scale)  0    Right Lower Extremity:    Left Lower Extremity:    Hip flexors  5/5   Hip flexors  5/5   Hip extensors  5/5   Hip extensors  5/5   Knee flexors  5/5   Knee flexors  5/5   Knee extensors  5/5   Knee extensors  5/5   Dorsiflexors  5/5   Dorsiflexors  5/5   Plantarflexors  5/5   Plantarflexors  5/5   Toe extensors  5/5    Toe extensors  5/5   Toe flexors  5/5   Toe flexors  5/5   Tone (Ashworth scale)  0   Tone (Ashworth scale)  0    MSRs:  Right      Left  brachioradialis  3+   brachioradialis  3+   biceps  3+   biceps  3+   triceps  3+   triceps  2+   patellar  2+   patellar  3+   ankle jerk  1+   ankle jerk  1+  Hoffman  no   Hoffman  no   plantar response  down   plantar response  down    GAIT:  Gait narrow-based and stable.  Data: MRI brain and cervical spine 04/13/2014: Mild chronic microvascular ischemic change in the white matter. No acute intracranial abnormality.  Moderate to advanced cervical spondylosis. Central disc protrusions at C3-4 and C4-5 and C5-6 causing spinal stenosis and foraminal encroachment. This is most severe at C3-4 and C4-5.   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 Her neurological exam is stable and shows pathological facial reflexes and generalized hyperreflexia, worse on the left side, and mild proximal arm weakness.  MRI brain shows no abnormalities to explain her bulbar findings. MRI of the cervical spine shows central disc protrusions at C3 - C6 causing spinal stenosis and foraminal encroachment, which may be contributing to her hyperreflexia, however would not explain bulbar findings. CKs have  always been borderline elevated. 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  Labs looking for extrinsic causes of injury to motor neuron are notable for severely low copper and zinc levels. She also has borderline low vitamin B12.  GM1 antibody, SPEP/UPEP, ANA, SSA/B, RF, and SCL antibody is negative.    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  She has tried taking zinc and copper supplements, but stopped them because if GI upset. The issue of whether zinc and copper deficiency caused her dysphagia vs. dysphagia causing reduced PO intake and malnutrition will be difficult to delineate. If symptoms improve after supplementation, this will favor nutrition deficiency causing myeloneuropathy.  If however, symptoms continue to worsen, ALS is most likely.  Unfortunately, she is unable to tolerate supplements. She is aware of the risks and benefits and knows that supplementation may help, but has not been able to find anything that does not cause GI upset.  I offered to start injections for vitamin B12, but she cannot come to the office for them or administered it at home.    Clinically, she has remained stable without any true progression over the past 6 months.  I encouraged he try vitamin B12, copper, and zinc supplementation.  We also discussed PEG placement again as to supplement her nutrition, which she will think about.  Return to clinic in 1 year or sooner as needed.   The duration of this appointment visit was 20 minutes of face-to-face time with the patient.  Greater than 50% of this time was spent in counseling, explanation of diagnosis, planning of further management, and coordination of  care.   Thank you for allowing me to participate in patient's  care.  If I can answer any additional questions, I would be pleased to do so.    Sincerely,    Justine Cossin K. Posey Pronto, DO

## 2015-02-18 ENCOUNTER — Ambulatory Visit
Admission: RE | Admit: 2015-02-18 | Discharge: 2015-02-18 | Disposition: A | Discharge status: Home / Self Care | Payer: Medicare Other | Source: Ambulatory Visit

## 2015-02-18 DIAGNOSIS — Z1231 Encounter for screening mammogram for malignant neoplasm of breast: Secondary | ICD-10-CM | POA: Diagnosis not present

## 2015-02-24 ENCOUNTER — Encounter: Payer: Self-pay | Admitting: Gastroenterology

## 2015-02-25 ENCOUNTER — Other Ambulatory Visit (INDEPENDENT_AMBULATORY_CARE_PROVIDER_SITE_OTHER): Payer: Medicare Other

## 2015-02-25 ENCOUNTER — Encounter: Payer: Self-pay | Admitting: Family

## 2015-02-25 ENCOUNTER — Ambulatory Visit (INDEPENDENT_AMBULATORY_CARE_PROVIDER_SITE_OTHER): Payer: Medicare Other | Admitting: Family

## 2015-02-25 VITALS — BP 100/68 | HR 97 | Temp 98.0°F | Resp 18 | Ht 66.0 in | Wt 111.0 lb

## 2015-02-25 DIAGNOSIS — J449 Chronic obstructive pulmonary disease, unspecified: Secondary | ICD-10-CM | POA: Diagnosis not present

## 2015-02-25 DIAGNOSIS — I10 Essential (primary) hypertension: Secondary | ICD-10-CM | POA: Diagnosis not present

## 2015-02-25 DIAGNOSIS — E039 Hypothyroidism, unspecified: Secondary | ICD-10-CM | POA: Diagnosis not present

## 2015-02-25 LAB — TSH: TSH: 0.8 u[IU]/mL (ref 0.35–4.50)

## 2015-02-25 MED ORDER — SYNTHROID 88 MCG PO TABS: 88.0000 ug | ORAL_TABLET | Freq: Every day | ORAL | Status: DC

## 2015-02-25 MED ORDER — LOSARTAN POTASSIUM 50 MG PO TABS: 50.0000 mg | ORAL_TABLET | Freq: Every day | ORAL | Status: DC

## 2015-02-25 NOTE — Progress Notes (Signed)
Subjective:    Patient ID: Kristin [name], female    DOB: 07/18/1946, 69 y.o.   MRN: [id number]  Chief Complaint  Patient presents with  . Establish Care    Insurance will not cover micardis, wants to see if there is something else she can get, was told losartan or valsartan would be covered    HPI:  Kristin [name] is a 69 y.o. female with a PMH of COPD, GERD, hypothyroidism, and hypertension who presents today for an office visit to establish care.   1) Hypertension - Currently maintained on hydrochlorothiazide and telmisartan. Denies any side effects of medications and states she takes her medications as prescribed. Notes that the Micardis is no longer covered on primary insurance and would like to switch to another ARB indicating losartan or valsartan are both covered.  BP Readings from Last 3 Encounters:  02/25/15 100/68  01/31/15 100/60  01/20/15 90/52    2) Hypothyroidism - currently maintained on levothyroxine. Denies any adverse effects. Indicates that she is taking the medication as prescribed.   Lab Results  Component Value Date   TSH 1.052 04/20/2010    3) COPD - currently maintained on combivent which helps to control her symptoms. Uses it mainly during the seasonal allergies. Indicates she feels it is well controlled this time.   No Known Allergies   Current Outpatient Prescriptions on File Prior to Visit  Medication Sig Dispense Refill  . albuterol-ipratropium (COMBIVENT) 18-103 MCG/ACT inhaler Inhale 2 puffs into the lungs every 6 (six) hours as needed for wheezing or shortness of breath.    . hydrochlorothiazide 25 MG tablet Take 25 mg by mouth daily.      Marland Kitchen levothyroxine (SYNTHROID, LEVOTHROID) 88 MCG tablet Take 88 mcg by mouth daily.      . rosuvastatin (CRESTOR) 5 MG tablet Take 5 mg by mouth daily.     No current facility-administered medications on file prior to visit.    Past Medical History  Diagnosis Date  . Vitiligo   . Degeneration of  cervical intervertebral disc   . Cervicalgia   . Unspecified vitamin D deficiency   . Esophageal reflux   . Personal history of peptic ulcer disease   . Acute sinusitis, unspecified   . Chest pain, unspecified   . COPD (chronic obstructive pulmonary disease)   . Obstructive chronic bronchitis with exacerbation   . Unspecified hypothyroidism   . Unspecified essential hypertension   . Hyperlipidemia   . Diverticulosis of colon (without mention of hemorrhage)   . Motor neuron disease     Past Surgical History  Procedure Laterality Date  . Abdominal hysterectomy w/ partial vaginactomy    . Thyroidectomy, partial    . Tonsillectomy    . Colonoscopy  05/19/2012  . Esophagogastroduodenoscopy  01/03/2011    normal   . Breast biopsy Right     Family History  Problem Relation Age of Onset  . Diabetes Mother     everyone on both sides  . Hypertension Mother     everyone on both sides  . Heart attack Mother   . Colon cancer Neg Hx   . Stomach cancer Neg Hx   . Esophageal cancer Neg Hx   . Rectal cancer Neg Hx   . Heart attack Maternal Grandmother   . Diabetes Maternal Grandmother   . Hypertension Maternal Grandmother   . Diabetes Maternal Grandfather   . Hypertension Maternal Grandfather   . Diabetes Paternal Grandmother   . Hypertension Paternal Grandmother   .  Diabetes Paternal Grandfather   . Hypertension Paternal Grandfather     History   Social History  . Marital Status: Divorced    Spouse Name: N/A  . Number of Children: 3  . Years of Education: 20+   Occupational History  . Retired    Social History Main Topics  . Smoking status: Current Some Day Smoker -- 0.50 packs/day for 50 years    Types: Cigarettes  . Smokeless tobacco: Never Used     Comment: form given 02-18-14  . Alcohol Use: No  . Drug Use: No  . Sexual Activity: Not on file   Other Topics Concern  . Not on file   Social History Narrative   She lives alone.  She moved to [place] in 1998.   She has three grown children (2 sons, 1 daughter).  One son lives in [place].   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        Review of Systems  Eyes:       Negative for changes in vision.  Respiratory: Negative for chest tightness.   Cardiovascular: Negative for chest pain, palpitations and leg swelling.  Endocrine: Negative for cold intolerance and heat intolerance.      Objective:    BP 100/68 mmHg  Pulse 97  Temp(Src) 98 F (36.7 C) (Oral)  Resp 18  Ht 5\' 6"  (1.676 m)  Wt 111 lb (50.349 kg)  BMI 17.92 kg/m2  SpO2 95% Nursing note and vital signs reviewed.  Physical Exam  Constitutional: She is oriented to person, place, and time. She appears well-developed and well-nourished. No distress.  Neck: Neck supple. No thyromegaly present.  Cardiovascular: Normal rate, regular rhythm, normal heart sounds and intact distal pulses.   Pulmonary/Chest: Effort normal and breath sounds normal.  Neurological: She is alert and oriented to person, place, and time.  Skin: Skin is warm and dry.  Psychiatric: She has a normal mood and affect. Her behavior is normal. Judgment and thought content normal.       Assessment & Plan:

## 2015-02-25 NOTE — Assessment & Plan Note (Signed)
Stable with current regimen of Combivent. Patient currently uses medication during seasonal allergy times. Continue current dosage of Combivent. Changes per pulmonology or as needed.

## 2015-02-25 NOTE — Progress Notes (Signed)
Pre visit review using our clinic review tool, if applicable. No additional management support is needed unless otherwise documented below in the visit note. 

## 2015-02-25 NOTE — Patient Instructions (Addendum)
Thank you for choosing [facility].  Summary/Instructions:  Your prescription(s) have been submitted to your pharmacy or been printed and provided for you. Please take as directed and contact our office if you believe you are having problem(s) with the medication(s) or have any questions.  Please stop by the lab on the basement level of the building for your blood work. Your results will be released to MyChart (or called to you) after review, usually within 72 hours after test completion. If any changes need to be made, you will be notified at that same time.  If your symptoms worsen or fail to improve, please contact our office for further instruction, or in case of emergency go directly to the emergency room at the closest medical facility.     

## 2015-02-25 NOTE — Assessment & Plan Note (Addendum)
Blood pressure stable with current regimen and below goal 140/90. Question need for all medications. Discussed with patient possibly tapering her medication. She prefers at this time to remain on her current medications, however will consider tapering in the future. Continue current dosage of hydrochlorothiazide. Start losartan. Follow-up in one month.

## 2015-02-25 NOTE — Assessment & Plan Note (Signed)
Stable with current regimen of levothyroxine. Obtain TSH to determine current status. Continue current dosage of levothyroxine. Follow-up pending lab results.

## 2015-03-04 ENCOUNTER — Telehealth: Payer: Self-pay | Admitting: Family

## 2015-03-04 NOTE — Telephone Encounter (Signed)
Received record from [place] sent to Dr. Mauricio Po 03/04/15 fbg.

## 2015-05-13 ENCOUNTER — Other Ambulatory Visit: Payer: Self-pay

## 2015-05-13 MED ORDER — LOSARTAN POTASSIUM 50 MG PO TABS: 50.0000 mg | ORAL_TABLET | Freq: Every day | ORAL | Status: DC

## 2015-06-14 ENCOUNTER — Encounter: Payer: Self-pay | Admitting: Family

## 2015-06-14 ENCOUNTER — Ambulatory Visit (INDEPENDENT_AMBULATORY_CARE_PROVIDER_SITE_OTHER): Payer: Medicare Other | Admitting: Family

## 2015-06-14 ENCOUNTER — Other Ambulatory Visit (INDEPENDENT_AMBULATORY_CARE_PROVIDER_SITE_OTHER): Payer: Medicare Other

## 2015-06-14 VITALS — BP 120/84 | HR 98 | Temp 97.8°F | Resp 18 | Ht 66.0 in | Wt 113.0 lb

## 2015-06-14 DIAGNOSIS — E039 Hypothyroidism, unspecified: Secondary | ICD-10-CM

## 2015-06-14 DIAGNOSIS — E782 Mixed hyperlipidemia: Secondary | ICD-10-CM

## 2015-06-14 DIAGNOSIS — I10 Essential (primary) hypertension: Secondary | ICD-10-CM

## 2015-06-14 LAB — LIPID PANEL
CHOLESTEROL: 120 mg/dL (ref 0–200)
HDL: 55.4 mg/dL (ref >39.00)
LDL CALC: 52 mg/dL (ref 0–99)
NonHDL: 64.47
TRIGLYCERIDES: 61 mg/dL (ref 0.0–149.0)
Total CHOL/HDL Ratio: 2
VLDL: 12.2 mg/dL (ref 0.0–40.0)

## 2015-06-14 MED ORDER — ROSUVASTATIN CALCIUM 5 MG PO TABS: 5.0000 mg | ORAL_TABLET | Freq: Every day | ORAL | Status: DC

## 2015-06-14 MED ORDER — HYDROCHLOROTHIAZIDE 25 MG PO TABS: 25.0000 mg | ORAL_TABLET | Freq: Every day | ORAL | Status: DC

## 2015-06-14 MED ORDER — IPRATROPIUM-ALBUTEROL 18-103 MCG/ACT IN AERO
2.0000 | INHALATION_SPRAY | Freq: Four times a day (QID) | RESPIRATORY_TRACT | Status: DC | PRN
Start: 2015-06-14 — End: 2015-09-14

## 2015-06-14 MED ORDER — LOSARTAN POTASSIUM 50 MG PO TABS: 50.0000 mg | ORAL_TABLET | Freq: Every day | ORAL | Status: DC

## 2015-06-14 MED ORDER — SYNTHROID 88 MCG PO TABS: 88.0000 ug | ORAL_TABLET | Freq: Every day | ORAL | Status: DC

## 2015-06-14 NOTE — Progress Notes (Signed)
Pre visit review using our clinic review tool, if applicable. No additional management support is needed unless otherwise documented below in the visit note. 

## 2015-06-14 NOTE — Assessment & Plan Note (Signed)
Stable with current dose of Crestor. Denies myalgias. Obtain lipid profile. Continue current dosage of Crestor pending lab results.

## 2015-06-14 NOTE — Assessment & Plan Note (Signed)
Stable with current dosage of hydrochlorothiazide and losartan. Blood pressure remained below goal 140/90 while off of losartan. Patient wishes to continue current medications as prescribed. Continue current dosage of hydrochlorothiazide and losartan. Follow-up in 6 months.

## 2015-06-14 NOTE — Progress Notes (Signed)
Subjective:    Patient ID: Kristin [name], female    DOB: 1946-05-07, 69 y.o.   MRN: [id number]  Chief Complaint  Patient presents with  . Follow-up    States following up on BP, states that since she has stopped taking the cozaar she has felt bad and would like to go back on it, would like a refill of all meds    HPI:  Kristin [name] is a 69 y.o. female with a PMH of hypertension, vitiligo, peptic ulcer disease, neck pain, GERD, fatigue, early satiety, diabetes, and degenerative disc disease who presents today for an office follow up.     1.) Blood pressure - Currently maintained on the losartan and hydrochlorothizide. Notes that she ran out of the losartan and did not have refills. Takes the HCTZ as prescribed and denies adverse side effects. Does not currently take her blood pressure at home.   BP Readings from Last 3 Encounters:  06/14/15 120/84  02/25/15 100/68  01/31/15 100/60    2.) Hyperlipidemia - Currently maintained on Crestor. Takes the medication as prescribed and denies myalgias or adverse side effects. Is fasting today.  Lab Results  Component Value Date   CHOL 112 09/14/2010   HDL 41 09/14/2010   LDLCALC 52 09/14/2010   TRIG 96 09/14/2010   CHOLHDL 2.7 Ratio 09/14/2010    No Known Allergies   No current outpatient prescriptions on file prior to visit.   No current facility-administered medications on file prior to visit.     Review of Systems  Constitutional: Positive for fatigue. Negative for fever and chills.  Eyes:       Negative for changes in vision.  Respiratory: Negative for chest tightness.   Cardiovascular: Negative for chest pain, palpitations and leg swelling.  Neurological: Negative for headaches.      Objective:    BP 120/84 mmHg  Pulse 98  Temp(Src) 97.8 F (36.6 C) (Oral)  Resp 18  Ht 5\' 6"  (1.676 m)  Wt 113 lb (51.256 kg)  BMI 18.25 kg/m2  SpO2 97% Nursing note and vital signs reviewed.  Physical Exam    Constitutional: She is oriented to person, place, and time. She appears well-developed and well-nourished. No distress.  Cardiovascular: Normal rate, regular rhythm, normal heart sounds and intact distal pulses.   Pulmonary/Chest: Effort normal and breath sounds normal.  Neurological: She is alert and oriented to person, place, and time.  Skin: Skin is warm and dry.  Psychiatric: She has a normal mood and affect. Her behavior is normal. Judgment and thought content normal.       Assessment & Plan:   Problem List Items Addressed This Visit      Cardiovascular and Mediastinum   Essential hypertension    Stable with current dosage of hydrochlorothiazide and losartan. Blood pressure remained below goal 140/90 while off of losartan. Patient wishes to continue current medications as prescribed. Continue current dosage of hydrochlorothiazide and losartan. Follow-up in 6 months.      Relevant Medications   hydrochlorothiazide (HYDRODIURIL) 25 MG tablet   rosuvastatin (CRESTOR) 5 MG tablet   losartan (COZAAR) 50 MG tablet     Endocrine   Hypothyroidism    Levothyroxine refilled.      Relevant Medications   SYNTHROID 88 MCG tablet     Other   HYPERLIPIDEMIA, MIXED - Primary    Stable with current dose of Crestor. Denies myalgias. Obtain lipid profile. Continue current dosage of Crestor pending lab results.  Relevant Medications   hydrochlorothiazide (HYDRODIURIL) 25 MG tablet   rosuvastatin (CRESTOR) 5 MG tablet   losartan (COZAAR) 50 MG tablet   Other Relevant Orders   Lipid Profile

## 2015-06-14 NOTE — Patient Instructions (Signed)
Thank you for choosing  HealthCare.  Summary/Instructions:  Please continue to take your medications as prescribed.   Your prescription(s) have been submitted to your pharmacy or been printed and provided for you. Please take as directed and contact our office if you believe you are having problem(s) with the medication(s) or have any questions.  Please stop by the lab on the basement level of the building for your blood work. Your results will be released to MyChart (or called to you) after review, usually within 72 hours after test completion. If any changes need to be made, you will be notified at that same time.  If your symptoms worsen or fail to improve, please contact our office for further instruction, or in case of emergency go directly to the emergency room at the closest medical facility.     

## 2015-06-14 NOTE — Assessment & Plan Note (Signed)
Levothyroxine refilled

## 2015-07-08 DIAGNOSIS — Z23 Encounter for immunization: Secondary | ICD-10-CM | POA: Diagnosis not present

## 2015-07-11 DIAGNOSIS — E119 Type 2 diabetes mellitus without complications: Secondary | ICD-10-CM | POA: Diagnosis not present

## 2015-07-11 LAB — HM DIABETES EYE EXAM

## 2015-07-12 ENCOUNTER — Encounter: Payer: Self-pay | Admitting: Family

## 2015-09-13 ENCOUNTER — Other Ambulatory Visit: Payer: Self-pay | Admitting: Family

## 2015-09-14 ENCOUNTER — Other Ambulatory Visit: Payer: Self-pay | Admitting: Family

## 2015-09-20 ENCOUNTER — Encounter: Payer: Self-pay | Admitting: Family

## 2015-10-05 ENCOUNTER — Telehealth: Payer: Self-pay | Admitting: Internal Medicine

## 2015-10-05 NOTE — Telephone Encounter (Signed)
Pt states she has been having problems with abdominal pain, constipation alternating with diarrhea. Pt states she is also having problems with bloating and gas. Pt requests to be seen sooner than 1st available. Pt scheduled to see Cecille Rubin Hvozdovic, PA-C tomorrow at 1:45pm. Pt aware of appt.

## 2015-10-06 ENCOUNTER — Ambulatory Visit (INDEPENDENT_AMBULATORY_CARE_PROVIDER_SITE_OTHER): Payer: Medicare Other | Admitting: Physician Assistant

## 2015-10-06 ENCOUNTER — Encounter: Payer: Self-pay | Admitting: Physician Assistant

## 2015-10-06 VITALS — BP 108/62 | HR 88 | Ht 66.0 in | Wt 109.0 lb

## 2015-10-06 DIAGNOSIS — R140 Abdominal distension (gaseous): Secondary | ICD-10-CM

## 2015-10-06 DIAGNOSIS — K589 Irritable bowel syndrome without diarrhea: Secondary | ICD-10-CM

## 2015-10-06 DIAGNOSIS — R1013 Epigastric pain: Secondary | ICD-10-CM | POA: Diagnosis not present

## 2015-10-06 DIAGNOSIS — R141 Gas pain: Secondary | ICD-10-CM

## 2015-10-06 MED ORDER — GLUCERNA ADVANCE SHAKE PO LIQD: 1.0000 | Freq: Two times a day (BID) | ORAL | Status: DC

## 2015-10-06 MED ORDER — RIFAXIMIN 550 MG PO TABS: 550.0000 mg | ORAL_TABLET | Freq: Three times a day (TID) | ORAL | Status: DC

## 2015-10-06 NOTE — Patient Instructions (Signed)
Please try over the counter Lactaid pills 2 tabs before each meals.  We have sent the following medications to your pharmacy for you to pick up at your convenience: Xifaxan (send to [facility]) we also sent Glucerna to [facility]   Follow up with Dr Rikki Spearing on 11-15-2015 @ 2:15pm

## 2015-10-07 ENCOUNTER — Telehealth: Payer: Self-pay | Admitting: Adult Health

## 2015-10-07 ENCOUNTER — Ambulatory Visit (INDEPENDENT_AMBULATORY_CARE_PROVIDER_SITE_OTHER): Payer: Medicare Other | Admitting: Adult Health

## 2015-10-07 ENCOUNTER — Ambulatory Visit (INDEPENDENT_AMBULATORY_CARE_PROVIDER_SITE_OTHER)
Admission: RE | Admit: 2015-10-07 | Discharge: 2015-10-07 | Disposition: A | Discharge status: Home / Self Care | Payer: Medicare Other | Source: Ambulatory Visit | Attending: Adult Health | Admitting: Adult Health

## 2015-10-07 ENCOUNTER — Encounter: Payer: Self-pay | Admitting: Adult Health

## 2015-10-07 VITALS — BP 94/60 | HR 98 | Temp 97.8°F | Ht 66.0 in | Wt 108.0 lb

## 2015-10-07 DIAGNOSIS — R0602 Shortness of breath: Secondary | ICD-10-CM | POA: Diagnosis not present

## 2015-10-07 DIAGNOSIS — R1313 Dysphagia, pharyngeal phase: Secondary | ICD-10-CM | POA: Diagnosis not present

## 2015-10-07 DIAGNOSIS — J449 Chronic obstructive pulmonary disease, unspecified: Secondary | ICD-10-CM

## 2015-10-07 DIAGNOSIS — Z23 Encounter for immunization: Secondary | ICD-10-CM

## 2015-10-07 MED ORDER — BUDESONIDE-FORMOTEROL FUMARATE 160-4.5 MCG/ACT IN AERO: 2.0000 | INHALATION_SPRAY | Freq: Two times a day (BID) | RESPIRATORY_TRACT | Status: DC

## 2015-10-07 NOTE — Assessment & Plan Note (Signed)
Cont follow up with GI .  

## 2015-10-07 NOTE — Patient Instructions (Addendum)
Begin Symbicort 160 2 puffs Twice daily  , rinse after use.  For now continue on Combivent 2 puffs Twice daily.  Chest xray today .  Continue to work on not smoking .  Prevnar vaccine today .  Follow up in 6 weeks with spirometry .

## 2015-10-07 NOTE — Assessment & Plan Note (Signed)
Not optimally controlled on Combivent Check xray   Plan  Begin Symbicort 160 2 puffs Twice daily  , rinse after use.  For now continue on Combivent 2 puffs Twice daily.  Chest xray today .  Continue to work on not smoking .  Prevnar vaccine today .  Follow up in 6 weeks with spirometry .

## 2015-10-07 NOTE — Telephone Encounter (Signed)
Pt is already scheduled with [facility] in March 2017. Nothing further needed.

## 2015-10-07 NOTE — Progress Notes (Signed)
Quick Note:  Attempted to call pt. Phone kept ringing. Unable to LVM, no VM set up. ______

## 2015-10-07 NOTE — Telephone Encounter (Signed)
Fine with me

## 2015-10-07 NOTE — Progress Notes (Signed)
  Subjective:    Patient ID: Kristin [name], female    DOB: 08/30/1946  MRN: [id number]   Brief patient profile:  18 yobf smoker with emphysema by ct referred by Dr Etter Sjogren 03/20/2013 to pulmonary clinic.   03/20/2013 1st pulmonary eval/ Wert on ACEi cc indolent onset doe worse in hot or cold x 6 years not really progressing assoc with variable cough/ choking/ dysphagia with neg GI w/u and sense of throat congestion but no excess mucus production even in am,  saba helps breathing some and avg use about twice daily. rec The key is to stop smoking completely before smoking completely stops you!  Stop accupril and start micardis 80 mg one half daily plus continue the hctz Please schedule a follow up office visit in 6 weeks, call sooner if needed with pfts    05/05/2013 f/u ov/Wert re copd GOLD III Chief Complaint  Patient presents with  . Followup with PFT    Pt states that her breathing has slightly improved since her last visit. No new co's today.    doe x grocery goes slow flat ok, try to nl pace or [place] then tires out quickly, rare perceived need for saba  >>no changes   10/07/2015 Follow up : COPD GOLD III , smoker  Pt presents for follow up . , Last seen in 2014  Using combivent Twice daily  . Says she feels she gets more sob with activities She is still smoking , but has cut back . We discussed cessation .  Flu utd. We discussed Prevnar vaccine -she is okay with this.  No significant cough or wheezing . Last chest x-ray 2014. She denies any hemoptysis, orthopnea, PND or leg swelling.Marland Kitchen  Has been having a lot of GI issues , has swallow issues , and wt loss.  Following with GI.    Current Medications, Allergies, Past Medical History, Past Surgical History, Family History, and Social History were reviewed in [employer] record.  ROS  The following are not active complaints unless bolded sore throat, dysphagia, dental problems, itching, sneezing,  nasal  congestion or excess/ purulent secretions, ear ache,   fever, chills, sweats, unintended wt loss, pleuritic or exertional cp, hemoptysis,  orthopnea pnd or leg swelling, presyncope, palpitations, heartburn, abdominal pain, anorexia, nausea, vomiting, diarrhea  or change in bowel or urinary habits, change in stools or urine, dysuria,hematuria,  rash, arthralgias, visual complaints, headache, numbness weakness or ataxia or problems with walking or coordination,  change in mood/affect or memory.          Objective:   Physical Exam  amb bf nad  05/05/2013       127>108 10/07/2015    HEENT mild turbinate edema.  Oropharynx no thrush or excess pnd or cobblestoning.  No JVD or cervical adenopathy. Mild accessory muscle hypertrophy. Trachea midline, nl thryroid. Chest was hyperinflated by percussion with diminished breath sounds and moderate increased exp time without wheeze. Hoover sign positive at mid inspiration. Regular rate and rhythm without murmur gallop or rub or increase P2 or edema.  Abd: no hsm, nl excursion. Ext warm without cyanosis or clubbing.      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

## 2015-10-07 NOTE — Telephone Encounter (Signed)
RB please advise if you're ok with taking on pt.  Thanks!

## 2015-10-07 NOTE — Telephone Encounter (Signed)
Last ov with TP on 10/07/15  Per patient's preference she would like to switch providers from [facility] to [facility]  [facility] please advise

## 2015-10-07 NOTE — Telephone Encounter (Signed)
OK with me.

## 2015-10-08 ENCOUNTER — Encounter: Payer: Self-pay | Admitting: Physician Assistant

## 2015-10-08 NOTE — Progress Notes (Signed)
Patient ID: Kristin [name], female   DOB: Apr 17, 1946, 69 y.o.   MRN: [id number]     History of Present Illness: Kristin [name] is a pleasant 69 year old female who is known to Dr. Henrene Pastor and has seen him with complaints of bloating in the past. She last had a colonoscopy in 2013 at which time she was noted to have a very poor prep and the exam was limited. This was followed by a barium enema which was also inadequate due to poor colon prep and so Dr. Henrene Pastor recommended repeat attempt at colonoscopy at a 5 year interval with a 2 day colonoscopy prep needed at that time. She had an EGD in March 2012 for dysphagia at which time the study was normal. Previous other evaluations have also been negative including abdominal ultrasound in December 2010, gastric emptying scan in February 2012, and CT of the abdomen and pelvis in 2012. She was evaluated by Clarita Crane, PA-C in March 2016 with abdominal fullness and bloating as well as complaints of constipation alternating with diarrhea. She was sent for CT of the abdomen and pelvis which she had on 01/25/2015. It revealed prominent stool throughout the colon favoring constipation. She was advised to use magnesia citrate doesn't remember if it helped. She was advised to use Mira lax but has not been doing so recently. She continues to have days of constipation alternating with days of diarrhea. She is now having small broken pieces and stools that are often stringy. She complains of excess gas. She feels gas bubbles throughout the abdomen. She has been using Mylanta with good relief. She denies heartburn, belching or burping    Past Medical History  Diagnosis Date  . Vitiligo   . Degeneration of cervical intervertebral disc   . Cervicalgia   . Unspecified vitamin D deficiency   . Esophageal reflux   . Personal history of peptic ulcer disease   . Acute sinusitis, unspecified   . Chest pain, unspecified   . COPD (chronic obstructive pulmonary disease) ([place])     . Obstructive chronic bronchitis with exacerbation ([place])   . Unspecified hypothyroidism   . Unspecified essential hypertension   . Hyperlipidemia   . Diverticulosis of colon (without mention of hemorrhage)   . 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 Abdominal hysterectomy w/ partial vaginactomy    . Thyroidectomy, partial    . Tonsillectomy    . Colonoscopy  05/19/2012  . Esophagogastroduodenoscopy  01/03/2011    normal   . Breast biopsy Right    Family History  Problem Relation Age of Onset  . Diabetes Mother     everyone on both sides  . Hypertension Mother     everyone on both sides  . Heart attack Mother   . Colon cancer Neg Hx   . Stomach cancer Neg Hx   . Esophageal cancer Neg Hx   . Rectal cancer Neg Hx   . Heart attack Maternal Grandmother   . Diabetes Maternal Grandmother   . Hypertension Maternal Grandmother   . Diabetes Maternal Grandfather   . Hypertension Maternal Grandfather   . Diabetes Paternal Grandmother   . Hypertension Paternal Grandmother   . Diabetes Paternal Grandfather   . Hypertension Paternal Grandfather    Social History  Substance Use Topics  . Smoking status: Current Some Day Smoker -- 0.50 packs/day for 50 years    Types: Cigarettes  . Smokeless tobacco: Never Used  Comment: 3 cigarettes a day  . Alcohol Use: No   Current Outpatient Prescriptions  Medication Sig Dispense Refill  . COMBIVENT RESPIMAT 20-100 MCG/ACT AERS respimat INHALE TWO PUFFS BY MOUTH EVERY 6 HOURS AS NEEDED FOR WHEEZING OR SHORTNESS OF BREATH 4 g 0  . hydrochlorothiazide (HYDRODIURIL) 25 MG tablet Take 1 tablet (25 mg total) by mouth daily. 90 tablet 1  . losartan (COZAAR) 50 MG tablet Take 1 tablet (50 mg total) by mouth daily. 90 tablet 1  . rosuvastatin (CRESTOR) 5 MG tablet Take 1 tablet (5 mg total) by mouth daily. 90 tablet 1  . SYNTHROID 88 MCG tablet Take 1 tablet (88 mcg total) by mouth daily before  breakfast. 90 tablet 1  . budesonide-formoterol (SYMBICORT) 160-4.5 MCG/ACT inhaler Inhale 2 puffs into the lungs 2 (two) times daily. 1 Inhaler 0  . budesonide-formoterol (SYMBICORT) 160-4.5 MCG/ACT inhaler Inhale 2 puffs into the lungs 2 (two) times daily. 1 Inhaler 5  . Nutritional Supplements (GLUCERNA ADVANCE SHAKE) LIQD Take 1 Bottle by mouth 2 (two) times daily. 60 Bottle 2  . rifaximin (XIFAXAN) 550 MG TABS tablet Take 1 tablet (550 mg total) by mouth 3 (three) times daily. 42 tablet 0   No current facility-administered medications for this visit.   No Known Allergies   Review of Systems: Gen: Denies any fever, chills, sweats, anorexia, fatigue, weakness, malaise, weight loss, and sleep disorder CV: Denies chest pain, angina, palpitations, syncope, orthopnea, PND, peripheral edema, and claudication. Resp: Denies dyspnea at rest, dyspnea with exercise, cough, sputum, wheezing, coughing up blood, and pleurisy. GI: Denies vomiting blood, jaundice, and fecal incontinence.   Denies dysphagia or odynophagia. GU : Denies urinary burning, blood in urine, urinary frequency, urinary hesitancy, nocturnal urination, and urinary incontinence. MS: Denies joint pain, limitation of movement, and swelling, stiffness, low back pain, extremity pain. Denies muscle weakness, cramps, atrophy.  Derm: Denies rash, itching, dry skin, hives, moles, warts, or unhealing ulcers.  Psych: Denies depression, anxiety, memory loss, suicidal ideation, hallucinations, paranoia, and confusion. Heme: Denies bruising, bleeding, and enlarged lymph nodes. Neuro:  Denies any headaches, dizziness, paresthesia Endo:  Denies any problems with DM, thyroid, adrenal   Physical Exam: BP 108/62 mmHg  Pulse 88  Ht 5\' 6"  (1.676 m)  Wt 109 lb 0.6 oz (49.459 kg)  BMI 17.61 kg/m2 General: Pleasant, well developed , female in no acute distress Head: Normocephalic and atraumatic Eyes:  sclerae anicteric, conjunctiva pink  Ears:  Normal auditory acuity Lungs: Clear throughout to auscultation Heart: Regular rate and rhythm Abdomen: Soft, non distended, non-tender. No masses, no hepatomegaly. Normal bowel sounds Musculoskeletal: Symmetrical with no gross deformities  Extremities: No edema  Neurological: Alert oriented x 4, grossly nonfocal Psychological:  Alert and cooperative. Normal mood and affect  Assessment and Recommendations:  69 year old female presenting with complaints of abdominal bloating and fullness, excess latch lengths, and stringy stools off and broken into pieces. Her symptoms are likely functional/IBS. He may have a component of small intestinal bacterial overgrowth. She will be given a trial of Xifaxan 550 mg 3 times daily for 14 days. If her insurance will not cover Xifaxan, we can try Flagyl 250 mg 3 times daily for 14 days. She has also been instructed to use lactate pills 2 tabs before each meal. She asked asked about nutritional supplements and was advised that she can try Glucerna one bottle twice daily as needed. She will follow-up in 6 weeks, sooner if needed.  Dalila Arca, Vita Barley PA-C 10/08/2015,

## 2015-10-09 NOTE — Progress Notes (Signed)
Agree with initial assessment and plans. Could consider daily fiber as well

## 2015-10-12 ENCOUNTER — Encounter: Payer: Self-pay | Admitting: *Deleted

## 2015-10-12 NOTE — Progress Notes (Signed)
Quick Note:  ATC, NA and no option to leave VM, WCB ______

## 2015-10-17 ENCOUNTER — Telehealth: Payer: Self-pay | Admitting: Emergency Medicine

## 2015-10-17 NOTE — Telephone Encounter (Signed)
Called and spoke to pt. Informed her of the results and recs per TP. Pt verbalized understanding and denied any further questions or concerns at this time.    Notes Recorded by Melvenia Needles, NP on 10/07/2015 at 2:06 PM No sign on PNA  Emphysema changes stable  Cont w/ ov recs Please contact office for sooner follow up if symptoms do not improve or worsen or seek emergency care

## 2015-10-27 ENCOUNTER — Encounter: Payer: Self-pay | Admitting: *Deleted

## 2015-11-15 ENCOUNTER — Ambulatory Visit (INDEPENDENT_AMBULATORY_CARE_PROVIDER_SITE_OTHER): Payer: Medicare Other | Admitting: Internal Medicine

## 2015-11-15 ENCOUNTER — Encounter: Payer: Self-pay | Admitting: Internal Medicine

## 2015-11-15 VITALS — BP 82/54 | HR 68 | Ht 66.0 in | Wt 108.2 lb

## 2015-11-15 DIAGNOSIS — R140 Abdominal distension (gaseous): Secondary | ICD-10-CM | POA: Diagnosis not present

## 2015-11-15 DIAGNOSIS — R1313 Dysphagia, pharyngeal phase: Secondary | ICD-10-CM | POA: Diagnosis not present

## 2015-11-15 DIAGNOSIS — R197 Diarrhea, unspecified: Secondary | ICD-10-CM | POA: Diagnosis not present

## 2015-11-15 NOTE — Patient Instructions (Signed)
Please follow up as needed for bloating, diarrhea.

## 2015-11-15 NOTE — Progress Notes (Signed)
HISTORY OF PRESENT ILLNESS:  Kristin [name] is a 70 y.o. female with past medical history as listed below who has been evaluated in this office for chronic bloating, chronic dysphagia, and varying bowel habits. She is undergone previous extensive evaluations (see office note from 07/09/2011). Last upper endoscopy March 2012 was normal. Swallowing evaluation with speech pathology unremarkable. Suboptimal cooperation. Colonoscopy July 2013 limited by poor prep. Subsequent barium enema suboptimal due to poor prep. Follow-up in 5 years with 2 day prep recommended. Last evaluated in this office 10/06/2015 with chief complaint of abdominal fullness and bloating as well as loose stools. Prescribe Xifaxan but never picked up. This follow-up appointment made. Patient tells me that she took Kaopectate which improved her bowel habits. She is back to her baseline of bloating and vague dysphagia. Her weight continues to fluctuate. She continues to smoke. No new complaints.  REVIEW OF SYSTEMS:  All non-GI ROS negative upon review today  Past Medical History  Diagnosis Date  . Vitiligo   . Degeneration of cervical intervertebral disc   . Cervicalgia   . Unspecified vitamin D deficiency   . Esophageal reflux   . Personal history of peptic ulcer disease   . Acute sinusitis, unspecified   . Chest pain, unspecified   . COPD (chronic obstructive pulmonary disease) ([place])   . Obstructive chronic bronchitis with exacerbation ([place])   . Unspecified hypothyroidism   . Unspecified essential hypertension   . Hyperlipidemia   . Diverticulosis of colon (without mention of hemorrhage)   . 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 IBS (irritable bowel syndrome)     Past Surgical History  Procedure Laterality Date  . Abdominal hysterectomy w/ partial vaginactomy    . Thyroidectomy, partial    . Tonsillectomy    . Colonoscopy  05/19/2012  . Esophagogastroduodenoscopy  01/03/2011    normal   . Breast biopsy Right      Social History Kristin [name]  reports that she has been smoking Cigarettes.  She has a 25 pack-year smoking history. She has never used smokeless tobacco. She reports that she does not drink alcohol or use illicit drugs.  family history includes Diabetes in her maternal grandfather, maternal grandmother, mother, paternal grandfather, and paternal grandmother; Heart attack in her maternal grandmother and mother; Hypertension in her maternal grandfather, maternal grandmother, mother, paternal grandfather, and paternal grandmother. There is no history of Colon cancer, Stomach cancer, Esophageal cancer, or Rectal cancer.  No Known Allergies     PHYSICAL EXAMINATION: Vital signs: BP 82/54 mmHg  Pulse 68  Ht 5\' 6"  (1.676 m)  Wt 108 lb 3.2 oz (49.079 kg)  BMI 17.47 kg/m2 General: Well-developed, well-nourished, no acute distress HEENT: Sclerae are anicteric, conjunctiva pink. Oral mucosa intact Lungs: Clear Heart: Regular Abdomen: soft, nontender, nondistended, no obvious ascites, no peritoneal signs, normal bowel sounds. No organomegaly. Extremities: No clubbing cyanosis or edema Psychiatric: alert and oriented x3. Cooperative Skin: Vitiligo on the forehead   ASSESSMENT:  #1. Problems with irregular bowel habits resolved after Kaopectate. #2. Chronic bloating. Ongoing. #3. Vague dysphagia. Ongoing. #4. COPD with ongoing tobacco abuse  PLAN:  #1. Stop smoking #2. Tentative plans for repeat colonoscopy with extensive preparation July 2018 #3. Return to the care of primary providers

## 2015-11-21 ENCOUNTER — Telehealth: Payer: Self-pay | Admitting: Adult Health

## 2015-11-21 MED ORDER — IPRATROPIUM-ALBUTEROL 18-103 MCG/ACT IN AERO: 2.0000 | INHALATION_SPRAY | Freq: Two times a day (BID) | RESPIRATORY_TRACT | Status: DC

## 2015-11-21 NOTE — Telephone Encounter (Signed)
Pt is aware of TP's recommendations. Needs a refill on combivent, this has been sent in. Nothing further was needed.

## 2015-11-21 NOTE — Telephone Encounter (Signed)
Okay , would stop symbicort  Is she better off synbicort May need evaluation if does not improve or worsens .  Cont on combvient and follow up for spirometry . As planned Please contact office for sooner follow up if symptoms do not improve or worsen or seek emergency care

## 2015-11-21 NOTE — Telephone Encounter (Signed)
Spoke with pt. States that for the past few weeks after using Symbicort her throat is closing up. Reports that it becomes hard to swallow anything including her own salvia. Denies having any difficult breathing when this happens. Advised her not to take anymore Symbicort until she hears back from [place].  **Symbicort has been added to pt's allergy list.**  TP - please advise.

## 2015-12-09 ENCOUNTER — Encounter: Payer: Self-pay | Admitting: Family

## 2015-12-09 DIAGNOSIS — I10 Essential (primary) hypertension: Secondary | ICD-10-CM

## 2015-12-09 MED ORDER — LOSARTAN POTASSIUM 50 MG PO TABS: 50.0000 mg | ORAL_TABLET | Freq: Every day | ORAL | Status: DC

## 2015-12-13 ENCOUNTER — Ambulatory Visit (INDEPENDENT_AMBULATORY_CARE_PROVIDER_SITE_OTHER): Payer: Medicare Other | Admitting: Family

## 2015-12-13 ENCOUNTER — Encounter: Payer: Self-pay | Admitting: Family

## 2015-12-13 VITALS — BP 112/68 | HR 100 | Temp 98.1°F | Resp 18 | Ht 66.0 in | Wt 101.8 lb

## 2015-12-13 DIAGNOSIS — J069 Acute upper respiratory infection, unspecified: Secondary | ICD-10-CM

## 2015-12-13 DIAGNOSIS — E782 Mixed hyperlipidemia: Secondary | ICD-10-CM

## 2015-12-13 DIAGNOSIS — I10 Essential (primary) hypertension: Secondary | ICD-10-CM | POA: Diagnosis not present

## 2015-12-13 MED ORDER — AZITHROMYCIN 250 MG PO TABS: ORAL_TABLET | ORAL | Status: DC

## 2015-12-13 MED ORDER — ROSUVASTATIN CALCIUM 5 MG PO TABS: 5.0000 mg | ORAL_TABLET | Freq: Every day | ORAL | Status: DC

## 2015-12-13 MED ORDER — HYDROCHLOROTHIAZIDE 25 MG PO TABS: 25.0000 mg | ORAL_TABLET | Freq: Every day | ORAL | Status: DC

## 2015-12-13 NOTE — Progress Notes (Signed)
Subjective:    Patient ID: Kristin [name], female    DOB: [birth year], 70 y.o.   MRN: [id number]  Chief Complaint  Patient presents with  . Sore Throat    sore throat, difficulty swallowing, left ear ache, has been going on off and on since november    HPI:  Kristin [name] is a 70 y.o. 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 and presents today for a follow up office.  Associated symptom of sore throat, left ear ache, minor congestion and some coughing has been going on since November including her difficulties swallowing. Denies fevers. Modifying factors include Robutussin and chloraseptic spray which have not helped very much. Course has slowly worsened over the past couple of weeks. Denies recent antibiotic use.  Allergies  Allergen Reactions  . Symbicort [Budesonide-Formoterol Fumarate] Other (See Comments)    Throat swelling     Current Outpatient Prescriptions on File Prior to Visit  Medication Sig Dispense Refill  . albuterol-ipratropium (COMBIVENT) 18-103 MCG/ACT inhaler Inhale 2 puffs into the lungs 2 (two) times daily. 1 Inhaler 5  . losartan (COZAAR) 50 MG tablet Take 1 tablet (50 mg total) by mouth daily. 90 tablet 1  . Nutritional Supplements (GLUCERNA ADVANCE SHAKE) LIQD Take 1 Bottle by mouth 2 (two) times daily. 60 Bottle 2  . rifaximin (XIFAXAN) 550 MG TABS tablet Take 1 tablet (550 mg total) by mouth 3 (three) times daily. 42 tablet 0  .  SYNTHROID 88 MCG tablet Take 1 tablet (88 mcg total) by mouth daily before breakfast. 90 tablet 1   No current facility-administered medications on file prior to visit.    Review of Systems  Constitutional: Negative for fever and chills.  HENT: Positive for ear pain and sore throat.   Respiratory: Positive for cough. Negative for chest tightness and shortness of breath.   Neurological: Negative for headaches.      Objective:    BP 112/68 mmHg  Pulse 100  Temp(Src) 98.1 F (36.7 C) (Oral)  Resp 18  Ht 5\' 6"  (1.676 m)  Wt 101 lb 12.8 oz (46.176 kg)  BMI 16.44 kg/m2  SpO2 96% Nursing note and vital signs reviewed.  Physical Exam  Constitutional: She is oriented to person, place, and time. She appears well-developed and well-nourished. No distress.  HENT:  Right Ear: Hearing, tympanic membrane, external ear and ear canal normal.  Left Ear: Hearing, tympanic membrane, external ear and ear canal normal.  Nose: Nose normal. Right sinus exhibits no maxillary sinus tenderness and no frontal sinus tenderness. Left sinus exhibits no maxillary sinus tenderness and no frontal sinus tenderness.  Mouth/Throat: Uvula is midline, oropharynx is clear and moist and mucous membranes are normal.  Neck: Neck supple.  Cardiovascular: Normal rate, regular rhythm, normal heart sounds and intact distal pulses.   Pulmonary/Chest: Effort normal and breath sounds normal.  Lymphadenopathy:    She has cervical adenopathy.  Neurological: She is alert and oriented to person, place, and time.  Skin: Skin is warm and dry.  Psychiatric: She has a normal mood and affect. Her behavior is normal. Judgment and thought content normal.  Assessment & Plan:   Problem List Items Addressed This Visit      Cardiovascular and Mediastinum   Essential hypertension - Primary   Relevant Medications   hydrochlorothiazide (HYDRODIURIL) 25 MG tablet   rosuvastatin (CRESTOR) 5 MG tablet     Respiratory   Upper  respiratory infection    Symptoms and exam consistent with upper respiratory infection. Start azithromycin. Continue over the counter medications as needed for symptom relief and supportive care. Continue to work with specialists regarding dysphagia. Follow up if symptoms worsen or fail to improve.       Relevant Medications   azithromycin (ZITHROMAX) 250 MG tablet     Other   HYPERLIPIDEMIA, MIXED   Relevant Medications   hydrochlorothiazide (HYDRODIURIL) 25 MG tablet   rosuvastatin (CRESTOR) 5 MG tablet

## 2015-12-13 NOTE — Patient Instructions (Signed)
Thank you for choosing [facility].  Summary/Instructions:  Your prescription(s) have been submitted to your pharmacy or been printed and provided for you. Please take as directed and contact our office if you believe you are having problem(s) with the medication(s) or have any questions.  If your symptoms worsen or fail to improve, please contact our office for further instruction, or in case of emergency go directly to the emergency room at the closest medical facility.   General Recommendations:    Please drink plenty of fluids.  Get plenty of rest   Sleep in humidified air  Use saline nasal sprays  Netti pot   OTC Medications:  Decongestants - helps relieve congestion   Flonase (generic fluticasone) or Nasacort (generic triamcinolone) - please make sure to use the "cross-over" technique at a 45 degree angle towards the opposite eye as opposed to straight up the nasal passageway.   Sudafed (generic pseudoephedrine - Note this is the one that is available behind the pharmacy counter); Products with phenylephrine (-PE) may also be used but is often not as effective as pseudoephedrine.   If you have HIGH BLOOD PRESSURE - Coricidin HBP; AVOID any product that is -D as this contains pseudoephedrine which may increase your blood pressure.  Afrin (oxymetazoline) every 6-8 hours for up to 3 days.   Allergies - helps relieve runny nose, itchy eyes and sneezing   Claritin (generic loratidine), Allegra (fexofenidine), or Zyrtec (generic cyrterizine) for runny nose. These medications should not cause drowsiness.  Note - Benadryl (generic diphenhydramine) may be used however may cause drowsiness  Cough -   Delsym or Robitussin (generic dextromethorphan)  Expectorants - helps loosen mucus to ease removal   Mucinex (generic guaifenesin) as directed on the package.  Headaches / General Aches   Tylenol (generic acetaminophen) - DO NOT EXCEED 3 grams (3,000 mg) in a 24  hour time period  Advil/Motrin (generic ibuprofen)   Sore Throat -   Salt water gargle   Chloraseptic (generic benzocaine) spray or lozenges / Sucrets (generic dyclonine)      

## 2015-12-13 NOTE — Assessment & Plan Note (Signed)
Symptoms and exam consistent with upper respiratory infection. Start azithromycin. Continue over the counter medications as needed for symptom relief and supportive care. Continue to work with specialists regarding dysphagia. Follow up if symptoms worsen or fail to improve.

## 2015-12-13 NOTE — Progress Notes (Signed)
Pre visit review using our clinic review tool, if applicable. No additional management support is needed unless otherwise documented below in the visit note. 

## 2016-01-04 ENCOUNTER — Other Ambulatory Visit: Payer: Self-pay | Admitting: Emergency Medicine

## 2016-01-04 DIAGNOSIS — R0600 Dyspnea, unspecified: Secondary | ICD-10-CM

## 2016-01-05 ENCOUNTER — Encounter: Payer: Self-pay | Admitting: Emergency Medicine

## 2016-01-05 ENCOUNTER — Other Ambulatory Visit: Payer: Medicare Other

## 2016-01-05 ENCOUNTER — Ambulatory Visit (INDEPENDENT_AMBULATORY_CARE_PROVIDER_SITE_OTHER): Payer: Medicare Other | Admitting: Emergency Medicine

## 2016-01-05 VITALS — BP 90/62 | HR 107 | Ht 66.0 in | Wt 99.0 lb

## 2016-01-05 DIAGNOSIS — R1310 Dysphagia, unspecified: Secondary | ICD-10-CM

## 2016-01-05 DIAGNOSIS — R0600 Dyspnea, unspecified: Secondary | ICD-10-CM | POA: Diagnosis not present

## 2016-01-05 LAB — PULMONARY FUNCTION TEST
FEF 25-75 Pre: 0.36 L/sec
FEF2575-%Pred-Pre: 18 %
FEV1-%PRED-PRE: 40 %
FEV1-PRE: 0.84 L
FEV1FVC-%Pred-Pre: 78 %
FEV6-%PRED-PRE: 52 %
FEV6-Pre: 1.36 L
FEV6FVC-%PRED-PRE: 102 %
FVC-%Pred-Pre: 51 %
FVC-Pre: 1.36 L
PRE FEV1/FVC RATIO: 61 %
Pre FEV6/FVC Ratio: 99 %

## 2016-01-05 MED ORDER — IPRATROPIUM-ALBUTEROL 20-100 MCG/ACT IN AERS: 2.0000 | INHALATION_SPRAY | Freq: Three times a day (TID) | RESPIRATORY_TRACT | Status: DC

## 2016-01-05 MED ORDER — PANTOPRAZOLE SODIUM 40 MG PO TBEC: 40.0000 mg | DELAYED_RELEASE_TABLET | Freq: Every day | ORAL | Status: DC

## 2016-01-05 NOTE — Assessment & Plan Note (Signed)
Spirometry today shows severe obstruction but is stable from 2014. We need to get her on scheduled medication. I asked her to change her Combivent to 3 times a day on a schedule. She will let us know she tolerates this from an upper airway standpoint. Discussed the importance of smoking cessation with her today. She knows that she needs to. She is not ready to set a quit date. We will continue to work on this actively.

## 2016-01-05 NOTE — Assessment & Plan Note (Signed)
She describes discomfort in her chest sometimes a burning and also seems to exacerbate her dysphagia. I believe that her GERD is under treated and that this may be at least a contributor to both her upper airway and her esophageal sensation of closing. I will start her on pantoprazole 40 mg daily, I would like to use the liquid formulation if possible although she may have to fail the capsule in order to qualify for this.

## 2016-01-05 NOTE — Progress Notes (Signed)
Subjective:    Patient ID: Kristin [name], female    DOB: Jan 08, 1946  MRN: [id number]   Brief patient profile:  72 yobf smoker with emphysema by ct referred by Dr Etter Sjogren 03/20/2013 to pulmonary clinic.   03/20/2013 1st pulmonary eval/ Wert on ACEi cc indolent onset doe worse in hot or cold x 6 years not really progressing assoc with variable cough/ choking/ dysphagia with neg GI w/u and sense of throat congestion but no excess mucus production even in am,  saba helps breathing some and avg use about twice daily. rec The key is to stop smoking completely before smoking completely stops you!  Stop accupril and start micardis 80 mg one half daily plus continue the hctz Please schedule a follow up office visit in 6 weeks, call sooner if needed with pfts    05/05/2013 f/u ov/Wert re copd GOLD III Chief Complaint  Patient presents with  . Followup with PFT    Pt states that her breathing has slightly improved since her last visit. No new co's today.    doe x grocery goes slow flat ok, try to nl pace or [place] then tires out quickly, rare perceived need for saba  >>no changes   09/2015 Follow up : COPD GOLD III , smoker  Pt presents for follow up . , Last seen in 2014  Using combivent Twice daily  . Says she feels she gets more sob with activities She is still smoking , but has cut back . We discussed cessation .  Flu utd. We discussed Prevnar vaccine -she is okay with this.  No significant cough or wheezing . Last chest x-ray 2014. She denies any hemoptysis, orthopnea, PND or leg swelling.Marland Kitchen  Has been having a lot of GI issues , has swallow issues , and wt loss.  Following with GI.   ROV 01/05/16 -- new patient visit for RB. 70 year old woman, current smoker (30 pack years), with a history of COPD, chronic rhinitis, chronic cough, GERD, irritable bowel syndrome. She has been followed by Dr Melvyn Novas, underwent pulmonary function testing in July 2014 that showed severe obstructive lung disease  with an FEV1 of 25 L, 34% predicted, a positive bronchodilator response. She was started on Symbicort in December 2016 but had stopped this medication due to upper airway irritation and the feeling that her "throat was closing". It has been added to her allergy list. She was started instead on Combivent which she takes prn. She underwent spirometry today that I have personally reviewed. This showed no change in her FEV1, currently 0.84 L or 40% predicted. She describes UA irritation, occasional VC closure, dysphagia that can prevent swallowing. Being evaluated by Dr Henrene Pastor - no evidence of obstruction. She has allergies, not on meds and would like to avoid if possible. She feels that her inability to eat not outweighs her other problems.       Past Medical History  Diagnosis Date  . Vitiligo   . Degeneration of cervical intervertebral disc   . Cervicalgia   . Unspecified vitamin D deficiency   . Esophageal reflux   . Personal history of peptic ulcer disease   . Acute sinusitis, unspecified   . Chest pain, unspecified   . COPD (chronic obstructive pulmonary disease) ([place])   . Obstructive chronic bronchitis with exacerbation ([place])   . Unspecified hypothyroidism   . Unspecified essential hypertension   . Hyperlipidemia   . Diverticulosis of colon (without mention of hemorrhage)   . 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  IBS (irritable bowel syndrome)      Family History  Problem Relation Age of Onset  . Diabetes Mother     everyone on both sides  . Hypertension Mother     everyone on both sides  . Heart attack Mother   . Colon cancer Neg Hx   . Stomach cancer Neg Hx   . Esophageal cancer Neg Hx   . Rectal cancer Neg Hx   . Heart attack Maternal Grandmother   . Diabetes Maternal Grandmother   . Hypertension Maternal Grandmother   . Diabetes Maternal Grandfather   . Hypertension Maternal Grandfather   . Diabetes Paternal Grandmother   . Hypertension Paternal Grandmother   . Diabetes  Paternal Grandfather   . Hypertension Paternal Grandfather      Social History   Social History  . Marital Status: Divorced    Spouse Name: N/A  . Number of Children: 3  . Years of Education: 20+   Occupational History  . Retired    Social History Main Topics  . Smoking status: Current Some Day Smoker -- 0.50 packs/day for 50 years    Types: Cigarettes  . Smokeless tobacco: Never Used     Comment: 3 cigarettes a day  . Alcohol Use: No  . Drug Use: No  . Sexual Activity: Not on file   Other Topics Concern  . Not on file   Social History Narrative   She lives alone.  She moved to [place] in 1998.  She has three grown children (2 sons, 1 daughter).  One son lives in [place].   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         Allergies  Allergen Reactions  . Symbicort [Budesonide-Formoterol Fumarate] Other (See Comments)    Throat swelling     Outpatient Prescriptions Prior to Visit  Medication Sig Dispense Refill  . albuterol-ipratropium (COMBIVENT) 18-103 MCG/ACT inhaler Inhale 2 puffs into the lungs 2 (two) times daily. 1 Inhaler 5  . hydrochlorothiazide (HYDRODIURIL) 25 MG tablet Take 1 tablet (25 mg total) by mouth daily. 90 tablet 1  . losartan (COZAAR) 50 MG tablet Take 1 tablet (50 mg total) by mouth daily. 90 tablet 1  . rosuvastatin (CRESTOR) 5 MG tablet Take 1 tablet (5 mg total) by mouth daily. 90 tablet 1  . SYNTHROID 88 MCG tablet Take 1 tablet (88 mcg total) by mouth daily before breakfast. 90 tablet 1  . azithromycin (ZITHROMAX) 250 MG tablet Take 2 tablets by mouth for 1 day and then 1 tablet by mouth daily for 4 days. 6 tablet 0  . Nutritional Supplements (GLUCERNA ADVANCE SHAKE) LIQD Take 1 Bottle by mouth 2 (two) times daily. (Patient not taking: Reported on 01/05/2016) 60 Bottle 2  . rifaximin (XIFAXAN) 550 MG TABS tablet Take  1 tablet (550 mg total) by mouth 3 (three) times daily. 42 tablet 0   No facility-administered medications prior to visit.         Objective:   Filed Vitals:   01/05/16 0942  BP: 90/62  Pulse: 107  Height: 5\' 6"  (1.676 m)  Weight: 99 lb (44.906 kg)  SpO2: 96%   Gen: Pleasant, thin woman, in no distress,  normal affect  ENT: No lesions,  mouth clear,  oropharynx clear, no postnasal drip  Neck: No JVD, no TMG, no carotid bruits  Lungs: No use of accessory  muscles, clear without rales or rhonchi  Cardiovascular: RRR, heart sounds normal, no murmur or gallops, no peripheral edema  Musculoskeletal: No deformities, no cyanosis or clubbing  Neuro: alert, non focal  Skin: Warm, some tightness that is suggestive of possible scleroderma.     Assessment & Plan:  GERD She describes discomfort in her chest sometimes a burning and also seems to exacerbate her dysphagia. I believe that her GERD is under treated and that this may be at least a contributor to both her upper airway and her esophageal sensation of closing. I will start her on pantoprazole 40 mg daily, I would like to use the liquid formulation if possible although she may have to fail the capsule in order to qualify for this.  Allergic rhinitis She would like to defer medications for this at this time  COPD GOLD III Spirometry today shows severe obstruction but is stable from 2014. We need to get her on scheduled medication. I asked her to change her Combivent to 3 times a day on a schedule. She will let us know she tolerates this from an upper airway standpoint. Discussed the importance of smoking cessation with her today. She knows that she needs to. She is not ready to set a quit date. We will continue to work on this actively.  DYSPHAGIA I suspect that a large component of this is due to poorly controlled GERD based on her description but I would like to screen her for possible scleroderma. We will send blood work  today.

## 2016-01-05 NOTE — Telephone Encounter (Signed)
rx sent to pharm  Email sent to pt informing her

## 2016-01-05 NOTE — Assessment & Plan Note (Signed)
She would like to defer medications for this at this time

## 2016-01-05 NOTE — Assessment & Plan Note (Signed)
I suspect that a large component of this is due to poorly controlled GERD based on her description but I would like to screen her for possible scleroderma. We will send blood work today.

## 2016-01-05 NOTE — Progress Notes (Signed)
Spirometry done today. 

## 2016-01-05 NOTE — Patient Instructions (Addendum)
Please start taking your combivent 2 puffs 3 times a day on a schedule (not just when you are having trouble).  Follow with Dr Henrene Pastor regarding your swallowing.  Please start pantoprazole 40mg  daily.  Follow with Dr Lamonte Sakai in 3 months or sooner if you have any problems.

## 2016-01-06 ENCOUNTER — Telehealth: Payer: Self-pay | Admitting: Emergency Medicine

## 2016-01-06 ENCOUNTER — Encounter: Payer: Self-pay | Admitting: Emergency Medicine

## 2016-01-06 LAB — ANTI-SCLERODERMA ANTIBODY: Scleroderma (Scl-70) (ENA) Antibody, IgG: <1

## 2016-01-06 NOTE — Telephone Encounter (Signed)
RB please advise. Thanks.  

## 2016-01-06 NOTE — Telephone Encounter (Signed)
Msg was not needed as RX was already called in.  Closing encounter.

## 2016-01-10 ENCOUNTER — Encounter: Payer: Self-pay | Admitting: Emergency Medicine

## 2016-01-11 ENCOUNTER — Encounter: Payer: Self-pay | Admitting: Emergency Medicine

## 2016-01-11 ENCOUNTER — Encounter: Payer: Self-pay | Admitting: Family

## 2016-01-11 DIAGNOSIS — E782 Mixed hyperlipidemia: Secondary | ICD-10-CM

## 2016-01-11 MED ORDER — PANTOPRAZOLE SODIUM 40 MG PO PACK: 40.0000 mg | PACK | Freq: Every day | ORAL | Status: DC

## 2016-01-12 MED ORDER — ROSUVASTATIN CALCIUM 5 MG PO TABS: 5.0000 mg | ORAL_TABLET | Freq: Every day | ORAL | Status: DC

## 2016-01-12 NOTE — Telephone Encounter (Signed)
Refill sent to [facility].../lmb 

## 2016-01-18 ENCOUNTER — Other Ambulatory Visit: Payer: Self-pay

## 2016-01-18 DIAGNOSIS — Z1231 Encounter for screening mammogram for malignant neoplasm of breast: Secondary | ICD-10-CM

## 2016-01-20 ENCOUNTER — Encounter: Payer: Self-pay | Admitting: Family

## 2016-01-20 ENCOUNTER — Other Ambulatory Visit: Payer: Self-pay | Admitting: Family

## 2016-01-31 ENCOUNTER — Ambulatory Visit: Payer: Medicare Other | Admitting: Neurology

## 2016-02-01 ENCOUNTER — Ambulatory Visit: Payer: Medicare Other | Admitting: Neurology

## 2016-02-14 ENCOUNTER — Other Ambulatory Visit (INDEPENDENT_AMBULATORY_CARE_PROVIDER_SITE_OTHER): Payer: Medicare Other

## 2016-02-14 ENCOUNTER — Encounter: Payer: Self-pay | Admitting: Family

## 2016-02-14 ENCOUNTER — Ambulatory Visit (INDEPENDENT_AMBULATORY_CARE_PROVIDER_SITE_OTHER): Payer: Medicare Other | Admitting: Family

## 2016-02-14 VITALS — BP 104/70 | HR 98 | Temp 97.8°F | Resp 14 | Ht 66.0 in | Wt 99.0 lb

## 2016-02-14 DIAGNOSIS — E039 Hypothyroidism, unspecified: Secondary | ICD-10-CM

## 2016-02-14 DIAGNOSIS — Z7289 Other problems related to lifestyle: Secondary | ICD-10-CM

## 2016-02-14 DIAGNOSIS — E119 Type 2 diabetes mellitus without complications: Secondary | ICD-10-CM

## 2016-02-14 LAB — TSH: TSH: 0.09 u[IU]/mL — ABNORMAL LOW (ref 0.35–4.50)

## 2016-02-14 LAB — HEMOGLOBIN A1C: Hgb A1c MFr Bld: 6.3 % (ref 4.6–6.5)

## 2016-02-14 MED ORDER — LEVOTHYROXINE SODIUM 75 MCG PO TABS: 75.0000 ug | ORAL_TABLET | Freq: Every day | ORAL | Status: DC

## 2016-02-14 MED ORDER — SYNTHROID 75 MCG PO TABS: 75.0000 ug | ORAL_TABLET | Freq: Every day | ORAL | Status: DC

## 2016-02-14 NOTE — Progress Notes (Signed)
Subjective:    Patient ID: Kristin [name], female    DOB: [birth year], 70 y.o.   MRN: [id number]  Chief Complaint  Patient presents with  . CPE    wants tsh and A1c check    HPI:  Kristin [name] is a 70 y.o. 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 and presents today For a follow-up office visit.  1.) Hypothyroidism - currently maintained on Synthroid. Reports taking the medication as prescribed without adverse side effects. Denies fatigue, temperature intolerance, or changes to skin hair and nails.  Lab Results  Component Value Date   TSH 0.09* 02/14/2016   2.) Type 2 diabetes -  currently managed with lifestyle with no adverse side effects. Does not currently check blood sugars at home. No symptoms of end organ damage. Denies polydipsia, polyphagia, or polyuria.  Lab Results  Component Value Date   HGBA1C 6.3 02/14/2016    3.) Health maintenance -  Health Maintenance  Topic Date Due  . Hepatitis C Screening  07-Nov-1945  . FOOT EXAM  02/24/1958  . TETANUS/TDAP  10/29/2013  . HEMOGLOBIN A1C  07/10/2015  . INFLUENZA VACCINE  05/29/2016  . OPHTHALMOLOGY EXAM  07/10/2016  . PNA vac Low Risk Adult (2 of 2 - PPSV23) 10/06/2016  . MAMMOGRAM  02/17/2017  . COLONOSCOPY  05/19/2022  . DEXA SCAN  Completed  . ZOSTAVAX  Completed   Immunization History  Administered Date(s) Administered  . Influenza Whole 07/21/2008, 08/19/2009, 08/30/2010, 07/29/2012  .  Influenza,inj,Quad PF,36+ Mos 07/08/2015  . Pneumococcal Conjugate-13 10/07/2015  . Pneumococcal Polysaccharide-23 10/30/2003  . Td 10/30/2003  . Zoster 05/26/2008   Allergies  Allergen Reactions  . Symbicort [Budesonide-Formoterol Fumarate] Other (See Comments)    Throat swelling     Outpatient Prescriptions Prior to Visit  Medication Sig Dispense Refill  . hydrochlorothiazide (HYDRODIURIL) 25 MG tablet Take 1 tablet (25 mg total) by mouth daily. 90 tablet 1  . Ipratropium-Albuterol (COMBIVENT) 20-100 MCG/ACT AERS respimat Inhale 2 puffs into the lungs 3 (three) times daily. 1 Inhaler 5  . losartan (COZAAR) 50 MG tablet Take 1 tablet (50 mg total) by mouth daily. 90 tablet 1  . pantoprazole sodium (PROTONIX) 40 mg/20 mL PACK Take 20 mLs (40 mg total) by mouth daily. 600 mL 5  . rosuvastatin (CRESTOR) 5 MG tablet Take 1 tablet (5 mg total) by mouth daily. 90 tablet 1  . SYNTHROID 88 MCG tablet Take 1 tablet (88 mcg total) by mouth daily before breakfast. 90 tablet 1  . albuterol-ipratropium (COMBIVENT) 18-103 MCG/ACT inhaler Inhale 2 puffs into the lungs 2 (two) times daily. 1 Inhaler 5  . SYNTHROID 88 MCG tablet Take 1 tablet (88 mcg total) by mouth daily before breakfast. Yearly physical w/labs due April must see MD for refills 30 tablet 0  . SYNTHROID 88 MCG tablet TAKE ONE TABLET BY MOUTH DAILY BEFORE BREAKFAST 30 tablet 0   No facility-administered medications prior to visit.     Past Medical History  Diagnosis Date  . Vitiligo   . Degeneration of cervical intervertebral disc   . Cervicalgia   . Unspecified vitamin D deficiency   .  Esophageal reflux   . Personal history of peptic ulcer disease   . Acute sinusitis, unspecified   . Chest pain, unspecified   . COPD (chronic obstructive pulmonary disease) ([place])   . Obstructive chronic bronchitis with exacerbation ([place])   . Unspecified hypothyroidism   . Unspecified essential hypertension   . Hyperlipidemia   .  Diverticulosis of colon (without mention of hemorrhage)   . 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 IBS (irritable bowel syndrome)      Past Surgical History  Procedure Laterality Date  . Abdominal hysterectomy w/ partial vaginactomy    . Thyroidectomy, partial    . Tonsillectomy    . Colonoscopy  05/19/2012  . Esophagogastroduodenoscopy  01/03/2011    normal   . Breast biopsy Right      Family History  Problem Relation Age of Onset  . Diabetes Mother     everyone on both sides  . Hypertension Mother     everyone on both sides  . Heart attack Mother   . Colon cancer Neg Hx   . Stomach cancer Neg Hx   . Esophageal cancer Neg Hx   . Rectal cancer Neg Hx   . Heart attack Maternal Grandmother   . Diabetes Maternal Grandmother   . Hypertension Maternal Grandmother   . Diabetes Maternal Grandfather   . Hypertension Maternal Grandfather   . Diabetes Paternal Grandmother   . Hypertension Paternal Grandmother   . Diabetes Paternal Grandfather   . Hypertension Paternal Grandfather      Social History   Social History  . Marital Status: Divorced    Spouse Name: N/A  . Number of Children: 3  . Years of Education: 20+   Occupational History  . Retired    Social History Main Topics  . Smoking status: Current Some Day Smoker -- 0.50 packs/day for 50 years    Types: Cigarettes  . Smokeless tobacco: Never Used     Comment: 3 cigarettes a day  . Alcohol Use: No  . Drug Use: No  . Sexual Activity: Not on file   Other Topics Concern  . Not on file   Social History Narrative   She lives alone.  She moved to [place] in 1998.  She has three grown children (2 sons, 1 daughter).  One son lives in [place].   Retired Retail banker   Highest level of education:  PhD in [place], [place] in community agency counselling   Denies any religious beliefs effecting health care.         Review of Systems  Eyes:       Denies changes in vision  Endocrine: Negative for polydipsia,  polyphagia and polyuria.  Neurological: Negative for numbness.      Objective:    BP 104/70 mmHg  Pulse 98  Temp(Src) 97.8 F (36.6 C) (Oral)  Resp 14  Ht 5\' 6"  (1.676 m)  Wt 99 lb (44.906 kg)  BMI 15.99 kg/m2  SpO2 96% Nursing note and vital signs reviewed.  Physical Exam  Constitutional: She is oriented to person, place, and time. She appears well-developed and well-nourished. No distress.  Appears thin, dressed appropriately for situation, and answers questions appropriately.  Cardiovascular: Normal rate, regular rhythm, normal heart sounds and intact distal pulses.  Exam reveals no gallop and no friction rub.   No murmur heard. Pulmonary/Chest: Effort normal and breath sounds normal. She has no wheezes. She has no rales. She exhibits no tenderness.  Musculoskeletal:  Diabetic Foot Exam - Simple  Simple Foot Form  Diabetic Foot exam was performed with the following findings:  Yes  02/14/2016  9:30 AM  Visual Inspection  No deformities, no ulcerations, no other skin breakdown bilaterally:  Yes  Sensation Testing  Intact to touch and monofilament testing bilaterally:  Yes  Pulse Check  Posterior Tibialis and Dorsalis pulse intact bilaterally:  Yes  Neurological: She is alert and oriented to person, place, and time.  Skin: Skin is warm and dry.  Psychiatric: She has a normal mood and affect. Her behavior is normal. Judgment and thought content normal.       Assessment & Plan:   Problem List Items Addressed This Visit      Endocrine   Hypothyroidism - Primary    Hypothyroidism appears stable with current regimen of Synthroid at 88 g. No adverse side effects. Obtain TSH, T3, T4. Continue current dosage of Synthroid pending TSH results.      Relevant Medications   SYNTHROID 75 MCG tablet   Other Relevant Orders   TSH (Completed)   T3   T4   Type 2 diabetes mellitus, controlled ([place])    Diabetes appears stable with lifestyle management with no adverse or  significant end organ damage symptoms. Diabetic foot exam completed today with no abnormalities. Obtain A1c to check current status. Maintained on losartan and rosuvastatin for CAD risk reduction. Continue lifestyle management pending A1c results.      Relevant Orders   Hemoglobin A1c (Completed)    Other Visit Diagnoses    Other problems related to lifestyle        Relevant Orders    Hepatitis C antibody       I have discontinued Ms. Petrakis's SYNTHROID, albuterol-ipratropium, SYNTHROID, and SYNTHROID. I have also changed her levothyroxine to SYNTHROID. Additionally, I am having her maintain her losartan, hydrochlorothiazide, Ipratropium-Albuterol, pantoprazole sodium, and rosuvastatin.   Follow-up: Return in about 3 months (around 05/15/2016) for Thyroid.  Mauricio Po, FNP

## 2016-02-14 NOTE — Progress Notes (Signed)
Pre visit review using our clinic review tool, if applicable. No additional management support is needed unless otherwise documented below in the visit note. 

## 2016-02-14 NOTE — Assessment & Plan Note (Signed)
Diabetes appears stable with lifestyle management with no adverse or significant end organ damage symptoms. Diabetic foot exam completed today with no abnormalities. Obtain A1c to check current status. Maintained on losartan and rosuvastatin for CAD risk reduction. Continue lifestyle management pending A1c results.

## 2016-02-14 NOTE — Assessment & Plan Note (Signed)
Hypothyroidism appears stable with current regimen of Synthroid at 88 g. No adverse side effects. Obtain TSH, T3, T4. Continue current dosage of Synthroid pending TSH results.

## 2016-02-14 NOTE — Patient Instructions (Addendum)
Thank you for choosing [facility].  Summary/Instructions:  Please continue to take your medications as prescribed.   Your prescription(s) have been submitted to your pharmacy or been printed and provided for you. Please take as directed and contact our office if you believe you are having problem(s) with the medication(s) or have any questions.  Please stop by the lab on the basement level of the building for your blood work. Your results will be released to MyChart (or called to you) after review, usually within 72 hours after test completion. If any changes need to be made, you will be notified at that same time.  If your symptoms worsen or fail to improve, please contact our office for further instruction, or in case of emergency go directly to the emergency room at the closest medical facility.     

## 2016-02-15 ENCOUNTER — Encounter: Payer: Self-pay | Admitting: Family

## 2016-02-15 LAB — T4: T4, Total: 14.9 ug/dL — ABNORMAL HIGH (ref 4.5–12.0)

## 2016-02-15 LAB — T3: T3, Total: 108 ng/dL (ref 76–181)

## 2016-02-15 LAB — HEPATITIS C ANTIBODY: HCV Ab: NEGATIVE

## 2016-02-20 ENCOUNTER — Ambulatory Visit
Admission: RE | Admit: 2016-02-20 | Discharge: 2016-02-20 | Disposition: A | Discharge status: Home / Self Care | Payer: Medicare Other | Source: Ambulatory Visit

## 2016-02-20 DIAGNOSIS — Z1231 Encounter for screening mammogram for malignant neoplasm of breast: Secondary | ICD-10-CM

## 2016-03-15 ENCOUNTER — Encounter: Payer: Self-pay | Admitting: Family

## 2016-03-15 DIAGNOSIS — R4702 Dysphasia: Secondary | ICD-10-CM

## 2016-04-03 DIAGNOSIS — R1310 Dysphagia, unspecified: Secondary | ICD-10-CM | POA: Diagnosis not present

## 2016-04-03 DIAGNOSIS — Z888 Allergy status to other drugs, medicaments and biological substances status: Secondary | ICD-10-CM | POA: Diagnosis not present

## 2016-04-03 DIAGNOSIS — F17200 Nicotine dependence, unspecified, uncomplicated: Secondary | ICD-10-CM | POA: Diagnosis not present

## 2016-04-17 ENCOUNTER — Encounter: Payer: Self-pay | Admitting: Emergency Medicine

## 2016-04-17 ENCOUNTER — Ambulatory Visit (INDEPENDENT_AMBULATORY_CARE_PROVIDER_SITE_OTHER): Payer: Commercial Managed Care - HMO | Admitting: Emergency Medicine

## 2016-04-17 VITALS — BP 90/58 | HR 97 | Ht 66.25 in | Wt 99.0 lb

## 2016-04-17 DIAGNOSIS — R1319 Other dysphagia: Secondary | ICD-10-CM | POA: Diagnosis not present

## 2016-04-17 DIAGNOSIS — J449 Chronic obstructive pulmonary disease, unspecified: Secondary | ICD-10-CM

## 2016-04-17 MED ORDER — IPRATROPIUM-ALBUTEROL 20-100 MCG/ACT IN AERS: 2.0000 | INHALATION_SPRAY | Freq: Three times a day (TID) | RESPIRATORY_TRACT | Status: DC

## 2016-04-17 NOTE — Assessment & Plan Note (Signed)
Please continue Combivent 2 puffs 3 times a day We will need to work hard on stopping smoking completely Follow with Dr Lamonte Sakai in 6 months or sooner if you have any problems

## 2016-04-17 NOTE — Assessment & Plan Note (Signed)
Workup still ongoing, scleroderma Ab negative

## 2016-04-17 NOTE — Progress Notes (Signed)
Subjective:    Patient ID: Kristin [name], female    DOB: December 30, 1945  MRN: [id number]   Brief patient profile:  28 yobf smoker with emphysema by ct referred by Dr Etter Sjogren 03/20/2013 to pulmonary clinic.   03/20/2013 1st pulmonary eval/ Wert on ACEi cc indolent onset doe worse in hot or cold x 6 years not really progressing assoc with variable cough/ choking/ dysphagia with neg GI w/u and sense of throat congestion but no excess mucus production even in am,  saba helps breathing some and avg use about twice daily. rec The key is to stop smoking completely before smoking completely stops you!  Stop accupril and start micardis 80 mg one half daily plus continue the hctz Please schedule a follow up office visit in 6 weeks, call sooner if needed with pfts    05/05/2013 f/u ov/Wert re copd GOLD III Chief Complaint  Patient presents with  . Followup with PFT    Pt states that her breathing has slightly improved since her last visit. No new co's today.    doe x grocery goes slow flat ok, try to nl pace or [place] then tires out quickly, rare perceived need for saba  >>no changes   09/2015 Follow up : COPD GOLD III , smoker  Pt presents for follow up . , Last seen in 2014  Using combivent Twice daily  . Says she feels she gets more sob with activities She is still smoking , but has cut back . We discussed cessation .  Flu utd. We discussed Prevnar vaccine -she is okay with this.  No significant cough or wheezing . Last chest x-ray 2014. She denies any hemoptysis, orthopnea, PND or leg swelling.Marland Kitchen  Has been having a lot of GI issues , has swallow issues , and wt loss.  Following with GI.   ROV 01/05/16 -- new patient visit for RB. 70 year old woman, current smoker (30 pack years), with a history of COPD, chronic rhinitis, chronic cough, GERD, irritable bowel syndrome. She has been followed by Dr Melvyn Novas, underwent pulmonary function testing in July 2014 that showed severe obstructive lung disease  with an FEV1 of 25 L, 34% predicted, a positive bronchodilator response. She was started on Symbicort in December 2016 but had stopped this medication due to upper airway irritation and the feeling that her "throat was closing". It has been added to her allergy list. She was started instead on Combivent which she takes prn. She underwent spirometry today that I have personally reviewed. This showed no change in her FEV1, currently 0.84 L or 40% predicted. She describes UA irritation, occasional VC closure, dysphagia that can prevent swallowing. Being evaluated by Dr Henrene Pastor - no evidence of obstruction. She has allergies, not on meds and would like to avoid if possible. She feels that her inability to eat not outweighs her other problems.   ROV 04/17/16 -- follow-up visit for COPD, continued tobacco use, severe obstruction on spirometry. Also with some symptoms that are reminiscent of upper airway irritation and possible vocal cord dysfunction. She also has symptoms consistent with GERD and possible dysphagia. I started pantoprazole 40 mg last visit. I also changed her Combivent to 3 times a day scheduled. Her scleroderma antibody was negative. She feels that the scheduled combivent has been beneficial. She still has marginal PO - having difficulty w reflux. She didn't start pantoprazole > unable to crush, couldn';t get the liquid version.  Smokes about 1-2 cig a day.     Past Medical  History  Diagnosis Date  . Vitiligo   . Degeneration of cervical intervertebral disc   . Cervicalgia   . Unspecified vitamin D deficiency   . Esophageal reflux   . Personal history of peptic ulcer disease   . Acute sinusitis, unspecified   . Chest pain, unspecified   . COPD (chronic obstructive pulmonary disease) ([place])   . Obstructive chronic bronchitis with exacerbation ([place])   . Unspecified hypothyroidism   . Unspecified essential hypertension   . Hyperlipidemia   . Diverticulosis of colon (without mention of  hemorrhage)   . 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 IBS (irritable bowel syndrome)      Family History  Problem Relation Age of Onset  . Diabetes Mother     everyone on both sides  . Hypertension Mother     everyone on both sides  . Heart attack Mother   . Colon cancer Neg Hx   . Stomach cancer Neg Hx   . Esophageal cancer Neg Hx   . Rectal cancer Neg Hx   . Heart attack Maternal Grandmother   . Diabetes Maternal Grandmother   . Hypertension Maternal Grandmother   . Diabetes Maternal Grandfather   . Hypertension Maternal Grandfather   . Diabetes Paternal Grandmother   . Hypertension Paternal Grandmother   . Diabetes Paternal Grandfather   . Hypertension Paternal Grandfather      Social History   Social History  . Marital Status: Divorced    Spouse Name: N/A  . Number of Children: 3  . Years of Education: 20+   Occupational History  . Retired    Social History Main Topics  . Smoking status: Current Some Day Smoker -- 0.50 packs/day for 50 years    Types: Cigarettes  . Smokeless tobacco: Never Used     Comment: 3 cigarettes a day  . Alcohol Use: No  . Drug Use: No  . Sexual Activity: Not on file   Other Topics Concern  . Not on file   Social History Narrative   She lives alone.  She moved to [place] in 1998.  She has three grown children (2 sons, 1 daughter).  One son lives in [place].   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         Allergies  Allergen Reactions  . Symbicort [Budesonide-Formoterol Fumarate] Other (See Comments)    Throat swelling     Outpatient Prescriptions Prior to Visit  Medication Sig Dispense Refill  . hydrochlorothiazide (HYDRODIURIL) 25 MG tablet Take 1 tablet (25 mg total) by mouth daily. 90 tablet 1  . Ipratropium-Albuterol (COMBIVENT) 20-100 MCG/ACT AERS respimat Inhale 2 puffs into the lungs 3 (three)  times daily. 1 Inhaler 5  . losartan (COZAAR) 50 MG tablet Take 1 tablet (50 mg total) by mouth daily. 90 tablet 1  . rosuvastatin (CRESTOR) 5 MG tablet Take 1 tablet (5 mg total) by mouth daily. 90 tablet 1  . SYNTHROID 75 MCG tablet Take 1 tablet (75 mcg total) by mouth daily before breakfast. 30 tablet 2  . pantoprazole sodium (PROTONIX) 40 mg/20 mL PACK Take 20 mLs (40 mg total) by mouth daily. 600 mL 5   No facility-administered medications prior to visit.         Objective:   Filed Vitals:   04/17/16 0923  BP: 90/58  Pulse: 97  Height: 5' 6.25" (1.683  m)  Weight: 99 lb (44.906 kg)  SpO2: 96%   Gen: Pleasant, thin woman, in no distress,  normal affect  ENT: No lesions,  mouth clear,  oropharynx clear, no postnasal drip  Neck: No JVD, no TMG, no carotid bruits  Lungs: No use of accessory muscles, clear without rales or rhonchi  Cardiovascular: RRR, heart sounds normal, no murmur or gallops, no peripheral edema  Musculoskeletal: No deformities, no cyanosis or clubbing  Neuro: alert, non focal  Skin: Warm, some tightness that is suggestive of possible scleroderma.     Assessment & Plan:  COPD GOLD III Please continue Combivent 2 puffs 3 times a day We will need to work hard on stopping smoking completely Follow with Dr Lamonte Sakai in 6 months or sooner if you have any problems  DYSPHAGIA Workup still ongoing, scleroderma Ab negative   Baltazar Apo, MD, PhD 04/17/2016, 9:48 AM [facility] [phone] or if no answer [phone]

## 2016-04-17 NOTE — Patient Instructions (Signed)
Please continue Combivent 2 puffs 3 times a day We will need to work hard on stopping smoking completely Follow with Dr Lamonte Sakai in 6 months or sooner if you have any problems

## 2016-04-17 NOTE — Addendum Note (Signed)
Addended by: Desmond Dike C on: 04/17/2016 09:52 AM   Modules accepted: Orders, SmartSet

## 2016-05-20 ENCOUNTER — Encounter: Payer: Self-pay | Admitting: Family

## 2016-05-21 ENCOUNTER — Telehealth: Payer: Self-pay | Admitting: Emergency Medicine

## 2016-05-21 DIAGNOSIS — E039 Hypothyroidism, unspecified: Secondary | ICD-10-CM

## 2016-05-21 MED ORDER — LEVOTHYROXINE SODIUM 88 MCG PO TABS: 88.0000 ug | ORAL_TABLET | Freq: Every day | ORAL | 1 refills | Status: DC

## 2016-05-21 NOTE — Telephone Encounter (Signed)
Medication sent to pharmacy. Please have her discontinue the 75 mcg. Follow up TSH in 6 weeks.

## 2016-05-21 NOTE — Telephone Encounter (Signed)
Pt called and wants to know if the dosage of her medicine SYNTHROID 75 MCG tablet can be switched back to 88 MCG. The 75 MCG dosage isnt working. Please follow up thanks.

## 2016-05-21 NOTE — Telephone Encounter (Signed)
Pt aware.

## 2016-05-21 NOTE — Telephone Encounter (Signed)
Please advise 

## 2016-05-22 ENCOUNTER — Telehealth: Payer: Self-pay | Admitting: Emergency Medicine

## 2016-05-22 MED ORDER — SYNTHROID 88 MCG PO TABS: 88.0000 ug | ORAL_TABLET | Freq: Every day | ORAL | 1 refills | Status: DC

## 2016-05-22 NOTE — Telephone Encounter (Signed)
Rec'd call pt states she need BRAND SYNTHROID can't take the generic. Inform pt will resend w/DAW...Kristin [name]

## 2016-05-22 NOTE — Telephone Encounter (Signed)
Pt wants to know if she can have the brand name prescription of levothyroxine (SYNTHROID) 88 MCG tablet. Please follow up thanks.

## 2016-06-25 ENCOUNTER — Other Ambulatory Visit (INDEPENDENT_AMBULATORY_CARE_PROVIDER_SITE_OTHER): Payer: Commercial Managed Care - HMO

## 2016-06-25 ENCOUNTER — Other Ambulatory Visit: Payer: Self-pay

## 2016-06-25 DIAGNOSIS — E039 Hypothyroidism, unspecified: Secondary | ICD-10-CM

## 2016-06-25 LAB — TSH: TSH: 0.62 u[IU]/mL (ref 0.35–4.50)

## 2016-06-25 MED ORDER — SYNTHROID 88 MCG PO TABS: 88.0000 ug | ORAL_TABLET | Freq: Every day | ORAL | 1 refills | Status: DC

## 2016-07-04 ENCOUNTER — Other Ambulatory Visit: Payer: Self-pay | Admitting: Family

## 2016-07-04 DIAGNOSIS — I10 Essential (primary) hypertension: Secondary | ICD-10-CM

## 2016-07-18 ENCOUNTER — Telehealth: Payer: Self-pay | Admitting: Internal Medicine

## 2016-07-18 NOTE — Telephone Encounter (Signed)
Spoke with pt and she states she will see her PCP because she now had to have a referral to see Dr. Henrene Pastor, pt has [employer]. Pt states she will call back once referral in place.

## 2016-07-19 ENCOUNTER — Telehealth: Payer: Self-pay

## 2016-07-19 MED ORDER — UMECLIDINIUM-VILANTEROL 62.5-25 MCG/INH IN AEPB: 1.0000 | INHALATION_SPRAY | Freq: Every day | RESPIRATORY_TRACT | 11 refills | Status: DC

## 2016-07-19 MED ORDER — IPRATROPIUM-ALBUTEROL 20-100 MCG/ACT IN AERS: 2.0000 | INHALATION_SPRAY | Freq: Three times a day (TID) | RESPIRATORY_TRACT | 5 refills | Status: DC

## 2016-07-19 NOTE — Telephone Encounter (Signed)
Pt calling with question regarding her Combivent inhaler. Pt states since her dose was increased from BID to TID she has been running out of her inhaler before then end of the month and insurance will not cover a refill.  Her last Rx was sent with qty of #1 inhaler with 5 refills. Pt is aware that we will call and why she is running short on medication monthly.  Called [facility] and spoke with Adah Salvage, patients Rx is only lasting 20 days but there should not be an issue with getting this refilled monthly as it is only 10 days early. Adah Salvage states that the patient is out of refills at this time and we will need to send another Rx for further refills of this medication.   Called and spoke with patient, aware that she should not be running out of medication before the 20 day mark if she is taking it as prescribed. Pt states that some days she takes it more than TID and this is why she is running shorter and the insurance is denying coverage of the medication. Pt states that some days she has more SOB than others and takes the inhaler QID rather than TID. Pt understands that this is why she is running short and its is being denied. Pt is asking what else can be done to help gt her symptoms under control since the Combivent does not seem to be doing as well of a job as it used to. Pt is wanting to know if she maybe needs another inhaler added on to her current regimen or if the inhaler needs to be changed all together.  Patient states that she noticed more increased SOB and not feeling well since taking the High Dose Flu Vaccine 07/03/16 - states that she started feeling bad a few days later. Not sure if this could be contributing or if her breathing is just worsening overall.   Pt aware that Dr Lamonte Sakai is not in the office and will not be available until next. Pt not okay with waiting for RB to return and wants rec's today from another MD. Pt aware that I will send this message to the physician on call and we will call  back with any rec's that can be offered.   Please advise Dr Annamaria Boots. Thanks.      Medication List       Accurate as of 07/19/16 12:15 PM. Always use your most recent med list.          hydrochlorothiazide 25 MG tablet Commonly known as:  HYDRODIURIL TAKE ONE TABLET BY MOUTH DAILY   Ipratropium-Albuterol 20-100 MCG/ACT Aers respimat Commonly known as:  COMBIVENT Inhale 2 puffs into the lungs 3 (three) times daily.   losartan 50 MG tablet Commonly known as:  COZAAR Take 1 tablet (50 mg total) by mouth daily.   rosuvastatin 5 MG tablet Commonly known as:  CRESTOR Take 1 tablet (5 mg total) by mouth daily.   SYNTHROID 88 MCG tablet Generic drug:  levothyroxine Take 1 tablet (88 mcg total) by mouth daily before breakfast.      Allergies  Allergen Reactions  . Symbicort [Budesonide-Formoterol Fumarate] Other (See Comments)    Throat swelling

## 2016-07-19 NOTE — Telephone Encounter (Signed)
Ok to offer Anoro sample     Inhale 1 puff, once daily    Back up Rx # 1, refill x 12  May still use Combivent Respimat  1 puff, not more than 3 times daily if needed

## 2016-07-19 NOTE — Telephone Encounter (Signed)
Called and spoke with pt and she is aware of anoro that has been sent to her pharmacy.  She stated that she is worried about taking this due to the side effects that she has seen on the tv, but pt requested that I send this in and she will try it.  She stated that she knows that she cannot pick up the combivent until 9/28.  Refill has been sent in for this as well.

## 2016-07-23 ENCOUNTER — Telehealth: Payer: Self-pay

## 2016-07-23 NOTE — Telephone Encounter (Signed)
Called pt back.

## 2016-07-23 NOTE — Telephone Encounter (Signed)
Patient is requesting a referral for GI to see Dr. Henrene Pastor. She has [employer]. Please follow up, once patient can make app. Thank you.

## 2016-08-09 ENCOUNTER — Ambulatory Visit (INDEPENDENT_AMBULATORY_CARE_PROVIDER_SITE_OTHER): Payer: Commercial Managed Care - HMO | Admitting: Family

## 2016-08-09 ENCOUNTER — Encounter: Payer: Self-pay | Admitting: Family

## 2016-08-09 ENCOUNTER — Other Ambulatory Visit: Payer: Commercial Managed Care - HMO

## 2016-08-09 VITALS — BP 110/78 | HR 91 | Temp 98.7°F | Resp 14 | Ht 66.25 in | Wt 96.0 lb

## 2016-08-09 DIAGNOSIS — R1012 Left upper quadrant pain: Secondary | ICD-10-CM

## 2016-08-09 DIAGNOSIS — R0602 Shortness of breath: Secondary | ICD-10-CM | POA: Insufficient documentation

## 2016-08-09 DIAGNOSIS — R1010 Upper abdominal pain, unspecified: Secondary | ICD-10-CM | POA: Insufficient documentation

## 2016-08-09 MED ORDER — ALBUTEROL SULFATE (2.5 MG/3ML) 0.083% IN NEBU
2.5000 mg | INHALATION_SOLUTION | Freq: Once | RESPIRATORY_TRACT | Status: AC
  Administered 2016-08-09: 2.5 mg via RESPIRATORY_TRACT

## 2016-08-09 MED ORDER — PANTOPRAZOLE SODIUM 40 MG PO PACK: 40.0000 mg | PACK | Freq: Every day | ORAL | 0 refills | Status: DC

## 2016-08-09 NOTE — Assessment & Plan Note (Signed)
Shortness of breath appears mild. In office albuterol breathing treatment provided with some improvement in symptoms at completion. Continue COPD medications.

## 2016-08-09 NOTE — Progress Notes (Signed)
Subjective:    Patient ID: Kristin [name], female    DOB: Mar 17, 1946, 70 y.o.   MRN: [id number]  Chief Complaint  Patient presents with  . Abdominal Pain    states she is having severe stomach pain, states she is having difficulty breathing, not able to eat much, can not lay down or sit without it hurting, x2 weeks    HPI:  Kristin [name] is a 70 y.o. 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 and presents today for an acute office visit.  This is a new problem. Associated symptom of pain located in her abdomen has been going on for about 2 weeks. Pain is primarily located on the left side. Pain is described as someone has a fist there and is moving it. Modifying factors include antacid which helped her symptoms to where she can manage. Course of symptoms is generally worsening. There is some constipation, but no nausea, vomiting, diarrhea, blood in stool or hematemsis. Denies any NSAID use. Does have a history of an ulcer in the past. No fevers. Pain is worsened with lying down and does have some shortness of breath. Food intake is decreased with occasional worsening.    Allergies  Allergen Reactions  . Symbicort [Budesonide-Formoterol Fumarate] Other (See Comments)    Throat swelling      Outpatient Medications Prior to Visit  Medication Sig Dispense Refill  . hydrochlorothiazide (HYDRODIURIL) 25 MG tablet TAKE ONE TABLET BY MOUTH DAILY 90 tablet 1  .  Ipratropium-Albuterol (COMBIVENT) 20-100 MCG/ACT AERS respimat Inhale 2 puffs into the lungs 3 (three) times daily. 1 Inhaler 5  . losartan (COZAAR) 50 MG tablet Take 1 tablet (50 mg total) by mouth daily. 90 tablet 1  . rosuvastatin (CRESTOR) 5 MG tablet Take 1 tablet (5 mg total) by mouth daily. 90 tablet 1  . SYNTHROID 88 MCG tablet Take 1 tablet (88 mcg total) by mouth daily before breakfast. 90 tablet 1  . umeclidinium-vilanterol (ANORO ELLIPTA) 62.5-25 MCG/INH AEPB Inhale 1 puff into the lungs daily. 1 each 11   No facility-administered medications prior to visit.       Past Surgical History:  Procedure Laterality Date  . ABDOMINAL HYSTERECTOMY W/ PARTIAL VAGINACTOMY    . BREAST BIOPSY Right   . COLONOSCOPY  05/19/2012  . ESOPHAGOGASTRODUODENOSCOPY  01/03/2011   normal   . THYROIDECTOMY, PARTIAL    . TONSILLECTOMY        Past Medical History:  Diagnosis Date  . Acute sinusitis, unspecified   . Cervicalgia   . Chest pain, unspecified   . COPD (chronic obstructive pulmonary disease) ([place])   . Degeneration of cervical intervertebral disc   . Diverticulosis of colon (without mention of hemorrhage)   . Esophageal reflux   . Hyperlipidemia   . IBS (irritable bowel syndrome)   . 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 Obstructive chronic bronchitis with exacerbation ([place])   . Personal history of peptic ulcer disease   . Unspecified essential hypertension   . Unspecified hypothyroidism   . Unspecified vitamin D deficiency   . Vitiligo       Review of Systems  Constitutional:  Negative for chills and fever.  Respiratory: Positive for shortness of breath. Negative for chest tightness.   Cardiovascular: Negative for chest pain, palpitations and leg swelling.  Gastrointestinal: Positive for abdominal pain. Negative for anal bleeding, constipation, diarrhea, nausea and vomiting.      Objective:    BP 110/78 (BP Location: Left Arm, Patient Position: Sitting, Cuff Size: Normal)    Pulse 91   Temp 98.7 F (37.1 C) (Oral)   Resp 14   Ht 5' 6.25" (1.683 m)   Wt 96 lb (43.5 kg)   SpO2 98%   BMI 15.38 kg/m  Nursing note and vital signs reviewed.  Physical Exam  Constitutional: She is oriented to person, place, and time. She appears well-developed and well-nourished. No distress.  Cardiovascular: Normal rate, regular rhythm, normal heart sounds and intact distal pulses.   Pulmonary/Chest: Effort normal and breath sounds normal.  Abdominal: Normal appearance and bowel sounds are normal. She exhibits no mass. There is no hepatosplenomegaly. There is tenderness. There is no rigidity, no rebound, no guarding and negative Murphy's sign.  Neurological: She is alert and oriented to person, place, and time.  Skin: Skin is warm and dry.  Psychiatric: She has a normal mood and affect. Her behavior is normal. Judgment and thought content normal.       Assessment & Plan:   Problem List Items Addressed This Visit      Other   Abdominal pain, left upper quadrant - Primary    Left upper abdominal pain concerning for peptic ulcer disease. Obtain H. pylori. Start pantoprazole. Patient does have significant history of inability to swallow pills and most PPIs and unable to be crushed therefore liquid pantoprazole sent to pharmacy. GERD diet information provided and after visit summary. Follow-up pending blood work or if symptoms worsen or do not improve.      Relevant Medications   pantoprazole sodium (PROTONIX) 40 mg/20 mL PACK   Other Relevant Orders   H Pylori, IGM, IGG, IGA AB   SOB (shortness of breath)    Shortness of breath appears mild. In office albuterol breathing treatment provided with some improvement in symptoms at completion. Continue COPD medications.       Relevant Medications   albuterol (PROVENTIL) (2.5 MG/3ML) 0.083% nebulizer solution 2.5 mg (Completed)    Other Visit Diagnoses   None.      I am having Kristin [name] start on pantoprazole sodium. I am  also having her maintain her losartan, rosuvastatin, SYNTHROID, hydrochlorothiazide, umeclidinium-vilanterol, and Ipratropium-Albuterol. We administered albuterol.   Meds ordered this encounter  Medications  . pantoprazole sodium (PROTONIX) 40 mg/20 mL PACK    Sig: Take 20 mLs (40 mg total) by mouth daily.    Dispense:  30 each    Refill:  0    Order Specific Question:   Supervising Provider    Answer:   Pricilla Holm A [id number]  . albuterol (PROVENTIL) (2.5 MG/3ML) 0.083% nebulizer solution 2.5 mg     Follow-up: Return if symptoms worsen or fail to improve.  Mauricio Po, FNP

## 2016-08-09 NOTE — Assessment & Plan Note (Signed)
Left upper abdominal pain concerning for peptic ulcer disease. Obtain H. pylori. Start pantoprazole. Patient does have significant history of inability to swallow pills and most PPIs and unable to be crushed therefore liquid pantoprazole sent to pharmacy. GERD diet information provided and after visit summary. Follow-up pending blood work or if symptoms worsen or do not improve.

## 2016-08-09 NOTE — Patient Instructions (Signed)
Thank you for choosing [facility].  SUMMARY AND INSTRUCTIONS:  Medication:  Your prescription(s) have been submitted to your pharmacy or been printed and provided for you. Please take as directed and contact our office if you believe you are having problem(s) with the medication(s) or have any questions.  Labs:  Please stop by the lab on the lower level of the building for your blood work. Your results will be released to [place] (or called to you) after review, usually within 72 hours after test completion. If any changes need to be made, you will be notified at that same time.  1.) The lab is open from 7:30am to 5:30 pm Monday-Friday 2.) No appointment is necessary 3.) Fasting (if needed) is 6-8 hours after food and drink; black coffee and water are okay   Follow up:  If your symptoms worsen or fail to improve, please contact our office for further instruction, or in case of emergency go directly to the emergency room at the closest medical facility.     Peptic Ulcer A peptic ulcer is a sore in the lining of your esophagus (esophageal ulcer), stomach (gastric ulcer), or in the first part of your small intestine (duodenal ulcer). The ulcer causes erosion into the deeper tissue. CAUSES  Normally, the lining of the stomach and the small intestine protects itself from the acid that digests food. The protective lining can be damaged by:  An infection caused by a bacterium called Helicobacter pylori (H. pylori).  Regular use of nonsteroidal anti-inflammatory drugs (NSAIDs), such as ibuprofen or aspirin.  Smoking tobacco. Other risk factors include being older than 60, drinking alcohol excessively, and having a family history of ulcer disease.  SYMPTOMS   Burning pain or gnawing in the area between the chest and the belly button.  Heartburn.  Nausea and vomiting.  Bloating. The pain can be worse on an empty stomach and at night. If the ulcer results in bleeding, it can  cause:  Black, tarry stools.  Vomiting of bright red blood.  Vomiting of coffee-ground-looking materials. DIAGNOSIS  A diagnosis is usually made based upon your history and an exam. Other tests and procedures may be performed to find the cause of the ulcer. Finding a cause will help determine the best treatment. Tests and procedures may include:  Blood tests, stool tests, or breath tests to check for the bacterium H. pylori.  An upper gastrointestinal (GI) series of the esophagus, stomach, and small intestine.  An endoscopy to examine the esophagus, stomach, and small intestine.  A biopsy. TREATMENT  Treatment may include:  Eliminating the cause of the ulcer, such as smoking, NSAIDs, or alcohol.  Medicines to reduce the amount of acid in your digestive tract.  Antibiotic medicines if the ulcer is caused by the H. pylori bacterium.  An upper endoscopy to treat a bleeding ulcer.  Surgery if the bleeding is severe or if the ulcer created a hole somewhere in the digestive system. HOME CARE INSTRUCTIONS   Avoid tobacco, alcohol, and caffeine. Smoking can increase the acid in the stomach, and continued smoking will impair the healing of ulcers.  Avoid foods and drinks that seem to cause discomfort or aggravate your ulcer.  Only take medicines as directed by your caregiver. Do not substitute over-the-counter medicines for prescription medicines without talking to your caregiver.  Keep any follow-up appointments and tests as directed. SEEK MEDICAL CARE IF:   Your do not improve within 7 days of starting treatment.  You have ongoing  indigestion or heartburn. SEEK IMMEDIATE MEDICAL CARE IF:   You have sudden, sharp, or persistent abdominal pain.  You have bloody or dark black, tarry stools.  You vomit blood or vomit that looks like coffee grounds.  You become light-headed, weak, or feel faint.  You become sweaty or clammy. MAKE SURE YOU:   Understand these  instructions.  Will watch your condition.  Will get help right away if you are not doing well or get worse.   This information is not intended to replace advice given to you by your health care provider. Make sure you discuss any questions you have with your health care provider.   Document Released: 10/12/2000 Document Revised: 11/05/2014 Document Reviewed: 05/14/2012 Elsevier Interactive Patient Education 2016 [place] for Peptic Ulcer Disease When you have peptic ulcer disease, the foods you eat and your eating habits are very important. Choosing the right foods can help ease the discomfort of peptic ulcer disease. WHAT GENERAL GUIDELINES DO I NEED TO FOLLOW?  Choose fruits, vegetables, whole grains, and low-fat meat, fish, and poultry.   Keep a food diary to identify foods that cause symptoms.  Avoid foods that cause irritation or pain. These may be different for different people.  Eat frequent small meals instead of three large meals each day. The pain may be worse when your stomach is empty.  Avoid eating close to bedtime. WHAT FOODS ARE NOT RECOMMENDED? The following are some foods and drinks that may worsen your symptoms:  Black, white, and red pepper.  Hot sauce.  Chili peppers.  Chili powder.  Chocolate and cocoa.   Alcohol.  Tea, coffee, and cola (regular and decaffeinated). The items listed above may not be a complete list of foods and beverages to avoid. Contact your dietitian for more information.   This information is not intended to replace advice given to you by your health care provider. Make sure you discuss any questions you have with your health care provider.   Document Released: 01/07/2012 Document Revised: 10/20/2013 Document Reviewed: 08/19/2013 Elsevier Interactive Patient Education [employer].

## 2016-08-11 LAB — H PYLORI, IGM, IGG, IGA AB
H pylori, IgM Abs: <9 units (ref 0.0–8.9)
H. pylori, IgA Abs: <9 units (ref 0.0–8.9)

## 2016-08-13 ENCOUNTER — Encounter: Payer: Self-pay | Admitting: Family

## 2016-08-14 ENCOUNTER — Other Ambulatory Visit: Payer: Self-pay

## 2016-08-14 MED ORDER — OMEPRAZOLE 40 MG PO CPDR: 40.0000 mg | DELAYED_RELEASE_CAPSULE | Freq: Every day | ORAL | 0 refills | Status: DC

## 2016-08-16 ENCOUNTER — Telehealth: Payer: Self-pay | Admitting: *Deleted

## 2016-08-16 MED ORDER — LANSOPRAZOLE 30 MG PO TBDP: 30.0000 mg | ORAL_TABLET | Freq: Every day | ORAL | 2 refills | Status: DC

## 2016-08-16 NOTE — Telephone Encounter (Signed)
Medication sent to pharmacy  

## 2016-08-16 NOTE — Telephone Encounter (Signed)
Notified pt Kristin [name] sent rx to [place] [facility]...Kristin [name]

## 2016-08-16 NOTE — Telephone Encounter (Signed)
Rec'd call pt states the omeprazole that was sent in in a time release capsule, and per pharmacist she can not crush or open. She is not able to swallow. Pharmacist states if she can get rx for Prevacid solotab that she can crush...Kristin [name]

## 2016-10-15 ENCOUNTER — Encounter: Payer: Self-pay | Admitting: Emergency Medicine

## 2016-10-15 ENCOUNTER — Ambulatory Visit (INDEPENDENT_AMBULATORY_CARE_PROVIDER_SITE_OTHER): Payer: Commercial Managed Care - HMO | Admitting: Emergency Medicine

## 2016-10-15 VITALS — BP 116/58 | HR 101 | Ht 66.25 in | Wt 89.4 lb

## 2016-10-15 DIAGNOSIS — J441 Chronic obstructive pulmonary disease with (acute) exacerbation: Secondary | ICD-10-CM

## 2016-10-15 MED ORDER — ALBUTEROL SULFATE (2.5 MG/3ML) 0.083% IN NEBU: 2.5000 mg | INHALATION_SOLUTION | RESPIRATORY_TRACT | 12 refills | Status: DC | PRN

## 2016-10-15 MED ORDER — LEVALBUTEROL HCL 0.63 MG/3ML IN NEBU
0.6300 mg | INHALATION_SOLUTION | Freq: Once | RESPIRATORY_TRACT | Status: AC
  Administered 2016-10-15: 0.63 mg via RESPIRATORY_TRACT

## 2016-10-15 MED ORDER — IPRATROPIUM-ALBUTEROL 20-100 MCG/ACT IN AERS: 2.0000 | INHALATION_SPRAY | Freq: Three times a day (TID) | RESPIRATORY_TRACT | 5 refills | Status: DC

## 2016-10-15 NOTE — Assessment & Plan Note (Signed)
Her COPD appears to be well compensated at this time. She is not wheezing on exam. Did not try Anoro because she was worried about side effects. I explained these to her today and recommended that she try it but she wants to stick with Combivent tid. Flu shot up to date. Many of her sx appear to be be functional with some psych overlay. For example she has no wheeze on exam today but she is asking for nebulized albuterol. I think reassurance and clarifying her medical regimen should help. She would like nebulized albuterol for home - will write for this. She also requests an inogen POC - I can write the script for this, do not know if she will be able to secure thru her DME. She may have to work directly w the [employer]

## 2016-10-15 NOTE — Patient Instructions (Addendum)
Please continue your Combivent 2 puffs three times a day.  Take albuterol 2 puffs up to every 4 hours if needed for shortness of breath.  Follow with Dr Elna Breslow and Henrene Pastor regarding your swallowing  Flu shot up to date  Continue to work on stopping smoking Follow with Dr Lamonte Sakai in 6 months or sooner if you have any problems  .

## 2016-10-15 NOTE — Progress Notes (Signed)
Subjective:    Patient ID: Kristin [name], female    DOB: 08/04/1946  MRN: [id number]   Brief patient profile:  34 yobf smoker with emphysema by ct referred by Dr Etter Sjogren 03/20/2013 to pulmonary clinic.  ROV 01/05/16 -- new patient visit for Kristin [name]. 70 year old woman, current smoker (30 pack years), with a history of COPD, chronic rhinitis, chronic cough, GERD, irritable bowel syndrome. She has been followed by Dr Melvyn Novas, underwent pulmonary function testing in July 2014 that showed severe obstructive lung disease with an FEV1 of 25 L, 34% predicted, a positive bronchodilator response. She was started on Symbicort in December 2016 but had stopped this medication due to upper airway irritation and the feeling that her "throat was closing". It has been added to her allergy list. She was started instead on Combivent which she takes prn. She underwent spirometry today that I have personally reviewed. This showed no change in her FEV1, currently 0.84 L or 40% predicted. She describes UA irritation, occasional VC closure, dysphagia that can prevent swallowing. Being evaluated by Dr Henrene Pastor - no evidence of obstruction. She has allergies, not on meds and would like to avoid if possible. She feels that her inability to eat not outweighs her other problems.   ROV 04/17/16 -- follow-up visit for COPD, continued tobacco use, severe obstruction on spirometry. Also with some symptoms that are reminiscent of upper airway irritation and possible vocal cord dysfunction. She also has symptoms consistent with GERD and possible dysphagia. I started pantoprazole 40 mg last visit. I also changed her Combivent to 3 times a day scheduled. Her scleroderma antibody was negative. She feels that the scheduled combivent has been beneficial. She still has marginal PO - having difficulty w reflux. She didn't start pantoprazole > unable to crush, couldn';t get the liquid version.  Smokes about 1-2 cig a day.   ROV 10/15/16 -- Follow-up visit for  patient with a history of tobacco use, COPD, chronic cough with chronic upper airway irritation syndrome in the setting of chronic rhinitis and GERD. She also has irritable bowel syndrome. At her last visit in June we continued Combivent. She was given prevacid in the past - unable to swallow the pill, trying to get liquid. 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 She tells me that she was tried on Anoro back in September but she never took it, was concerned that she didn't know the side effects. She tells me today that she doesn't want to try it. She denies any cough or wheeze, no mucous. Continues to smoker about 2-3 cig a day.       Objective:   Vitals:   10/15/16 0902 10/15/16 0903  BP:  (!) 116/58  Pulse:  (!) 101  SpO2:  94%  Weight: 89 lb 6.4 oz (40.6 kg)   Height: 5' 6.25" (1.683 m)    Gen: Pleasant, thin woman, in no distress,  normal affect  ENT: No lesions,  mouth clear,  oropharynx clear, no postnasal drip  Neck: No JVD, no TMG, no carotid bruits  Lungs: No use of accessory muscles, clear without rales or rhonchi  Cardiovascular: RRR, heart sounds normal, no murmur or gallops, no peripheral edema  Musculoskeletal: No deformities, no cyanosis or clubbing  Neuro: alert, non focal  Skin: Warm, some tightness that is suggestive of possible scleroderma.     Assessment & Plan:  COPD GOLD III Her COPD appears to be well compensated at this time. She is not wheezing  on exam. Did not try Anoro because she was worried about side effects. I explained these to her today and recommended that she try it but she wants to stick with Combivent tid. Flu shot up to date. Many of her sx appear to be be functional with some psych overlay. For example she has no wheeze on exam today but she is asking for nebulized albuterol. I think reassurance and clarifying her medical regimen should help. She would like nebulized albuterol for home - will write for this. She also requests  an inogen POC - I can write the script for this, do not know if she will be able to secure thru her DME. She may have to work directly w the [employer]any  Baltazar Apo, MD, PhD 10/15/2016, 9:25 AM [facility] [phone] or if no answer [phone]

## 2016-10-18 DIAGNOSIS — J449 Chronic obstructive pulmonary disease, unspecified: Secondary | ICD-10-CM | POA: Diagnosis not present

## 2016-11-18 DIAGNOSIS — J449 Chronic obstructive pulmonary disease, unspecified: Secondary | ICD-10-CM | POA: Diagnosis not present

## 2016-12-19 ENCOUNTER — Other Ambulatory Visit: Payer: Self-pay | Admitting: Family

## 2016-12-19 DIAGNOSIS — J449 Chronic obstructive pulmonary disease, unspecified: Secondary | ICD-10-CM | POA: Diagnosis not present

## 2016-12-19 DIAGNOSIS — I10 Essential (primary) hypertension: Secondary | ICD-10-CM

## 2017-01-01 ENCOUNTER — Other Ambulatory Visit: Payer: Self-pay | Admitting: Family

## 2017-01-01 DIAGNOSIS — I10 Essential (primary) hypertension: Secondary | ICD-10-CM

## 2017-01-10 ENCOUNTER — Other Ambulatory Visit: Payer: Self-pay | Admitting: Family

## 2017-01-10 DIAGNOSIS — Z1231 Encounter for screening mammogram for malignant neoplasm of breast: Secondary | ICD-10-CM

## 2017-01-16 DIAGNOSIS — J449 Chronic obstructive pulmonary disease, unspecified: Secondary | ICD-10-CM | POA: Diagnosis not present

## 2017-02-04 ENCOUNTER — Other Ambulatory Visit: Payer: Self-pay | Admitting: Family

## 2017-02-04 DIAGNOSIS — E782 Mixed hyperlipidemia: Secondary | ICD-10-CM

## 2017-02-05 ENCOUNTER — Encounter: Payer: Self-pay | Admitting: Family

## 2017-02-05 ENCOUNTER — Ambulatory Visit (INDEPENDENT_AMBULATORY_CARE_PROVIDER_SITE_OTHER): Payer: Medicare HMO | Admitting: Family

## 2017-02-05 ENCOUNTER — Other Ambulatory Visit (INDEPENDENT_AMBULATORY_CARE_PROVIDER_SITE_OTHER): Payer: Medicare HMO

## 2017-02-05 ENCOUNTER — Inpatient Hospital Stay: Admission: RE | Admit: 2017-02-05 | Payer: Medicare HMO | Source: Ambulatory Visit

## 2017-02-05 VITALS — BP 102/64 | HR 90 | Temp 97.6°F | Resp 20 | Ht 66.25 in | Wt 91.8 lb

## 2017-02-05 DIAGNOSIS — R1010 Upper abdominal pain, unspecified: Secondary | ICD-10-CM | POA: Diagnosis not present

## 2017-02-05 LAB — BASIC METABOLIC PANEL
BUN: 9 mg/dL (ref 6–23)
CO2: 34 mEq/L — ABNORMAL HIGH (ref 19–32)
CREATININE: 0.76 mg/dL (ref 0.40–1.20)
Calcium: 10 mg/dL (ref 8.4–10.5)
Chloride: 99 mEq/L (ref 96–112)
GFR: 97.06 mL/min (ref >60.00)
Glucose, Bld: 100 mg/dL — ABNORMAL HIGH (ref 70–99)
POTASSIUM: 4.5 meq/L (ref 3.5–5.1)
SODIUM: 137 meq/L (ref 135–145)

## 2017-02-05 LAB — CBC
HCT: 41.6 % (ref 36.0–46.0)
Hemoglobin: 13.9 g/dL (ref 12.0–15.0)
MCHC: 33.5 g/dL (ref 30.0–36.0)
MCV: 95.9 fl (ref 78.0–100.0)
Platelets: 260 10*3/uL (ref 150.0–400.0)
RBC: 4.34 Mil/uL (ref 3.87–5.11)
RDW: 13.8 % (ref 11.5–15.5)
WBC: 3.4 10*3/uL — AB (ref 4.0–10.5)

## 2017-02-05 LAB — HEPATIC FUNCTION PANEL
ALK PHOS: 66 U/L (ref 39–117)
ALT: 13 U/L (ref 0–35)
AST: 22 U/L (ref 0–37)
Albumin: 4.5 g/dL (ref 3.5–5.2)
BILIRUBIN DIRECT: 0.3 mg/dL (ref 0.0–0.3)
Total Bilirubin: 1.2 mg/dL (ref 0.2–1.2)
Total Protein: 7.5 g/dL (ref 6.0–8.3)

## 2017-02-05 LAB — AMYLASE: Amylase: 65 U/L (ref 27–131)

## 2017-02-05 LAB — LIPASE: LIPASE: 16 U/L (ref 11.0–59.0)

## 2017-02-05 MED ORDER — NIZATIDINE 15 MG/ML PO SOLN: 150.0000 mg | Freq: Two times a day (BID) | ORAL | 1 refills | Status: DC

## 2017-02-05 NOTE — Assessment & Plan Note (Signed)
Worsening upper abdominal pain 1 month with concern for peptic ulcer disease, cholelithiasis, or pancreatitis. Obtain amylase, basic metabolic profile, CBC, and hepatic function panel. Attempted to obtain stat CT scan with patient deferring at this time. Start Axid solution. Recommend follow-up with gastroenterology or seeking further care if symptoms worsen or do not improve with medication and pending imaging results.

## 2017-02-05 NOTE — Progress Notes (Signed)
Subjective:    Patient ID: Kristin [name], female    DOB: [birth year], 71 y.o.   MRN: [id number]  Chief Complaint  Patient presents with  . Abdominal Pain    having lower left quadrant pain that is constant, issues swallowing, losing weight bc she is unable to eat     HPI:  Kristin [name] is a 71 y.o. 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 and presents today for an office visit.   Associated symptom of pain located in the lower left quadrant of her abdomen has been worsening for about 1 month. Severity is effecting her appetite and has been unable to eat with resulting weight loss per patient. The pain is constant and described as alternating between sharp and dull. Previous CT scan of abdomen and pelvis in March of 2016 showed prominent stool throughout the colon and stable hepatic cysts. Severity of the pain is currently 12/10. Modifying factors include antiacids which resulted in her having cramps. Alternating between constipation or diarrhea. No nausea or vomiting. No blood in her stool. No fevers, chills, or night sweats. Denies NSAIDs, aspirin, or BC powder usage.    Wt Readings from Last 3 Encounters:  02/05/17 91 lb 12.8 oz (41.6 kg)  10/15/16 89 lb 6.4 oz (40.6 kg)  08/09/16 96 lb (43.5 kg)     Allergies  Allergen Reactions  . Symbicort [Budesonide-Formoterol Fumarate] Other (See Comments)    Throat swelling      Outpatient Medications Prior to Visit   Medication Sig Dispense Refill  . albuterol (PROVENTIL) (2.5 MG/3ML) 0.083% nebulizer solution Take 3 mLs (2.5 mg total) by nebulization every 4 (four) hours as needed for wheezing or shortness of breath. 75 mL 12  . hydrochlorothiazide (HYDRODIURIL) 25 MG tablet TAKE ONE TABLET BY MOUTH ONCE DAILY 90 tablet 1  . Ipratropium-Albuterol (COMBIVENT) 20-100 MCG/ACT AERS respimat Inhale 2 puffs into the lungs 3 (three) times daily. 1 Inhaler 5  . losartan (COZAAR) 50 MG tablet TAKE ONE TABLET BY MOUTH  DAILY 90 tablet 1  . pantoprazole sodium (PROTONIX) 40 mg/20 mL PACK Take 20 mLs (40 mg total) by mouth daily. 30 each 0  . rosuvastatin (CRESTOR) 5 MG tablet Take 1 tablet (5 mg total) by mouth daily. 90 tablet 1  . SYNTHROID 88 MCG tablet Take 1 tablet (88 mcg total) by mouth daily before breakfast. 90 tablet 1  . albuterol (PROVENTIL) (2.5 MG/3ML) 0.083% nebulizer solution Take 3 mLs (2.5 mg total) by nebulization every 4 (four) hours as needed for wheezing or shortness of breath. 75 mL 12  . lansoprazole (PREVACID SOLUTAB) 30 MG disintegrating tablet Take 1 tablet (30 mg total) by mouth daily. (Patient not taking: Reported on 10/15/2016) 30 tablet 2  . omeprazole (PRILOSEC) 40 MG capsule Take 1 capsule (40 mg total) by mouth daily. 30 capsule 0  . umeclidinium-vilanterol (ANORO ELLIPTA) 62.5-25 MCG/INH AEPB Inhale 1 puff into the lungs daily. (Patient not taking: Reported on 10/15/2016) 1 each 11   No facility-administered medications prior to visit.       Past Surgical History:  Procedure Laterality Date  . ABDOMINAL HYSTERECTOMY W/ PARTIAL  VAGINACTOMY    . BREAST BIOPSY Right   . COLONOSCOPY  05/19/2012  . ESOPHAGOGASTRODUODENOSCOPY  01/03/2011   normal   . THYROIDECTOMY, PARTIAL    . TONSILLECTOMY        Past Medical History:  Diagnosis Date  . Acute sinusitis, unspecified   . Cervicalgia   . Chest pain, unspecified   . COPD (chronic obstructive pulmonary disease) ([place])   .  Degeneration of cervical intervertebral disc   . Diverticulosis of colon (without mention of hemorrhage)   . Esophageal reflux   . Hyperlipidemia   . IBS (irritable bowel syndrome)   . 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 Obstructive chronic bronchitis with exacerbation ([place])   . Personal history of peptic ulcer disease   . Unspecified essential hypertension   . Unspecified hypothyroidism   . Unspecified vitamin D deficiency   . Vitiligo       Review of Systems  Constitutional: Negative for chills and fever.  Respiratory: Negative for chest tightness, shortness of breath and wheezing.   Cardiovascular: Negative for chest pain.  Gastrointestinal: Positive for abdominal pain. Negative for blood in stool, constipation, diarrhea, nausea, rectal pain and vomiting.      Objective:    BP 102/64 (BP Location: Left Arm, Patient Position: Sitting, Cuff Size: Normal)   Pulse 90   Temp 97.6 F (36.4 C) (Oral)   Resp 20   Ht 5' 6.25" (1.683 m)   Wt 91 lb 12.8 oz (41.6 kg)   SpO2 92%   BMI 14.71 kg/m  Nursing note and vital signs reviewed.  Physical Exam  Constitutional: She is oriented to person, place, and time. She appears cachectic. She has a sickly appearance. No distress.  Cardiovascular: Normal rate, regular rhythm, normal heart sounds and intact distal pulses.   Pulmonary/Chest: Effort normal and breath sounds normal.  Abdominal: Normal appearance and bowel sounds are normal. There is no hepatosplenomegaly. There is tenderness in the epigastric area and left upper quadrant. There is no rigidity, no rebound, no guarding, no tenderness at McBurney's point and negative Murphy's sign.  Neurological: She is alert and oriented to person, place, and time.  Skin: Skin is warm and dry.  Psychiatric: She has a normal mood and affect. Her behavior is normal. Judgment and thought content normal.       Assessment & Plan:   Problem List Items Addressed This Visit      Other   Upper  abdominal pain - Primary    Worsening upper abdominal pain 1 month with concern for peptic ulcer disease, cholelithiasis, or pancreatitis. Obtain amylase, basic metabolic profile, CBC, and hepatic function panel. Attempted to obtain stat CT scan with patient deferring at this time. Start Axid solution. Recommend follow-up with gastroenterology or seeking further care if symptoms worsen or do not improve with medication and pending imaging results.      Relevant Medications   nizatidine (AXID) 15 MG/ML SOLN   Other Relevant Orders   CBC   Amylase   Lipase   CT ABDOMEN PELVIS W CONTRAST   Basic Metabolic Panel (BMET)   Hepatic function panel   CT ABDOMEN W CONTRAST       I have discontinued Ms. Mezo's umeclidinium-vilanterol, omeprazole, and lansoprazole. I am also having her start on nizatidine. Additionally, I am having her maintain her rosuvastatin, SYNTHROID, pantoprazole sodium, albuterol, Ipratropium-Albuterol, losartan, and hydrochlorothiazide.   Meds ordered this encounter  Medications  . nizatidine (AXID) 15 MG/ML SOLN    Sig:  Take 10 mLs (150 mg total) by mouth 2 (two) times daily.    Dispense:  600 mL    Refill:  1    Order Specific Question:   Supervising Provider    Answer:   Pricilla Holm A [[id number]]     Follow-up: Return if symptoms worsen or fail to improve.  Mauricio Po, FNP

## 2017-02-05 NOTE — Patient Instructions (Signed)
Thank you for choosing [facility].  SUMMARY AND INSTRUCTIONS:  Please start the Axid for reflux.   They will call to schedule your CT scan.  We will complete blood work today.  If your symptoms do not improve or worsen, go to the emergency room.    Medication:  Your prescription(s) have been submitted to your pharmacy or been printed and provided for you. Please take as directed and contact our office if you believe you are having problem(s) with the medication(s) or have any questions.  Labs:  Please stop by the lab on the lower level of the building for your blood work. Your results will be released to [place] (or called to you) after review, usually within 72 hours after test completion. If any changes need to be made, you will be notified at that same time.  1.) The lab is open from 7:30am to 5:30 pm Monday-Friday 2.) No appointment is necessary 3.) Fasting (if needed) is 6-8 hours after food and drink; black coffee and water are okay   Imaging / Radiology:  Please stop by radiology on the basement level of the building for your x-rays. Your results will be released to [place] (or called to you) after review, usually within 72 hours after test completion. If any treatments or changes are necessary, you will be notified at that same time.  Follow up:  If your symptoms worsen or fail to improve, please contact our office for further instruction, or in case of emergency go directly to the emergency room at the closest medical facility.    Acute Pancreatitis Acute pancreatitis is a condition in which the pancreas suddenly gets irritated and swollen (has inflammation). The pancreas is a large gland behind the stomach. It makes enzymes that help to digest food. The pancreas also makes hormones that help to control your blood sugar. Acute pancreatitis happens when the enzymes attack the pancreas and damage it. Most attacks last a couple of days and can cause serious  problems. Follow these instructions at home: Eating and drinking   Follow instructions from your doctor about diet. You may need to:  Avoid alcohol.  Limit how much fat is in your diet.  Eat small meals often. Avoid eating big meals.  Drink enough fluid to keep your pee (urine) clear or pale yellow.  Do not drink alcohol if it caused your condition. General instructions   Take over-the-counter and prescription medicines only as told by your doctor.  Do not use any tobacco products. These include cigarettes, chewing tobacco, and e-cigarettes. If you need help quitting, ask your doctor.  Get plenty of rest.  If directed, check your blood sugar at home as told by your doctor.  Keep all follow-up visits as told by your doctor. This is important. Contact a doctor if:  You do not get better as quickly as expected.  You have new symptoms.  Your symptoms get worse.  You have lasting pain or weakness.  You continue to feel sick to your stomach (nauseous).  You get better and then you have another pain attack.  You have a fever. Get help right away if:  You cannot eat or keep fluids down.  Your pain becomes very bad.  Your skin or the white part of your eyes turns yellow (jaundice).  You throw up (vomit).  You feel dizzy or you pass out (faint).  Your blood sugar is high (over 300 mg/dL). This information is not intended to replace advice given to you  by your health care provider. Make sure you discuss any questions you have with your health care provider. Document Released: 04/02/2008 Document Revised: 03/22/2016 Document Reviewed: 07/19/2015 Elsevier Interactive Patient Education  2017 [employer].

## 2017-02-06 ENCOUNTER — Other Ambulatory Visit: Payer: Self-pay | Admitting: Family

## 2017-02-06 DIAGNOSIS — E782 Mixed hyperlipidemia: Secondary | ICD-10-CM

## 2017-02-07 ENCOUNTER — Encounter: Payer: Self-pay | Admitting: Family

## 2017-02-07 ENCOUNTER — Ambulatory Visit (INDEPENDENT_AMBULATORY_CARE_PROVIDER_SITE_OTHER)
Admission: RE | Admit: 2017-02-07 | Discharge: 2017-02-07 | Disposition: A | Discharge status: Home / Self Care | Payer: Medicare HMO | Source: Ambulatory Visit | Attending: Family | Admitting: Family

## 2017-02-07 DIAGNOSIS — R197 Diarrhea, unspecified: Secondary | ICD-10-CM | POA: Diagnosis not present

## 2017-02-07 DIAGNOSIS — R1010 Upper abdominal pain, unspecified: Secondary | ICD-10-CM

## 2017-02-07 MED ORDER — IOPAMIDOL (ISOVUE-300) INJECTION 61%
100.0000 mL | Freq: Once | INTRAVENOUS | Status: AC | PRN
  Administered 2017-02-07: 100 mL via INTRAVENOUS

## 2017-02-11 ENCOUNTER — Other Ambulatory Visit: Payer: Self-pay | Admitting: Family

## 2017-02-16 DIAGNOSIS — J449 Chronic obstructive pulmonary disease, unspecified: Secondary | ICD-10-CM | POA: Diagnosis not present

## 2017-02-21 ENCOUNTER — Ambulatory Visit
Admission: RE | Admit: 2017-02-21 | Discharge: 2017-02-21 | Disposition: A | Discharge status: Home / Self Care | Payer: Medicare HMO | Source: Ambulatory Visit | Attending: Family | Admitting: Family

## 2017-02-21 DIAGNOSIS — Z1231 Encounter for screening mammogram for malignant neoplasm of breast: Secondary | ICD-10-CM | POA: Diagnosis not present

## 2017-02-22 ENCOUNTER — Other Ambulatory Visit: Payer: Self-pay | Admitting: Family

## 2017-02-22 DIAGNOSIS — R928 Other abnormal and inconclusive findings on diagnostic imaging of breast: Secondary | ICD-10-CM

## 2017-02-26 ENCOUNTER — Telehealth: Payer: Self-pay | Admitting: Emergency Medicine

## 2017-02-26 NOTE — Telephone Encounter (Signed)
RB  Please Advise-  Pt called in and stated she is using her Combivent 2 puffs three times a day as you requested but she is running out of it and can not get refills early. She states she is going without for about 1 week until she can get another inhaler. She wanted to know if there is an additional inhaler that she can be taking with her combivent or what else do you suggest.

## 2017-02-27 NOTE — Telephone Encounter (Signed)
Patient returned phone call.Erica R Taylor ° °

## 2017-02-27 NOTE — Telephone Encounter (Signed)
Pt called to check on the statues of her message. Informed her we got the message to [place] and as soon as we hear from him then we will contact her back

## 2017-02-28 ENCOUNTER — Ambulatory Visit
Admission: RE | Admit: 2017-02-28 | Discharge: 2017-02-28 | Disposition: A | Discharge status: Home / Self Care | Payer: Medicare HMO | Source: Ambulatory Visit | Attending: Family | Admitting: Family

## 2017-02-28 DIAGNOSIS — R921 Mammographic calcification found on diagnostic imaging of breast: Secondary | ICD-10-CM | POA: Diagnosis not present

## 2017-02-28 DIAGNOSIS — R928 Other abnormal and inconclusive findings on diagnostic imaging of breast: Secondary | ICD-10-CM

## 2017-02-28 MED ORDER — IPRATROPIUM-ALBUTEROL 20-100 MCG/ACT IN AERS: 1.0000 | INHALATION_SPRAY | Freq: Four times a day (QID) | RESPIRATORY_TRACT | 5 refills | Status: DC

## 2017-02-28 NOTE — Telephone Encounter (Signed)
[facility] and spoke with pharmacist Raquel Sarna.  Pt is on the Combivent Respimat with 120 doses - pt is at max dose of 6 puffs per day which only lasts her 20 days.  Last refill was on 02/15/17  Per RB: let's try changing the dose to 1 puff QID and see how she does.  Called spoke with patient and discussed the above with her.  Pt only has about 30 doses left on her Rx and cannot refill it until 5.18.18.  She does wonder if she is having increased symptoms d/t allergies with increased SOB, wheezing, tightness, dry cough x5 days.  She denies any chest congestion, hemoptysis, f/c/s, or chest pain.  She has been using her albuterol neb every 4 hours to help save her Combivent.  She would also like to know if she can have a Proventil HFA to use to help her make her Combivent last longer until she can refill.  Spoke with RB - sounds like she needs an appt Called spoke with patient - she prefers early morning appts, 1st available at this time is on Monday 5.7.18 w/ SG @ 0930.  Pt okay with this date and time.  She is aware to change the combivent dosing, call the office for worsening symptoms if needed prior to office visit.  New Rx has been sent to verified pharmacy.  Nothing further needed at this time; will sign off.

## 2017-02-28 NOTE — Telephone Encounter (Signed)
combivent should last 2 puffs qid for an entire month (and she is only taking tid). Unclear to me why she is running out. Is there more than once inhaler size? If not then maybe she is using more than she said?

## 2017-03-04 ENCOUNTER — Encounter: Payer: Self-pay | Admitting: Acute Care

## 2017-03-04 ENCOUNTER — Telehealth: Payer: Self-pay | Admitting: Acute Care

## 2017-03-04 ENCOUNTER — Ambulatory Visit (INDEPENDENT_AMBULATORY_CARE_PROVIDER_SITE_OTHER): Payer: Medicare HMO | Admitting: Acute Care

## 2017-03-04 DIAGNOSIS — J449 Chronic obstructive pulmonary disease, unspecified: Secondary | ICD-10-CM | POA: Diagnosis not present

## 2017-03-04 MED ORDER — BUDESONIDE-FORMOTEROL FUMARATE 160-4.5 MCG/ACT IN AERO: 2.0000 | INHALATION_SPRAY | Freq: Two times a day (BID) | RESPIRATORY_TRACT | 0 refills | Status: DC

## 2017-03-04 MED ORDER — RANITIDINE HCL 150 MG PO TABS: 150.0000 mg | ORAL_TABLET | Freq: Every day | ORAL | 1 refills | Status: DC

## 2017-03-04 MED ORDER — FIRST-DUKES MOUTHWASH MT SUSP: 5.0000 mL | Freq: Four times a day (QID) | OROMUCOSAL | 0 refills | Status: DC

## 2017-03-04 NOTE — Progress Notes (Signed)
History of Present Illness Kristin [name] is a 71 y.o. female current smoker ( 30 pack years) with history of COPD, chronic rhinitis, chronic cough, GERD, irritable bowel syndrome. She is a previous patient of Dr. Melvyn Novas, but is now followed by Dr. Lamonte Sakai.   03/04/2017 Acute OV: Pt presents for acute visit. She states she has been feeling bad for the last 2 weeks. She has been using her CombiVent  2 puffs TID. She states she has run out of her medication and that she needs more.She states she cant breath and has a stomach ache. She feels there is a link between stomach ache and inability to breath.She has not started taking her Axid for stomach ache. She is worried about side effects.She states she has lost 30 pounds over the last 2 years due to stomach problems.She has not been to see her GI physician, but states she is going to go and get an appointment today. ( Dr. Henrene Pastor) She is using her albuterol nebs about twice daily. She is currently unable to get Combivent ( she was using 2 puffs 3 times a day) until Sunday, May 13 per her insurance. We do not have Combivent samples in the office currently. She is asking for a sample medication to get her through to that date She would like to try Symbicort as she has used it in the past.She states she does not have an allergy. She states she did not have throat swelling and insists  she wants to use Symbicort over any of the other medications we offered her today. ( I offered Incruse, Stialto). She states she has used this in the past without any problems. She denies fever, chest pain, purulent secretions or cough. She denies orthopnea or hemoptysis, leg swelling or leg pain.  Test Results:  CBC Latest Ref Rng & Units 02/05/2017 06/23/2010 12/22/2009  WBC 4.0 - 10.5 K/uL 3.4(L) 5.9 5.5  Hemoglobin 12.0 - 15.0 g/dL 13.9 14.2 14.3  Hematocrit 36.0 - 46.0 % 41.6 43.4 45.2  Platelets 150.0 - 400.0 K/uL 260.0 308 333    BMP Latest Ref Rng & Units 02/05/2017  01/25/2015 04/13/2014  Glucose 70 - 99 mg/dL 100(H) 101(H) -  BUN 6 - 23 mg/dL 9 8 -  Creatinine 0.40 - 1.20 mg/dL 0.76 0.84 0.71  Sodium 135 - 145 mEq/L 137 140 -  Potassium 3.5 - 5.1 mEq/L 4.5 4.3 -  Chloride 96 - 112 mEq/L 99 104 -  CO2 19 - 32 mEq/L 34(H) 30 -  Calcium 8.4 - 10.5 mg/dL 10.0 10.4 -     PFT    Component Value Date/Time   FEV1PRE 0.84 01/05/2016 0907   FVCPRE 1.36 01/05/2016 0907   PREFEV1FVCRT 61 01/05/2016 0907    Ct Abdomen Pelvis W Contrast  Result Date: 02/07/2017 CLINICAL DATA:  Epigastric and left upper quadrant pain. Intermittent bloating and diarrhea alternating with constipation. 30 pound weight loss. EXAM: CT ABDOMEN AND PELVIS WITH CONTRAST TECHNIQUE: Multidetector CT imaging of the abdomen and pelvis was performed using the standard protocol following bolus administration of intravenous contrast. CONTRAST:  170mL ISOVUE-300 IOPAMIDOL (ISOVUE-300) INJECTION 61% COMPARISON:  01/25/2015 FINDINGS: Lower chest: Centrilobular emphysema and mild atelectasis are noted in the lung bases. There is a small fat containing Bochdalek's hernia on the left. Hepatobiliary: Numerous low-density lesions throughout the liver have not significantly changed in size. Larger lesions measure up to 1.7 cm in size and are compatible with cysts, while there are numerous subcentimeter lesions which are  too small to fully characterize. Unremarkable gallbladder. No biliary dilatation. Pancreas: No evidence of pancreatic mass or inflammation. Minimal prominence of the pancreatic duct in the head and neck is unchanged. Spleen: Unremarkable. Adrenals/Urinary Tract: Minimal left greater than right adrenal gland thickening without mass. No evidence of renal calculi or hydronephrosis. subcentimeter low-density renal lesions are present bilaterally, too small to fully characterize but most likely cysts. The 1.8 cm left renal cyst on the prior study has decreased in size, now 1.2 cm. Unremarkable  bladder. Stomach/Bowel: The stomach is within normal limits. A large amount of stool is again seen in the colon. There is fecalization of the terminal ileum suggesting slow transit with borderline dilatation of the distal ileum. No gross bowel inflammatory changes are identified, though evaluation is somewhat limited by the paucity of intra- abdominal fat. Scattered sigmoid colon diverticula are noted. The appendix is not well seen. Vascular/Lymphatic: Abdominal aortic atherosclerosis without aneurysm. No enlarged lymph nodes. Reproductive: Uterus and bilateral adnexa are unremarkable. Other: No intraperitoneal free fluid. No abdominal wall mass or hernia. Musculoskeletal: No acute osseous abnormality or suspicious osseous lesion. IMPRESSION: 1. No acute abnormality identified in the abdomen or pelvis. 2. Persistent large volume stool throughout the colon suggesting constipation with evidence of slow transit in the distal ileum. 3. Unchanged hepatic cysts. 4. Aortic atherosclerosis. Electronically Signed   By: Logan Bores M.D.   On: 02/07/2017 16:27   Mm Digital Diagnostic Unilat L  Result Date: 02/28/2017 CLINICAL DATA:  Callback from screening mammogram for left breast calcifications EXAM: DIGITAL DIAGNOSTIC LEFT MAMMOGRAM COMPARISON:  Previous exam(s). ACR Breast Density Category d: The breast tissue is extremely dense, which lowers the sensitivity of mammography. FINDINGS: Magnification views of the left breast demonstrate an at least 12 mm group of pleomorphic calcifications within the lower, outer left breast posterior depth. The calcifications abut the posterior aspect of the image and may extend further posteriorly. IMPRESSION: Indeterminate left breast calcifications. RECOMMENDATION: Surgical consultation for excisional biopsy. The patient is not a candidate for stereotactic biopsy due to a breast compression thickness of less than 1 cm. The patient has an appointment to see Dr. Marlou Starks on Mar 19, 2017. I  have discussed the findings and recommendations with the patient. Results were also provided in writing at the conclusion of the visit. If applicable, a reminder letter will be sent to the patient regarding the next appointment. BI-RADS CATEGORY  4: Suspicious. Electronically Signed   By: Pamelia Hoit M.D.   On: 02/28/2017 09:07   Mm Screening Breast Tomo Bilateral  Result Date: 02/21/2017 CLINICAL DATA:  Screening. EXAM: 2D DIGITAL SCREENING BILATERAL MAMMOGRAM WITH CAD AND ADJUNCT TOMO COMPARISON:  Previous exam(s). ACR Breast Density Category d: The breast tissue is extremely dense, which lowers the sensitivity of mammography. FINDINGS: In the left breast, calcifications warrant further evaluation. In the right breast, no findings suspicious for malignancy. Images were processed with CAD. IMPRESSION: Further evaluation is suggested for calcifications in the left breast. RECOMMENDATION: Diagnostic mammogram of the left breast. (Code:[id number]) The patient will be contacted regarding the findings, and additional imaging will be scheduled. BI-RADS CATEGORY  0: Incomplete. Need additional imaging evaluation and/or prior mammograms for comparison. Electronically Signed   By: Lajean Manes M.D.   On: 02/21/2017 09:46     Past medical hx Past Medical History:  Diagnosis Date  . Acute sinusitis, unspecified   . Cervicalgia   . Chest pain, unspecified   . COPD (chronic obstructive pulmonary disease) ([place])   .  Degeneration of cervical intervertebral disc   . Diverticulosis of colon (without mention of hemorrhage)   . Esophageal reflux   . Hyperlipidemia   . IBS (irritable bowel syndrome)   . 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 Obstructive chronic bronchitis with exacerbation ([place])   . Personal history of peptic ulcer disease   . Unspecified essential hypertension   . Unspecified hypothyroidism   . Unspecified vitamin D deficiency   . Vitiligo      Social History  Substance Use Topics  . Smoking  status: Current Some Day Smoker    Packs/day: 0.50    Years: 50.00    Types: Cigarettes  . Smokeless tobacco: Never Used     Comment: 3 cigarettes a day  . Alcohol use No    Tobacco Cessation: Patient is a current every day smoker. I have spent 4 minutes counseling patient on smoking cessation, and consequences of continued tobacco abuse this visit.  Past surgical hx, Family hx, Social hx all reviewed.  Current Outpatient Prescriptions on File Prior to Visit  Medication Sig  . albuterol (PROVENTIL) (2.5 MG/3ML) 0.083% nebulizer solution Take 3 mLs (2.5 mg total) by nebulization every 4 (four) hours as needed for wheezing or shortness of breath.  . hydrochlorothiazide (HYDRODIURIL) 25 MG tablet TAKE ONE TABLET BY MOUTH ONCE DAILY  . Ipratropium-Albuterol (COMBIVENT) 20-100 MCG/ACT AERS respimat Inhale 1 puff into the lungs 4 (four) times daily.  Marland Kitchen losartan (COZAAR) 50 MG tablet TAKE ONE TABLET BY MOUTH  DAILY  . nizatidine (AXID) 15 MG/ML SOLN Take 10 mLs (150 mg total) by mouth 2 (two) times daily.  . rosuvastatin (CRESTOR) 5 MG tablet TAKE ONE TABLET BY MOUTH ONCE DAILY  . SYNTHROID 88 MCG tablet Take 1 tablet (88 mcg total) by mouth daily before breakfast.   No current facility-administered medications on file prior to visit.      No Known Allergies  Review Of Systems:  Constitutional:   No  weight loss, night sweats,  Fevers, chills, fatigue, or  lassitude.  HEENT:   No headaches,  Difficulty swallowing,  Tooth/dental problems, or  Sore throat,                No sneezing, itching, ear ache, nasal congestion, post nasal drip,   CV:  No chest pain,  Orthopnea, PND, swelling in lower extremities, anasarca, dizziness, palpitations, syncope.   GI  No heartburn, indigestion, abdominal pain, nausea, vomiting, diarrhea, change in bowel habits, loss of appetite, bloody stools.   Resp: + shortness of breath with exertion less at rest.  No excess mucus, no productive cough,  No  non-productive cough,  No coughing up of blood.  No change in color of mucus.  No wheezing.  No chest wall deformity  Skin: no rash or lesions.  GU: no dysuria, change in color of urine, no urgency or frequency.  No flank pain, no hematuria   MS:  No joint pain or swelling.  No decreased range of motion.  No back pain.  Psych:  No change in mood or affect. No depression or +anxiety.  No memory loss.   Vital Signs BP 122/64 (BP Location: Right Arm, Cuff Size: Normal)   Pulse (!) 57   Temp 97.6 F (36.4 C) (Oral)   Ht 5' 6.25" (1.683 m)   Wt 92 lb 3.2 oz (41.8 kg)   SpO2 95%   BMI 14.77 kg/m    Body mass index is 14.77 kg/m.  Physical Exam:  General- No distress,  A&Ox3, anxious appearing very thin female ENT: No sinus tenderness, TM clear, pale nasal mucosa, no oral exudate,no post nasal drip, no LAN, severe oral thrush Cardiac: S1, S2, regular rate and rhythm, no murmur Chest: No wheeze/ rales/ dullness; no accessory muscle use, no nasal flaring, no sternal retractions Abd.: Soft Non-tender, flat Ext: No clubbing cyanosis, edema Neuro:  Deconditioned at baseline Skin: No rashes, warm and dry Psych: Anxious,  flat affect   Assessment/Plan  COPD GOLD III Complaints of shortness of breath, but no wheezing on exam today Significant weight loss, secondary to GI issues per patient Plan Magic Mouthwash 5 cc's swish and swallow 4 times daily for 1 week. Please add Boost supplements to your diet if possible for weight gain. Zantac 150 mg once daily until you see Dr. Henrene Pastor Symbicort 2 puffs twice daily only until Combivent is refilled this Sunday.. If you have any reaction to this medication stop using it and call us immediately. Then restart Combivent 1 puff four times daily Sunday when you are able to get it from the pharmacy. Remember to rinse after use. Continues to use your albuterol for rescue as needed up to every 6 hours Follow up with Dr. Lamonte Sakai on 6/5 as is  scheduled. Please contact office for sooner follow up if symptoms do not improve or worsen or seek emergency care   I will have patient come in prior to her appointment with Dr. Lamonte Sakai on 654 chest x-ray. If patient continues to have shortness of breath, without wheezing on exam at next visit was no explanation on chest x-ray consider CT chest.  Magdalen Spatz, NP 03/04/2017  5:11 PM

## 2017-03-04 NOTE — Telephone Encounter (Signed)
Please call patient and have her come in  for a chest x-ray prior to appointment with Dr. Lamonte Sakai in June. Please place order for chest x-ray. Thank you

## 2017-03-04 NOTE — Patient Instructions (Addendum)
It is nice to meet you today. Magic Mouthwash 5 cc's 4 times daily for 1 week. Please add Boost supplements to your diet if possible. Zantac 150 mg once daily until you see Dr. Henrene Pastor Symbicort 2 puffs twice daily only until Combivent is refilled this Sunday.. If you have any reaction to this medication stop using it and call us immediately. Then restart Combivent 1 puff four times daily Sunday when you are able to get it from the pharmacy. Remember to rinse after use. Follow up with Dr. Lamonte Sakai on 6/5 as is scheduled. Please contact office for sooner follow up if symptoms do not improve or worsen or seek emergency care

## 2017-03-04 NOTE — Assessment & Plan Note (Addendum)
Complaints of shortness of breath, but no wheezing on exam today Significant weight loss, secondary to GI issues per patient Plan Magic Mouthwash 5 cc's swish and swallow 4 times daily for 1 week. Please add Boost supplements to your diet if possible for weight gain. Zantac 150 mg once daily until you see Dr. Henrene Pastor Symbicort 2 puffs twice daily only until Combivent is refilled this Sunday.. If you have any reaction to this medication stop using it and call us immediately. Then restart Combivent 1 puff four times daily Sunday when you are able to get it from the pharmacy. Remember to rinse after use. Continues to use your albuterol for rescue as needed up to every 6 hours Follow up with Dr. Lamonte Sakai on 6/5 as is scheduled. Please contact office for sooner follow up if symptoms do not improve or worsen or seek emergency care

## 2017-03-05 NOTE — Telephone Encounter (Signed)
Spoke with pt, advised message and pt will come in for xray. Order placed. Pt verbalized understanding and nothing further is needed.

## 2017-03-07 ENCOUNTER — Encounter: Payer: Self-pay | Admitting: Internal Medicine

## 2017-03-11 ENCOUNTER — Ambulatory Visit (INDEPENDENT_AMBULATORY_CARE_PROVIDER_SITE_OTHER)
Admission: RE | Admit: 2017-03-11 | Discharge: 2017-03-11 | Disposition: A | Discharge status: Home / Self Care | Payer: Medicare HMO | Source: Ambulatory Visit | Attending: Emergency Medicine | Admitting: Emergency Medicine

## 2017-03-11 DIAGNOSIS — J449 Chronic obstructive pulmonary disease, unspecified: Secondary | ICD-10-CM | POA: Diagnosis not present

## 2017-03-11 DIAGNOSIS — J439 Emphysema, unspecified: Secondary | ICD-10-CM | POA: Diagnosis not present

## 2017-03-12 ENCOUNTER — Other Ambulatory Visit: Payer: Self-pay | Admitting: Family

## 2017-03-18 DIAGNOSIS — J449 Chronic obstructive pulmonary disease, unspecified: Secondary | ICD-10-CM | POA: Diagnosis not present

## 2017-03-19 DIAGNOSIS — R921 Mammographic calcification found on diagnostic imaging of breast: Secondary | ICD-10-CM | POA: Diagnosis not present

## 2017-03-26 ENCOUNTER — Encounter: Payer: Self-pay | Admitting: Neurology

## 2017-03-26 ENCOUNTER — Ambulatory Visit (INDEPENDENT_AMBULATORY_CARE_PROVIDER_SITE_OTHER): Payer: Medicare HMO | Admitting: Neurology

## 2017-03-26 VITALS — BP 100/60 | HR 101 | Ht 66.25 in | Wt 90.3 lb

## 2017-03-26 DIAGNOSIS — G992 Myelopathy in diseases classified elsewhere: Secondary | ICD-10-CM

## 2017-03-26 DIAGNOSIS — E60 Dietary zinc deficiency: Secondary | ICD-10-CM | POA: Diagnosis not present

## 2017-03-26 DIAGNOSIS — G1220 Motor neuron disease, unspecified: Secondary | ICD-10-CM

## 2017-03-26 DIAGNOSIS — G959 Disease of spinal cord, unspecified: Secondary | ICD-10-CM

## 2017-03-26 DIAGNOSIS — E46 Unspecified protein-calorie malnutrition: Secondary | ICD-10-CM | POA: Diagnosis not present

## 2017-03-26 NOTE — Patient Instructions (Addendum)
Start zinc 50mg  daily Start copper 8mg  x 1 week, 6mg  x 1, then 4mg  x 1 week, then 2mg  thereafter  Ask the pharmacist for liquid formulation of copper and zinc supplements  Return to clinic in 2-3 months

## 2017-03-26 NOTE — Progress Notes (Signed)
Follow-up Visit   Date: 03/26/17    Kristin [name] MRN: [id number] DOB: [birth year]   Interim History: Kristin [name] is a 71 y.o. 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  She was last seen in April 2016.   History of present illness: Starting around 2011, she developed gradual and progressive difficulty swallowing with associated weight loss. She transitioned to a soft food diet and eats yogurt, ice cream, and soups. She tries to drink ensure, but developed abdominal pain. She has mild weakness of the left arm because she is unable to lift heavy objects as well as previously. She denies any slurring of speech, muscle twitches, or cramps. She endorses shortness of breath with exertion. She sleeps on 3-pillows since 2013.   She has previously been evaluated by ENT and GI with negative workup. 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 Modified barium swallow was ordered, however patient was unable to complete the study due to intolerance of testing and inability to swallow.   She was seen by my colleague, Dr. Delice Lesch, who ordered electrodiagnostic testing and labs. 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 She was referred to me for ongoing management in June 2015.  As part of her motor neuron disease work-up, labs were checked and showed markedly low copper and zinc levels. However, due to GI intolerance, she did not take this.  We discussed role of PEG for nutrition, but she did not want this unless as a last option.    She  continue to have problems with dysphagia and is still eating tubs of icecream.  Denies any new weakness, muscle twitches, spasms, hospitalizations, or falls.  She is staying active with hobbies at home.  She is not interested in PEG.  - UPDATE 03/26/2017:  She returned for follow-up at the recommendation of her surgeon, Dr. Marlou Starks, because of ongoing weight loss and weakness.  She was last seen in April 2016 and recommended to take zinc and copper supplements to help differentiate between vitamin deficiency causing myeloneuropathy vs. ALS; however, she was lost to follow-up and did not take supplements.    Recently, as part of her breast cancer screening, she was found to have left breast calcification and was recommended to have a biopsy by general surgery.  She did not wish to have surgery because she does not tolerate anesthesia.  Since her last visit, she has lost 19lb and has greater difficulty finding soft foods that she can tolerate.  She has been sleeping in a recliner for the past few weeks because of shortness of breath and GERD.  She also complains of cramps in the hands and feet, but denies any muscle twitches. She denies speech changes, weakness of the arms, or legs. She walks unassisted and has not had any falls or interval hospitalizations.     Medications:  Current Outpatient Prescriptions on File Prior to Visit  Medication Sig Dispense Refill  . albuterol (PROVENTIL) (2.5 MG/3ML) 0.083% nebulizer solution Take 3 mLs (2.5 mg total) by nebulization every 4 (four) hours as needed for wheezing or shortness of breath. 75 mL 12  . budesonide-formoterol (SYMBICORT) 160-4.5  MCG/ACT inhaler Inhale 2 puffs into the lungs 2 (two) times daily. 1 Inhaler 0  . Diphenhyd-Hydrocort-Nystatin (FIRST-DUKES MOUTHWASH) SUSP Use as directed 5 mLs in the mouth or throat 4 (four) times daily. 140 mL 0  . hydrochlorothiazide (HYDRODIURIL) 25 MG tablet TAKE ONE TABLET BY MOUTH ONCE DAILY 90 tablet 1  .  Ipratropium-Albuterol (COMBIVENT) 20-100 MCG/ACT AERS respimat Inhale 1 puff into the lungs 4 (four) times daily. 1 Inhaler 5  . losartan (COZAAR) 50 MG tablet TAKE ONE TABLET BY MOUTH  DAILY 90 tablet 1  . nizatidine (AXID) 15 MG/ML SOLN Take 10 mLs (150 mg total) by mouth 2 (two) times daily. 600 mL 1  . ranitidine (ZANTAC) 150 MG tablet Take 1 tablet (150 mg total) by mouth at bedtime. 30 tablet 1  . rosuvastatin (CRESTOR) 5 MG tablet TAKE ONE TABLET BY MOUTH ONCE DAILY 90 tablet 1  . SYNTHROID 88 MCG tablet Take 1 tablet (88 mcg total) by mouth daily before breakfast. 90 tablet 1  . SYNTHROID 88 MCG tablet TAKE 1 TABLET BY MOUTH ONCE DAILY BEFORE BREAKFAST 30 tablet 0   No current facility-administered medications on file prior to visit.     Allergies:  Allergies  Allergen Reactions  . Symbicort [Budesonide-Formoterol Fumarate] Other (See Comments)    Throat swelling Throat swelling     Review of Systems:  CONSTITUTIONAL: No fevers, chills, night sweats, + 20lb weight loss.   EYES: No visual changes or eye pain ENT: No hearing changes.  No history of nose bleeds.   RESPIRATORY: No cough, wheezing +shortness of breath.   CARDIOVASCULAR: Negative for chest pain, and palpitations.   GI: Negative for abdominal discomfort, blood in stools or black stools.  No recent change in bowel habits.   GU:  No history of incontinence.   MUSCLOSKELETAL: No history of joint pain or swelling.  No myalgias.   SKIN: Negative for lesions, rash, and itching.   ENDOCRINE: Negative for cold or heat intolerance, polydipsia or goiter.   PSYCH:  No depression or anxiety symptoms.   NEURO: As Above.   Vital Signs:  BP 100/60   Pulse (!) 101   Ht 5' 6.25" (1.683 m)   Wt 90 lb 5 oz (41 kg)   SpO2 (!) 87%   BMI 14.47 kg/m   Neurological Exam: MENTAL STATUS including orientation to time, place, person is normal.  Speech is not mildly dysarthric with lingual sounds.   CRANIAL NERVES:  Pupils equal  round and reactive to light.  Normal conjugate, extra-ocular eye movements in all directions of gaze.  No ptosis. Face is symmetric and facial muscles are intact.  Tongue strength is normal. Snout is present (L>R).  No tongue fasciculations.  MOTOR: Muscle bulk is markedly reduced throughout. No fasciculations.  Neck flexion is 5-/5 Right Upper Extremity:    Left Upper Extremity:    Deltoid  5/5   Deltoid  5/5   Biceps  5/5   Biceps  5/5   Triceps  5/5   Triceps  5-/5   Medial pectoralis  5-/5   Medial pectoralis  4+/5   Infraspinatus  4+/5   Infraspinatus  4/5   Wrist extensors  5/5   Wrist extensors  5/5   Wrist flexors  5/5   Wrist flexors  5/5   Finger extensors  5/5   Finger extensors  5/5   Finger flexors  5/5   Finger flexors  5/5   Dorsal interossei  5/5  Dorsal interossei  5/5   Abductor pollicis  5/5   Abductor pollicis  5/5   Tone (Ashworth scale)  0   Tone (Ashworth scale)  0    Right Lower Extremity:    Left Lower Extremity:    Hip flexors  5-/5   Hip flexors  5-/5   Hip extensors  5/5   Hip extensors  5/5   Knee flexors  5/5   Knee flexors  5/5   Knee extensors  5/5   Knee extensors  5/5   Dorsiflexors  4+/5   Dorsiflexors  4+/5   Plantarflexors  5/5   Plantarflexors  5/5   Toe extensors  5/5   Toe extensors  5/5   Toe flexors  4+/5   Toe flexors  4+/5   Tone (Ashworth scale)  0   Tone (Ashworth scale)  0    MSRs:  Right      Left  brachioradialis  3+   brachioradialis  3+   biceps  3+   biceps  3+   triceps  3+   triceps  2+   patellar  2+   patellar  3+   ankle jerk  1+   ankle jerk  1+   Hoffman  no   Hoffman  no   plantar response  down   plantar response  down    GAIT:  She is able to stand from low chair without pushing off with arms. Gait narrow-based and stable.  Data: MRI brain and cervical spine 04/13/2014: Mild chronic microvascular ischemic change in the white matter. No acute intracranial abnormality.  Moderate to advanced cervical spondylosis.  Central disc protrusions at C3-4 and C4-5 and C5-6 causing spinal stenosis and foraminal encroachment. This is most severe at C3-4 and C4-5.   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   Labs 04/02/19/15: CK 240*, ANA positive (1:40), CRP <0.5, ESR 7, SCL 70 - negative, SSA/B negative, RF negative Labs 04/20/2014:  GM1 antibody neg, SPEP/UPEP with IFE no M protein, vitamin B12 287, copper 5*, zinc 43*   IMPRESSION: Mrs. 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  She was last seen in 2016 and there is interval weakness of the lower extremities and mild dysarthria.  I do not see any fasciculations.  Based on her pulmonary function studies, her dyspnea is more obstructive than restrictive, as would be seen in a primary neuromuscular condition. 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  We discussed the alternative diagnosis of ALS, and how this is a diagnose of exclusion; therefore, without correcting her mineral deficiency, diagnostic certainty is not possible.    She has tried taking zinc and copper supplements, but stopped them because if GI upset. I recommended that she try to start zinc 36m and copper 864mwith liquid formulation which may be easier for her to tolerate.  I also discussed that it is reasonable to repeat NCS/EMG of the left side to assess for evolution of motor neuron disease, but she declined additional testing.   I readdressed her wishes for PEG tube placement for nutrition.  She will think about this, and  is not interested at this time.  In the meantime, I suggested that she supplement meals with Boost or Ensure.   Regarding her left breast excisional biopsy, patient does not feel medically fit to undergo any type of surgery/anesthesia.    Return to clinic in 3 month   The duration of this appointment visit was 40 minutes of face-to-face time with the patient.  Greater than 50% of this time was spent in counseling, explanation of diagnosis, planning of further management, and coordination of care.   Thank you for allowing me to participate in patient's care.  If I can answer any additional questions, I would be pleased to do so.    Sincerely,    Donika K. Posey Pronto, DO

## 2017-04-02 ENCOUNTER — Other Ambulatory Visit (INDEPENDENT_AMBULATORY_CARE_PROVIDER_SITE_OTHER): Payer: Medicare HMO

## 2017-04-02 ENCOUNTER — Ambulatory Visit (INDEPENDENT_AMBULATORY_CARE_PROVIDER_SITE_OTHER): Payer: Medicare HMO | Admitting: Emergency Medicine

## 2017-04-02 ENCOUNTER — Encounter: Payer: Self-pay | Admitting: Emergency Medicine

## 2017-04-02 DIAGNOSIS — R911 Solitary pulmonary nodule: Secondary | ICD-10-CM

## 2017-04-02 DIAGNOSIS — J449 Chronic obstructive pulmonary disease, unspecified: Secondary | ICD-10-CM

## 2017-04-02 LAB — BASIC METABOLIC PANEL
BUN: 8 mg/dL (ref 6–23)
CALCIUM: 9.8 mg/dL (ref 8.4–10.5)
CO2: 34 mEq/L — ABNORMAL HIGH (ref 19–32)
Chloride: 97 mEq/L (ref 96–112)
Creatinine, Ser: 0.8 mg/dL (ref 0.40–1.20)
GFR: 91.44 mL/min (ref >60.00)
GLUCOSE: 100 mg/dL — AB (ref 70–99)
POTASSIUM: 3.5 meq/L (ref 3.5–5.1)
Sodium: 138 mEq/L (ref 135–145)

## 2017-04-02 MED ORDER — TIOTROPIUM BROMIDE-OLODATEROL 2.5-2.5 MCG/ACT IN AERS: 2.0000 | INHALATION_SPRAY | Freq: Every day | RESPIRATORY_TRACT | 0 refills | Status: DC

## 2017-04-02 NOTE — Assessment & Plan Note (Signed)
Suspected pulmonary nodule seen on chest x-ray from 03/11/17. She needs a CT scan of the chest to further evaluate. I reviewed the report from a CT May 2014 and there are no pulmonary nodules noted on that study. Follow-up after the scan to review.

## 2017-04-02 NOTE — Addendum Note (Signed)
Addended by: Benson Setting L on: 04/02/2017 10:10 AM   Modules accepted: Orders

## 2017-04-02 NOTE — Progress Notes (Signed)
Subjective:    Patient ID: Kristin [name], female    DOB: 1946-02-24  MRN: [id number]   Brief patient profile:  52 yobf smoker with emphysema by ct referred by Dr Kristin [name] 03/20/2013 to pulmonary clinic.  ROV 01/05/16 -- new patient visit for RB. 71 year old woman, current smoker (30 pack years), with a history of COPD, chronic rhinitis, chronic cough, GERD, irritable bowel syndrome. She has been followed by Dr Kristin [name], underwent pulmonary function testing in July 2014 that showed severe obstructive lung disease with an FEV1 of 25 L, 34% predicted, a positive bronchodilator response. She was started on Symbicort in December 2016 but had stopped this medication due to upper airway irritation and the feeling that her "throat was closing". It has been added to her allergy list. She was started instead on Combivent which she takes prn. She underwent spirometry today that I have personally reviewed. This showed no change in her FEV1, currently 0.84 L or 40% predicted. She describes UA irritation, occasional VC closure, dysphagia that can prevent swallowing. Being evaluated by Dr Kristin [name] - no evidence of obstruction. She has allergies, not on meds and would like to avoid if possible. She feels that her inability to eat not outweighs her other problems.   ROV 04/17/16 -- follow-up visit for COPD, continued tobacco use, severe obstruction on spirometry. Also with some symptoms that are reminiscent of upper airway irritation and possible vocal cord dysfunction. She also has symptoms consistent with GERD and possible dysphagia. I started pantoprazole 40 mg last visit. I also changed her Combivent to 3 times a day scheduled. Her scleroderma antibody was negative. She feels that the scheduled combivent has been beneficial. She still has marginal PO - having difficulty w reflux. She didn't start pantoprazole > unable to crush, couldn';t get the liquid version.  Smokes about 1-2 cig a day.   ROV 10/15/16 -- Follow-up visit for  patient with a history of tobacco use, COPD, chronic cough with chronic upper airway irritation syndrome in the setting of chronic rhinitis and GERD. She also has irritable bowel syndrome. At her last visit in June we continued Combivent. She was given prevacid in the past - unable to swallow the pill, trying to get liquid. 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 She tells me that she was tried on Anoro back in September but she never took it, was concerned that she didn't know the side effects. She tells me today that she doesn't want to try it. She denies any cough or wheeze, no mucous. Continues to smoker about 2-3 cig a day.   ROV 04/03/27 --  71 year old woman, active smoker with a history of COPD, chronic cough with chronic upper airway irritation syndrome. Also with a history of irritable bowel syndrome. She was seen approximate one month ago with increased abdominal discomfort and what appear to be associated worsening dyspnea. She had run out of her Combivent at that time. She was given Symbicort temporarily until her Combivent to be refilled. She returns now for follow-up. Chest x-ray on 5/14 showed severe bullous emphysema without infiltrates. There was a suspected nodular density projecting in the left lower lobe of unclear etiology and a CT chest was recommended. She is back on combivent 1 puff, now qid. Her breathing is worst at night, difficulty laying flat. Her IBS affects her breathing. Smokes about 1 cig a day. Uses albuterol rarely as long as she has her combivent.      Objective:  Vitals:   04/02/17 0856  BP: 96/60  Pulse: 90  SpO2: 91%  Weight: 90 lb (40.8 kg)  Height: 5\' 6"  (1.676 m)   Gen: Pleasant, thin woman, in no distress,  normal affect  ENT: No lesions,  mouth clear,  oropharynx clear, no postnasal drip  Neck: No JVD, no TMG, no carotid bruits  Lungs: No use of accessory muscles, Very distant, clear without rales or rhonchi  Cardiovascular: RRR,  heart sounds normal, no murmur or gallops, no peripheral edema  Musculoskeletal: No deformities, no cyanosis or clubbing  Neuro: alert, non focal  Skin: Warm, some tightness that is suggestive of possible scleroderma.     Assessment & Plan:  COPD GOLD D Severe COPD with emphysematous component. Exertional dyspnea. Continues to smoke. Need to evaluate today whether she desaturates with exertion. Her Combivent runs out before the end of the month and I would like to change her to an alternative. We will try Stiolto.   We will temporarily stop Combivent Walking oximetry today on room air. Start Stiolto 2 puffs once a day for 3-4 weeks to see if you benefit from it. If so we will order through your pharmacy.  Take albuterol 2 puffs up to every 4 hours if needed for shortness of breath.  Work on stopping smoking completely Follow with Dr Kristin [name] next available after the CT scan to review.   Pulmonary nodule Suspected pulmonary nodule seen on chest x-ray from 03/11/17. She needs a CT scan of the chest to further evaluate. I reviewed the report from a CT May 2014 and there are no pulmonary nodules noted on that study. Follow-up after the scan to review.  Kristin Apo, MD, PhD 04/02/2017, 9:24 AM [facility] [phone] or if no answer [phone]

## 2017-04-02 NOTE — Patient Instructions (Addendum)
We will perform a CT scan of the chest with contrast to evaluate for a pulmonary nodule.  We will temporarily stop Combivent Walking oximetry today on room air. Start Stiolto 2 puffs once a day for 3-4 weeks to see if you benefit from it. If so we will order through your pharmacy.  Take albuterol 2 puffs up to every 4 hours if needed for shortness of breath.  Work on stopping smoking completely Follow with Dr Lamonte Sakai next available after the CT scan to review.

## 2017-04-02 NOTE — Addendum Note (Signed)
Addended by: Benson Setting L on: 04/02/2017 09:59 AM   Modules accepted: Orders

## 2017-04-02 NOTE — Assessment & Plan Note (Signed)
Severe COPD with emphysematous component. Exertional dyspnea. Continues to smoke. Need to evaluate today whether she desaturates with exertion. Her Combivent runs out before the end of the month and I would like to change her to an alternative. We will try Stiolto.   We will temporarily stop Combivent Walking oximetry today on room air. Start Stiolto 2 puffs once a day for 3-4 weeks to see if you benefit from it. If so we will order through your pharmacy.  Take albuterol 2 puffs up to every 4 hours if needed for shortness of breath.  Work on stopping smoking completely Follow with Dr Lamonte Sakai next available after the CT scan to review.

## 2017-04-02 NOTE — Addendum Note (Signed)
Addended by: Benson Setting L on: 04/02/2017 09:36 AM   Modules accepted: Orders

## 2017-04-05 ENCOUNTER — Ambulatory Visit (INDEPENDENT_AMBULATORY_CARE_PROVIDER_SITE_OTHER)
Admission: RE | Admit: 2017-04-05 | Discharge: 2017-04-05 | Disposition: A | Discharge status: Home / Self Care | Payer: Medicare HMO | Source: Ambulatory Visit | Attending: Emergency Medicine | Admitting: Emergency Medicine

## 2017-04-05 DIAGNOSIS — R911 Solitary pulmonary nodule: Secondary | ICD-10-CM

## 2017-04-05 MED ORDER — IOPAMIDOL (ISOVUE-300) INJECTION 61%: 75.0000 mL | Freq: Once | INTRAVENOUS | Status: DC | PRN

## 2017-04-09 ENCOUNTER — Other Ambulatory Visit: Payer: Self-pay | Admitting: Family

## 2017-04-17 ENCOUNTER — Other Ambulatory Visit: Payer: Self-pay | Admitting: *Deleted

## 2017-04-17 ENCOUNTER — Telehealth: Payer: Self-pay | Admitting: Neurology

## 2017-04-17 DIAGNOSIS — R69 Illness, unspecified: Secondary | ICD-10-CM | POA: Diagnosis not present

## 2017-04-17 MED ORDER — AMBULATORY NON FORMULARY MEDICATION: 1.0000 [IU] | Freq: Every day | 0 refills | Status: DC

## 2017-04-17 NOTE — Telephone Encounter (Signed)
Patient wants to know if a rx  could be written for a power left chair. She is not sure if medicare would even pay for it or not please call her

## 2017-04-18 DIAGNOSIS — J449 Chronic obstructive pulmonary disease, unspecified: Secondary | ICD-10-CM | POA: Diagnosis not present

## 2017-04-18 NOTE — Telephone Encounter (Signed)
Attempted to contact patient to let her know that I have Rx for lift chair ready for her.  No answer and no voicemail.

## 2017-04-18 NOTE — Telephone Encounter (Signed)
Attempted to contact patient again.  No answer and no voicemail. I will mail the Rx for the lift chair to her.

## 2017-04-24 ENCOUNTER — Telehealth: Payer: Self-pay | Admitting: Neurology

## 2017-04-24 NOTE — Telephone Encounter (Signed)
Patient needs someone to call [facility] and do claim for a lift chair this is the number she gave [place] to call [phone]

## 2017-04-25 NOTE — Telephone Encounter (Signed)
All information faxed to [facility] at [phone]. Case #[id number] [place]

## 2017-05-06 ENCOUNTER — Telehealth: Payer: Self-pay | Admitting: Emergency Medicine

## 2017-05-06 MED ORDER — TIOTROPIUM BROMIDE-OLODATEROL 2.5-2.5 MCG/ACT IN AERS: 2.0000 | INHALATION_SPRAY | Freq: Every day | RESPIRATORY_TRACT | 5 refills | Status: DC

## 2017-05-06 NOTE — Telephone Encounter (Signed)
Spoke with patient. She stated that the Stiolto inhaler prescriped by RB is working well and she would like a RX to be sent in. Pt uses [facility] on [address].   RX has been sent in. Patient is aware.

## 2017-05-09 ENCOUNTER — Other Ambulatory Visit: Payer: Self-pay | Admitting: Family

## 2017-05-09 DIAGNOSIS — I10 Essential (primary) hypertension: Secondary | ICD-10-CM

## 2017-05-18 DIAGNOSIS — J449 Chronic obstructive pulmonary disease, unspecified: Secondary | ICD-10-CM | POA: Diagnosis not present

## 2017-05-30 ENCOUNTER — Telehealth: Payer: Self-pay | Admitting: Emergency Medicine

## 2017-05-30 NOTE — Telephone Encounter (Signed)
Spoke with pt. She is needing samples of Stiolto. Samples have been left at the front desk. Nothing further was needed.

## 2017-05-31 ENCOUNTER — Other Ambulatory Visit (INDEPENDENT_AMBULATORY_CARE_PROVIDER_SITE_OTHER): Payer: Medicare HMO

## 2017-05-31 ENCOUNTER — Ambulatory Visit (INDEPENDENT_AMBULATORY_CARE_PROVIDER_SITE_OTHER): Payer: Medicare HMO | Admitting: Family

## 2017-05-31 ENCOUNTER — Encounter: Payer: Self-pay | Admitting: Family

## 2017-05-31 VITALS — BP 100/68 | HR 70 | Temp 97.5°F | Resp 12 | Ht 66.0 in | Wt 90.0 lb

## 2017-05-31 DIAGNOSIS — E039 Hypothyroidism, unspecified: Secondary | ICD-10-CM

## 2017-05-31 DIAGNOSIS — E119 Type 2 diabetes mellitus without complications: Secondary | ICD-10-CM | POA: Diagnosis not present

## 2017-05-31 DIAGNOSIS — I10 Essential (primary) hypertension: Secondary | ICD-10-CM

## 2017-05-31 DIAGNOSIS — Z0000 Encounter for general adult medical examination without abnormal findings: Secondary | ICD-10-CM

## 2017-05-31 DIAGNOSIS — E2839 Other primary ovarian failure: Secondary | ICD-10-CM | POA: Diagnosis not present

## 2017-05-31 DIAGNOSIS — E782 Mixed hyperlipidemia: Secondary | ICD-10-CM | POA: Diagnosis not present

## 2017-05-31 DIAGNOSIS — K219 Gastro-esophageal reflux disease without esophagitis: Secondary | ICD-10-CM

## 2017-05-31 DIAGNOSIS — Z0001 Encounter for general adult medical examination with abnormal findings: Secondary | ICD-10-CM

## 2017-05-31 DIAGNOSIS — F17200 Nicotine dependence, unspecified, uncomplicated: Secondary | ICD-10-CM

## 2017-05-31 LAB — COMPREHENSIVE METABOLIC PANEL
ALT: 16 U/L (ref 0–35)
AST: 23 U/L (ref 0–37)
Albumin: 4.4 g/dL (ref 3.5–5.2)
Alkaline Phosphatase: 63 U/L (ref 39–117)
BILIRUBIN TOTAL: 1.2 mg/dL (ref 0.2–1.2)
BUN: 5 mg/dL — ABNORMAL LOW (ref 6–23)
CHLORIDE: 96 meq/L (ref 96–112)
CO2: 36 meq/L — AB (ref 19–32)
Calcium: 9.7 mg/dL (ref 8.4–10.5)
Creatinine, Ser: 0.68 mg/dL (ref 0.40–1.20)
GFR: 110.25 mL/min (ref >60.00)
Glucose, Bld: 95 mg/dL (ref 70–99)
Potassium: 3.5 mEq/L (ref 3.5–5.1)
SODIUM: 138 meq/L (ref 135–145)
Total Protein: 7.4 g/dL (ref 6.0–8.3)

## 2017-05-31 LAB — HEMOGLOBIN A1C: Hgb A1c MFr Bld: 6.4 % (ref 4.6–6.5)

## 2017-05-31 LAB — CBC
HCT: 41.2 % (ref 36.0–46.0)
Hemoglobin: 13.8 g/dL (ref 12.0–15.0)
MCHC: 33.5 g/dL (ref 30.0–36.0)
MCV: 96.1 fl (ref 78.0–100.0)
Platelets: 241 10*3/uL (ref 150.0–400.0)
RBC: 4.28 Mil/uL (ref 3.87–5.11)
RDW: 14 % (ref 11.5–15.5)
WBC: 3.5 10*3/uL — AB (ref 4.0–10.5)

## 2017-05-31 LAB — MICROALBUMIN / CREATININE URINE RATIO
CREATININE, U: 84.5 mg/dL
Microalb Creat Ratio: 0.8 mg/g (ref 0.0–30.0)

## 2017-05-31 LAB — TSH: TSH: 2.26 u[IU]/mL (ref 0.35–4.50)

## 2017-05-31 LAB — LIPID PANEL
CHOL/HDL RATIO: 2
Cholesterol: 132 mg/dL (ref 0–200)
HDL: 76.3 mg/dL (ref >39.00)
LDL CALC: 43 mg/dL (ref 0–99)
NonHDL: 55.97
Triglycerides: 63 mg/dL (ref 0.0–149.0)
VLDL: 12.6 mg/dL (ref 0.0–40.0)

## 2017-05-31 NOTE — Assessment & Plan Note (Signed)
1) Anticipatory Guidance: Discussed importance of wearing a seatbelt while driving and not texting while driving; changing batteries in smoke detector at least once annually; wearing suntan lotion when outside; eating a balanced and moderate diet; getting physical activity at least 30 minutes per day.  2) Immunizations / Screenings / Labs:  Declines tetanus. All other immunizations are up-to-date per recommendations. Due for colon cancer screening and scheduled for gastroenterology appointment independently. Obtain DEXA for bone marrow density screening. Due for a vision and dental exam encouraged to be completed independently. All other screenings are up-to-date per recommendations.  Overall well exam with risk factors for cardiovascular disease including hypertension, hyperlipidemia, and type 2 diabetes. Chronic conditions appear adequately managed through medication regimen. She does continue to struggle with eating secondary to abdominal discomforts and difficulty swallowing. Encouraged to eat small, frequent meals that are high in fat and protein. Nutritional supplements as needed. Continue to follow-up with gastroenterology. Advised to discontinue tobacco use to reduce risk for end organ damage in the future. Continue other healthy lifestyle behaviors and choices. Follow-up prevention exam in 1 year. Follow-up office visit for chronic conditions and pending blood work.  Obtain CBC, CMET, and lipid profile.

## 2017-05-31 NOTE — Assessment & Plan Note (Signed)
Stable and maintained on rosuvastatin with no adverse side effects or myalgias. Obtain lipid profile. Continue current dosage of rosuvastatin pending lipid profile results.

## 2017-05-31 NOTE — Assessment & Plan Note (Signed)
Blood pressure well controlled with current medication regimen and no adverse side effects. Continue to monitor blood pressure at home. Continue current dosage of losartan.

## 2017-05-31 NOTE — Progress Notes (Signed)
Subjective:    Patient ID: Kristin [name], female    DOB: Jul 25, 1946, 71 y.o.   MRN: [id number]  Chief Complaint  Patient presents with  . Annual Exam    HPI:  Kristin [name] is a 71 y.o. female who presents today for a Medicare Annual Wellness/Physical exam.    1) Health Maintenance -   Diet - Averages about 1-2 meals per day consisting of a regular diet; Continues to have difficulty with swallowing; No caffeine  Exercise - Some exercise on occasion through walking.   2) Preventative Exams / Immunizations:  Dental -- Due for exam   Vision -- Due for exam    Health Maintenance  Topic Date Due  . TETANUS/TDAP  10/29/2013  . OPHTHALMOLOGY EXAM  07/10/2016  . HEMOGLOBIN A1C  08/15/2016  . PNA vac Low Risk Adult (2 of 2 - PPSV23) 10/06/2016  . FOOT EXAM  02/13/2017  . INFLUENZA VACCINE  05/29/2017  . MAMMOGRAM  03/01/2019  . COLONOSCOPY  05/19/2022  . DEXA SCAN  Completed  . Hepatitis C Screening  Completed     Immunization History  Administered Date(s) Administered  . Influenza Split 08/04/2016  . Influenza Whole 07/21/2008, 08/19/2009, 08/30/2010, 07/29/2012  . Influenza,inj,Quad PF,36+ Mos 07/08/2015  . Pneumococcal Conjugate-13 10/07/2015  . Pneumococcal Polysaccharide-23 10/30/2003  . Td 10/30/2003  . Zoster 05/26/2008    RISK FACTORS  Tobacco History  Smoking Status  . Current Some Day Smoker  . Packs/day: 0.50  . Years: 50.00  . Types: Cigarettes  Smokeless Tobacco  . Never Used    Comment: 3 cigarettes a day     Cardiac risk factors: advanced age (older than 68 for men, 52 for women), diabetes mellitus, dyslipidemia, hypertension and smoking/ tobacco exposure.  Depression Screen  No flowsheet data found.   Activities of Daily Living In your present state of health, do you have any difficulty performing the following activities?:  Driving? No Managing money?  No Feeding yourself? No Getting from bed to chair? No Climbing a  flight of stairs? No Preparing food and eating?: No Bathing or showering? No Getting dressed: No Getting to the toilet? No Using the toilet: No Moving around from place to place: No In the past year have you fallen or had a near fall?: No   Home Safety Has smoke detector and wears seat belts. No excess sun exposure. Are there smokers in your home (other than you)?  No Do you feel safe at home?  Yes  Hearing Difficulties: No Do you often ask people to speak up or repeat themselves? No Do you experience ringing or noises in your ears? No  Do you have difficulty understanding soft or whispered voices? No    Cognitive Testing  Alert? Yes   Normal Appearance? Yes  Oriented to person? Yes  Place? Yes   Time? Yes  Recall of three objects?  Yes  Can perform simple calculations? Yes  Displays appropriate judgment? Yes  Can read the correct time from a watch face? Yes  Do you feel that you have a problem with memory? No  Do you often misplace items? No   Advanced Directives have been discussed with the patient? Yes   Current Physicians/Providers and Suppliers  1. Terri Piedra, FNP - Internal Medicine 2. Baltazar Apo, MD - Pulmonology 3. Scarlette Shorts, MD - Gastroenterology 4. Narda Amber, DO - Neurology  Indicate any recent Medical Services you may have received from other than [facility] providers in the past  year (date may be approximate).  All answers were reviewed with the patient and necessary referrals were made:  Mauricio Po, [place]   05/31/2017    Allergies  Allergen Reactions  . Symbicort [Budesonide-Formoterol Fumarate] Other (See Comments)    Throat swelling Throat swelling     Outpatient Medications Prior to Visit  Medication Sig Dispense Refill  . albuterol (PROVENTIL) (2.5 MG/3ML) 0.083% nebulizer solution Take 3 mLs (2.5 mg total) by nebulization every 4 (four) hours as needed for wheezing or shortness of breath. 75 mL 12  . AMBULATORY NON FORMULARY MEDICATION 1  Units by Other route daily. Power lift chair 1 Units 0  . hydrochlorothiazide (HYDRODIURIL) 25 MG tablet Take 1 tablet (25 mg total) by mouth daily. Overdue for annual appt w/labs must see provide for refills 30 tablet 0  . Ipratropium-Albuterol (COMBIVENT) 20-100 MCG/ACT AERS respimat Inhale 1 puff into the lungs 4 (four) times daily. 1 Inhaler 5  . losartan (COZAAR) 50 MG tablet TAKE ONE TABLET BY MOUTH  DAILY 90 tablet 1  . rosuvastatin (CRESTOR) 5 MG tablet TAKE ONE TABLET BY MOUTH ONCE DAILY 90 tablet 1  . SYNTHROID 88 MCG tablet TAKE ONE TABLET BY MOUTH ONCE DAILY BEFORE BREAKFAST. 90 tablet 0  . Tiotropium Bromide-Olodaterol (STIOLTO RESPIMAT) 2.5-2.5 MCG/ACT AERS Inhale 2 puffs into the lungs daily. 1 Inhaler 5  . pantoprazole (PROTONIX) 20 MG tablet Take 40 mg by mouth.    . budesonide-formoterol (SYMBICORT) 160-4.5 MCG/ACT inhaler Inhale 2 puffs into the lungs 2 (two) times daily. (Patient not taking: Reported on 05/31/2017) 1 Inhaler 0  . Diphenhyd-Hydrocort-Nystatin (FIRST-DUKES MOUTHWASH) SUSP Use as directed 5 mLs in the mouth or throat 4 (four) times daily. (Patient not taking: Reported on 05/31/2017) 140 mL 0  . nizatidine (AXID) 15 MG/ML SOLN Take 10 mLs (150 mg total) by mouth 2 (two) times daily. (Patient not taking: Reported on 05/31/2017) 600 mL 1  . ranitidine (ZANTAC) 150 MG tablet Take 1 tablet (150 mg total) by mouth at bedtime. (Patient not taking: Reported on 05/31/2017) 30 tablet 1   No facility-administered medications prior to visit.      Past Medical History:  Diagnosis Date  . Acute sinusitis, unspecified   . Cervicalgia   . Chest pain, unspecified   . COPD (chronic obstructive pulmonary disease) ([place])   . Degeneration of cervical intervertebral disc   . Diverticulosis of colon (without mention of hemorrhage)   . Esophageal reflux   . Hyperlipidemia   . IBS (irritable bowel syndrome)   . 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 Obstructive chronic bronchitis with  exacerbation ([place])   . Personal history of peptic ulcer disease   . Unspecified essential hypertension   . Unspecified hypothyroidism   . Unspecified vitamin D deficiency   . Vitiligo      Past Surgical History:  Procedure Laterality Date  . ABDOMINAL HYSTERECTOMY W/ PARTIAL VAGINACTOMY    . BREAST BIOPSY Right   . BREAST EXCISIONAL BIOPSY Right 1998  . COLONOSCOPY  05/19/2012  . ESOPHAGOGASTRODUODENOSCOPY  01/03/2011   normal   . THYROIDECTOMY, PARTIAL    . TONSILLECTOMY       Family History  Problem Relation Age of Onset  . Diabetes Mother        everyone on both sides  . Hypertension Mother        everyone on both sides  . Heart attack Mother   . Heart attack Maternal Grandmother   . Diabetes Maternal Grandmother   .  Hypertension Maternal Grandmother   . Diabetes Maternal Grandfather   . Hypertension Maternal Grandfather   . Diabetes Paternal Grandmother   . Hypertension Paternal Grandmother   . Diabetes Paternal Grandfather   . Hypertension Paternal Grandfather   . Colon cancer Neg Hx   . Stomach cancer Neg Hx   . Esophageal cancer Neg Hx   . Rectal cancer Neg Hx      Social History   Social History  . Marital status: Divorced    Spouse name: N/A  . Number of children: 3  . Years of education: 20+   Occupational History  . Retired    Social History Main Topics  . Smoking status: Current Some Day Smoker    Packs/day: 0.50    Years: 50.00    Types: Cigarettes  . Smokeless tobacco: Never Used     Comment: 3 cigarettes a day  . Alcohol use No  . Drug use: No  . Sexual activity: Not on file   Other Topics Concern  . Not on file   Social History Narrative   She lives alone.  She moved to [place] in 1998.  She has three grown children (2 sons, 1 daughter).  One son lives in [place].   Retired Retail banker   Highest level of education:  PhD in [place], [place] in community agency counselling   Denies any religious beliefs effecting health  care.         Review of Systems  Constitutional: Denies fever, chills, fatigue, or significant weight gain/loss. HENT: Head: Denies headache or neck pain Ears: Denies changes in hearing, ringing in ears, earache, drainage Nose: Denies discharge, stuffiness, itching, nosebleed, sinus pain Throat: Denies sore throat, hoarseness, dry mouth, sores, thrush Eyes: Denies loss/changes in vision, pain, redness, blurry/double vision, flashing lights Cardiovascular: Denies chest pain/discomfort, tightness, palpitations, shortness of breath with activity, difficulty lying down, swelling, sudden awakening with shortness of breath Respiratory: Denies shortness of breath, cough, sputum production, wheezing Gastrointestinal: Denies dysphasia, heartburn, change in appetite, nausea, change in bowel habits, rectal bleeding, constipation, diarrhea, yellow skin or eyes Genitourinary: Denies frequency, urgency, burning/pain, blood in urine, incontinence, change in urinary strength. Musculoskeletal: Denies muscle/joint pain, stiffness, back pain, redness or swelling of joints, trauma Skin: Denies rashes, lumps, itching, dryness, color changes, or hair/nail changes Neurological: Denies dizziness, fainting, seizures, weakness, numbness, tingling, tremor Psychiatric - Denies nervousness, stress, depression or memory loss Endocrine: Denies heat or cold intolerance, sweating, frequent urination, excessive thirst, changes in appetite Hematologic: Denies ease of bruising or bleeding    Objective:    BP 100/68 (BP Location: Left Arm, Patient Position: Sitting, Cuff Size: Normal)   Pulse 70   Temp (!) 97.5 F (36.4 C) (Oral)   Resp 12   Ht 5\' 6"  (1.676 m)   Wt 90 lb (40.8 kg)   BMI 14.53 kg/m  Nursing note and vital signs reviewed.   Physical Exam  Constitutional: She is oriented to person, place, and time. She appears cachectic.  HENT:  Head: Normocephalic.  Right Ear: Hearing, tympanic membrane, external  ear and ear canal normal.  Left Ear: Hearing, tympanic membrane, external ear and ear canal normal.  Nose: Nose normal.  Mouth/Throat: Uvula is midline, oropharynx is clear and moist and mucous membranes are normal.  Eyes: Pupils are equal, round, and reactive to light. Conjunctivae and EOM are normal.  Neck: Neck supple. No JVD present. No tracheal deviation present. No thyromegaly present.  Cardiovascular: Normal rate, regular rhythm, normal heart sounds  and intact distal pulses.   Pulmonary/Chest: Effort normal and breath sounds normal.  Abdominal: Soft. Bowel sounds are normal. She exhibits no distension and no mass. There is no tenderness. There is no rebound and no guarding.  Musculoskeletal: Normal range of motion. She exhibits no edema or tenderness.  Lymphadenopathy:    She has no cervical adenopathy.  Neurological: She is alert and oriented to person, place, and time. She has normal reflexes. No cranial nerve deficit. She exhibits normal muscle tone. Coordination normal.  Skin: Skin is warm and dry.  Psychiatric: She has a normal mood and affect. Her behavior is normal. Judgment and thought content normal.       Assessment & Plan:   During the course of the visit the patient was educated and counseled about appropriate screening and preventive services including:    Pneumococcal vaccine   Influenza vaccine  Colorectal cancer screening  Diabetes screening  Nutrition counseling   Diet review for nutrition referral? Yes ____  Not Indicated _X___   Patient Instructions (the written plan) was given to the patient.  Medicare Attestation I have personally reviewed: The patient's medical and social history Their use of alcohol, tobacco or illicit drugs Their current medications and supplements The patient's functional ability including ADLs,fall risks, home safety risks, cognitive, and hearing and visual impairment Diet and physical activities Evidence for depression or  mood disorders  The patient's weight, height, BMI,  have been recorded in the chart.  I have made referrals, counseling, and provided education to the patient based on review of the above and I have provided the patient with a written personalized care plan for preventive services.     Problem List Items Addressed This Visit      Cardiovascular and Mediastinum   Essential hypertension    Blood pressure well controlled with current medication regimen and no adverse side effects. Continue to monitor blood pressure at home. Continue current dosage of losartan.         Digestive   GERD    Remains labile and continues to have abdominal pains and difficulty swallowing. Continue follow up with GI.         Endocrine   Hypothyroidism    Maintained on levothyroxine. Obtain TSH. Continue current dosage of levothyroxine pending TSH results.       Relevant Orders   TSH   Type 2 diabetes mellitus, controlled ([place])   Relevant Orders   Hemoglobin A1c   Urine Microalbumin w/creat. ratio   Ambulatory referral to Podiatry     Other   HYPERLIPIDEMIA, MIXED    Stable and maintained on rosuvastatin with no adverse side effects or myalgias. Obtain lipid profile. Continue current dosage of rosuvastatin pending lipid profile results.       Smoker   Encounter for general adult medical examination with abnormal findings    1) Anticipatory Guidance: Discussed importance of wearing a seatbelt while driving and not texting while driving; changing batteries in smoke detector at least once annually; wearing suntan lotion when outside; eating a balanced and moderate diet; getting physical activity at least 30 minutes per day.  2) Immunizations / Screenings / Labs:  Declines tetanus. All other immunizations are up-to-date per recommendations. Due for colon cancer screening and scheduled for gastroenterology appointment independently. Obtain DEXA for bone marrow density screening. Due for a vision and dental  exam encouraged to be completed independently. All other screenings are up-to-date per recommendations.  Overall well exam with risk factors for cardiovascular disease  including hypertension, hyperlipidemia, and type 2 diabetes. Chronic conditions appear adequately managed through medication regimen. She does continue to struggle with eating secondary to abdominal discomforts and difficulty swallowing. Encouraged to eat small, frequent meals that are high in fat and protein. Nutritional supplements as needed. Continue to follow-up with gastroenterology. Advised to discontinue tobacco use to reduce risk for end organ damage in the future. Continue other healthy lifestyle behaviors and choices. Follow-up prevention exam in 1 year. Follow-up office visit for chronic conditions and pending blood work.  Obtain CBC, CMET, and lipid profile.         Relevant Orders   CBC   Comprehensive metabolic panel   Lipid panel   Medicare annual wellness visit, initial - Primary    Reviewed and updated patient's medical, surgical, family and social history. Medications and allergies were also reviewed. Basic screenings for depression, activities of daily living, hearing, cognition and safety were performed. Provider list was updated and health plan was provided to the patient.         Other Visit Diagnoses    Estrogen deficiency       Relevant Orders   DG DXA FRACTURE ASSESSMENT       I have discontinued Ms. Dexheimer's nizatidine, budesonide-formoterol, FIRST-DUKES MOUTHWASH, and ranitidine. I am also having her maintain her albuterol, losartan, rosuvastatin, Ipratropium-Albuterol, pantoprazole, SYNTHROID, AMBULATORY NON FORMULARY MEDICATION, Tiotropium Bromide-Olodaterol, and hydrochlorothiazide.   Follow-up: Return in about 3 months (around 08/31/2017), or if symptoms worsen or fail to improve.   Mauricio Po, FNP

## 2017-05-31 NOTE — Assessment & Plan Note (Signed)
Maintained on levothyroxine. Obtain TSH. Continue current dosage of levothyroxine pending TSH results.

## 2017-05-31 NOTE — Assessment & Plan Note (Signed)
Remains labile and continues to have abdominal pains and difficulty swallowing. Continue follow up with GI.

## 2017-05-31 NOTE — Assessment & Plan Note (Signed)
Reviewed and updated patient's medical, surgical, family and social history. Medications and allergies were also reviewed. Basic screenings for depression, activities of daily living, hearing, cognition and safety were performed. Provider list was updated and health plan was provided to the patient.  

## 2017-05-31 NOTE — Patient Instructions (Signed)
Thank you for choosing [facility].  SUMMARY AND INSTRUCTIONS:  Please continue to take your medications as prescribed.  Follow up with Dr. Henrene Pastor for your colonoscopy and swallowing.  They will call to schedule your appointment for podiatry for foot care.  Continue to eat small, frequent meals high in protein and fat.   Schedule your bone density scan at the front.   Medication:  Your prescription(s) have been submitted to your pharmacy or been printed and provided for you. Please take as directed and contact our office if you believe you are having problem(s) with the medication(s) or have any questions.  Labs:  Please stop by the lab on the lower level of the building for your blood work. Your results will be released to Kristin [name] (or called to you) after review, usually within 72 hours after test completion. If any changes need to be made, you will be notified at that same time.  1.) The lab is open from 7:30am to 5:30 pm Monday-Friday 2.) No appointment is necessary 3.) Fasting (if needed) is 6-8 hours after food and drink; black coffee and water are okay   Imaging / Radiology:  Please stop by radiology on the basement level of the building for your x-rays. Your results will be released to Kristin [name] (or called to you) after review, usually within 72 hours after test completion. If any treatments or changes are necessary, you will be notified at that same time.  Follow up:  If your symptoms worsen or fail to improve, please contact our office for further instruction, or in case of emergency go directly to the emergency room at the closest medical facility.   Health Maintenance  Topic Date Due  . TETANUS/TDAP  10/29/2013  . OPHTHALMOLOGY EXAM  07/10/2016  . HEMOGLOBIN A1C  08/15/2016  . PNA vac Low Risk Adult (2 of 2 - PPSV23) 10/06/2016  . FOOT EXAM  02/13/2017  . INFLUENZA VACCINE  05/29/2017  . MAMMOGRAM  03/01/2019  . COLONOSCOPY  05/19/2022  . DEXA SCAN   Completed  . Hepatitis C Screening  Completed    Health Maintenance for Postmenopausal Women Menopause is a normal process in which your reproductive ability comes to an end. This process happens gradually over a span of months to years, usually between the ages of 18 and 53. Menopause is complete when you have missed 12 consecutive menstrual periods. It is important to talk with your health care provider about some of the most common conditions that affect postmenopausal women, such as heart disease, cancer, and bone loss (osteoporosis). Adopting a healthy lifestyle and getting preventive care can help to promote your health and wellness. Those actions can also lower your chances of developing some of these common conditions. What should I know about menopause? During menopause, you may experience a number of symptoms, such as:  Moderate-to-severe hot flashes.  Night sweats.  Decrease in sex drive.  Mood swings.  Headaches.  Tiredness.  Irritability.  Memory problems.  Insomnia.  Choosing to treat or not to treat menopausal changes is an individual decision that you make with your health care provider. What should I know about hormone replacement therapy and supplements? Hormone therapy products are effective for treating symptoms that are associated with menopause, such as hot flashes and night sweats. Hormone replacement carries certain risks, especially as you become older. If you are thinking about using estrogen or estrogen with progestin treatments, discuss the benefits and risks with your health care provider. What should I  know about heart disease and stroke? Heart disease, heart attack, and stroke become more likely as you age. This may be due, in part, to the hormonal changes that your body experiences during menopause. These can affect how your body processes dietary fats, triglycerides, and cholesterol. Heart attack and stroke are both medical emergencies. There are many  things that you can do to help prevent heart disease and stroke:  Have your blood pressure checked at least every 1-2 years. High blood pressure causes heart disease and increases the risk of stroke.  If you are 26-3 years old, ask your health care provider if you should take aspirin to prevent a heart attack or a stroke.  Do not use any tobacco products, including cigarettes, chewing tobacco, or electronic cigarettes. If you need help quitting, ask your health care provider.  It is important to eat a healthy diet and maintain a healthy weight. ? Be sure to include plenty of vegetables, fruits, low-fat dairy products, and lean protein. ? Avoid eating foods that are high in solid fats, added sugars, or salt (sodium).  Get regular exercise. This is one of the most important things that you can do for your health. ? Try to exercise for at least 150 minutes each week. The type of exercise that you do should increase your heart rate and make you sweat. This is known as moderate-intensity exercise. ? Try to do strengthening exercises at least twice each week. Do these in addition to the moderate-intensity exercise.  Know your numbers.Ask your health care provider to check your cholesterol and your blood glucose. Continue to have your blood tested as directed by your health care provider.  What should I know about cancer screening? There are several types of cancer. Take the following steps to reduce your risk and to catch any cancer development as early as possible. Breast Cancer  Practice breast self-awareness. ? This means understanding how your breasts normally appear and feel. ? It also means doing regular breast self-exams. Let your health care provider know about any changes, no matter how small.  If you are 21 or older, have a clinician do a breast exam (clinical breast exam or CBE) every year. Depending on your age, family history, and medical history, it may be recommended that you  also have a yearly breast X-ray (mammogram).  If you have a family history of breast cancer, talk with your health care provider about genetic screening.  If you are at high risk for breast cancer, talk with your health care provider about having an MRI and a mammogram every year.  Breast cancer (BRCA) gene test is recommended for women who have family members with BRCA-related cancers. Results of the assessment will determine the need for genetic counseling and BRCA1 and for BRCA2 testing. BRCA-related cancers include these types: ? Breast. This occurs in males or females. ? Ovarian. ? Tubal. This may also be called fallopian tube cancer. ? Cancer of the abdominal or pelvic lining (peritoneal cancer). ? Prostate. ? Pancreatic.  Cervical, Uterine, and Ovarian Cancer Your health care provider may recommend that you be screened regularly for cancer of the pelvic organs. These include your ovaries, uterus, and vagina. This screening involves a pelvic exam, which includes checking for microscopic changes to the surface of your cervix (Pap test).  For women ages 21-65, health care providers may recommend a pelvic exam and a Pap test every three years. For women ages 59-65, they may recommend the Pap test and pelvic exam,  combined with testing for human papilloma virus (HPV), every five years. Some types of HPV increase your risk of cervical cancer. Testing for HPV may also be done on women of any age who have unclear Pap test results.  Other health care providers may not recommend any screening for nonpregnant women who are considered low risk for pelvic cancer and have no symptoms. Ask your health care provider if a screening pelvic exam is right for you.  If you have had past treatment for cervical cancer or a condition that could lead to cancer, you need Pap tests and screening for cancer for at least 20 years after your treatment. If Pap tests have been discontinued for you, your risk factors  (such as having a new sexual partner) need to be reassessed to determine if you should start having screenings again. Some women have medical problems that increase the chance of getting cervical cancer. In these cases, your health care provider may recommend that you have screening and Pap tests more often.  If you have a family history of uterine cancer or ovarian cancer, talk with your health care provider about genetic screening.  If you have vaginal bleeding after reaching menopause, tell your health care provider.  There are currently no reliable tests available to screen for ovarian cancer.  Lung Cancer Lung cancer screening is recommended for adults 14-72 years old who are at high risk for lung cancer because of a history of smoking. A yearly low-dose CT scan of the lungs is recommended if you:  Currently smoke.  Have a history of at least 30 pack-years of smoking and you currently smoke or have quit within the past 15 years. A pack-year is smoking an average of one pack of cigarettes per day for one year.  Yearly screening should:  Continue until it has been 15 years since you quit.  Stop if you develop a health problem that would prevent you from having lung cancer treatment.  Colorectal Cancer  This type of cancer can be detected and can often be prevented.  Routine colorectal cancer screening usually begins at age 4 and continues through age 20.  If you have risk factors for colon cancer, your health care provider may recommend that you be screened at an earlier age.  If you have a family history of colorectal cancer, talk with your health care provider about genetic screening.  Your health care provider may also recommend using home test kits to check for hidden blood in your stool.  A small camera at the end of a tube can be used to examine your colon directly (sigmoidoscopy or colonoscopy). This is done to check for the earliest forms of colorectal cancer.  Direct  examination of the colon should be repeated every 5-10 years until age 20. However, if early forms of precancerous polyps or small growths are found or if you have a family history or genetic risk for colorectal cancer, you may need to be screened more often.  Skin Cancer  Check your skin from head to toe regularly.  Monitor any moles. Be sure to tell your health care provider: ? About any new moles or changes in moles, especially if there is a change in a mole's shape or color. ? If you have a mole that is larger than the size of a pencil eraser.  If any of your family members has a history of skin cancer, especially at a young age, talk with your health care provider about genetic screening.  Always use sunscreen. Apply sunscreen liberally and repeatedly throughout the day.  Whenever you are outside, protect yourself by wearing long sleeves, pants, a wide-brimmed hat, and sunglasses.  What should I know about osteoporosis? Osteoporosis is a condition in which bone destruction happens more quickly than new bone creation. After menopause, you may be at an increased risk for osteoporosis. To help prevent osteoporosis or the bone fractures that can happen because of osteoporosis, the following is recommended:  If you are 59-77 years old, get at least 1,000 mg of calcium and at least 600 mg of vitamin D per day.  If you are older than age 62 but younger than age 61, get at least 1,200 mg of calcium and at least 600 mg of vitamin D per day.  If you are older than age 56, get at least 1,200 mg of calcium and at least 800 mg of vitamin D per day.  Smoking and excessive alcohol intake increase the risk of osteoporosis. Eat foods that are rich in calcium and vitamin D, and do weight-bearing exercises several times each week as directed by your health care provider. What should I know about how menopause affects my mental health? Depression may occur at any age, but it is more common as you become  older. Common symptoms of depression include:  Low or sad mood.  Changes in sleep patterns.  Changes in appetite or eating patterns.  Feeling an overall lack of motivation or enjoyment of activities that you previously enjoyed.  Frequent crying spells.  Talk with your health care provider if you think that you are experiencing depression. What should I know about immunizations? It is important that you get and maintain your immunizations. These include:  Tetanus, diphtheria, and pertussis (Tdap) booster vaccine.  Influenza every year before the flu season begins.  Pneumonia vaccine.  Shingles vaccine.  Your health care provider may also recommend other immunizations. This information is not intended to replace advice given to you by your health care provider. Make sure you discuss any questions you have with your health care provider. Document Released: 12/07/2005 Document Revised: 05/04/2016 Document Reviewed: 07/19/2015 Elsevier Interactive Patient Education  2018 [employer].

## 2017-06-02 ENCOUNTER — Encounter: Payer: Self-pay | Admitting: Family

## 2017-06-04 ENCOUNTER — Other Ambulatory Visit: Payer: Self-pay | Admitting: Family

## 2017-06-04 ENCOUNTER — Ambulatory Visit (INDEPENDENT_AMBULATORY_CARE_PROVIDER_SITE_OTHER)
Admission: RE | Admit: 2017-06-04 | Discharge: 2017-06-04 | Disposition: A | Discharge status: Home / Self Care | Payer: Medicare HMO | Source: Ambulatory Visit | Attending: Family | Admitting: Family

## 2017-06-04 DIAGNOSIS — E2839 Other primary ovarian failure: Secondary | ICD-10-CM

## 2017-06-07 ENCOUNTER — Other Ambulatory Visit: Payer: Self-pay | Admitting: Emergency Medicine

## 2017-06-09 ENCOUNTER — Encounter: Payer: Self-pay | Admitting: Family

## 2017-06-12 ENCOUNTER — Ambulatory Visit (INDEPENDENT_AMBULATORY_CARE_PROVIDER_SITE_OTHER): Payer: Medicare HMO | Admitting: Internal Medicine

## 2017-06-12 ENCOUNTER — Encounter: Payer: Self-pay | Admitting: Internal Medicine

## 2017-06-12 VITALS — BP 102/60 | HR 76 | Ht 66.0 in | Wt 89.6 lb

## 2017-06-12 DIAGNOSIS — R1310 Dysphagia, unspecified: Secondary | ICD-10-CM | POA: Diagnosis not present

## 2017-06-12 DIAGNOSIS — K5900 Constipation, unspecified: Secondary | ICD-10-CM

## 2017-06-12 DIAGNOSIS — R634 Abnormal weight loss: Secondary | ICD-10-CM

## 2017-06-12 NOTE — Patient Instructions (Addendum)
You have been given a separate informational sheet regarding your tobacco use, the importance of quitting and local resources to help you quit.  Please follow up as needed 

## 2017-06-12 NOTE — Progress Notes (Signed)
HISTORY OF PRESENT ILLNESS:  Kristin [name] is a 71 y.o. female with multiple medical problems the most significant of which is severe emphysema and unspecified neuromuscular disorder. Patient has been seen in this office periodically for chronic bloating, chronic dysphagia for many years duration, and varying bowel habits. Previous upper endoscopy in 2012 was normal. Swallowing evaluation with speech pathology was unremarkable. Colonoscopy in 2013 Limited by poor preparation with suboptimal follow-up barium enema secondary to poor preparation. Last evaluated 11/15/2015 with irregular bowel habits, chronic bloating, chronic dysphagia. She subsequently went to [facility] for second opinion regarding her dysphagia. That consultation note has been reviewed. Imbalance, felt secondary to neurologic disorder Follow-up colonoscopy this year was to be considered. She did receive recall notification. She presents today with chief complaints of concerns ("afraid") over having colonoscopy given her other health issues, complaints of anorexia, weight loss, bloating, and altered bowel habits. Since her visit last year she has continued to lose weight. She reports a poor appetite. Severe emphysema per her pulmonary evaluation. 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 Said to have deficiencies in zinc and copper for which she has not been taking supplementation. States it upsets her stomach. Bowels tend to be constipated. May go up to a week without bowel movements. Thereafter may have several days of loose stools. Some abdominal discomfort with worsening constipation. Review of outside records and imaging shows a CT scan of the abdomen and pelvis with contrast 02/07/2017. No acute abnormalities. Large volumes of stool throughout the colon. Previous gastric emptying normal. She continues with complaints of difficulty swallowing. No choking or nasal  regurgitation. No food "sticking". No vomiting. No bleeding. She is being treated for oral thrush. Review of outside laboratories from 2 weeks ago showed no significant abnormalities of CBC, comprehensive metabolic panel, or TSH.  REVIEW OF SYSTEMS:  All non-GI ROS negative except for breast changes, muscle cramps, sleeping problems, ankle swelling, shortness of breath  Past Medical History:  Diagnosis Date  . Acute sinusitis, unspecified   . Breast calcifications   . Cervicalgia   . Chest pain, unspecified   . COPD (chronic obstructive pulmonary disease) ([place])   . Degeneration of cervical intervertebral disc   . Diverticulosis of colon (without mention of hemorrhage)   . Esophageal reflux   . Hyperlipidemia   . IBS (irritable bowel syndrome)   . 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 Obstructive chronic bronchitis with exacerbation ([place])   . Personal history of peptic ulcer disease   . Unspecified essential hypertension   . Unspecified hypothyroidism   . Unspecified vitamin D deficiency   . Vitiligo     Past Surgical History:  Procedure Laterality Date  . ABDOMINAL HYSTERECTOMY W/ PARTIAL VAGINACTOMY    . BREAST BIOPSY Right   . BREAST EXCISIONAL BIOPSY Right 1998  . COLONOSCOPY  05/19/2012  . ESOPHAGOGASTRODUODENOSCOPY  01/03/2011   normal   . THYROIDECTOMY, PARTIAL    . TONSILLECTOMY      Social History Adaora Mchaney  reports that she has been smoking Cigarettes.  She has a 25.00 pack-year smoking history. She has never used smokeless tobacco. She reports that she does not drink alcohol or use drugs.  family history includes Diabetes in her maternal grandfather, maternal grandmother, mother, paternal grandfather, and paternal grandmother; Heart attack in her maternal grandmother and mother; Hypertension in her maternal grandfather, maternal grandmother, mother, paternal grandfather, and paternal grandmother.  Allergies  Allergen Reactions  . Symbicort  [  Budesonide-Formoterol Fumarate] Other (See Comments)    Throat swelling Throat swelling       PHYSICAL EXAMINATION: Vital signs: BP 102/60   Pulse 76   Ht 5\' 6"  (1.676 m)   Wt 89 lb 9.6 oz (40.6 kg)   BMI 14.46 kg/m   Constitutional: Chronically ill appearing, thin, muscular wasting, no acute distress Psychiatric: alert and oriented x3, cooperative Eyes: extraocular movements intact, anicteric, conjunctiva pink Mouth: oral pharynx moist, no lesions. There is thrush on the tongue Neck: supple without thyromegaly Lymph: no lymphadenopathy Cardiovascular: heart regular rate and rhythm, no murmur Lungs: Distant breath sounds but otherwise clear to auscultation bilaterally Abdomen: Scaphoid, soft, nontender, nondistended, no obvious ascites, no peritoneal signs, normal bowel sounds, no organomegaly Rectal: Omitted Extremities: no clubbing or cyanosis. Trace lower extremity edema bilaterally Skin: no lesions on visible extremities Neuro: No focal deficits. No asterixis.   ASSESSMENT:  #1. Chronic stable dysphagia. 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 Ongoing weight loss. Suspect secondary to advanced lung disease, or appetite, muscular wasting. #3. Altered bowel habits. Ongoing #4. Not appropriate candidate for screening colonoscopy. Patient is not interested #5. 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 Oral thrush. Identified elsewhere being treated   PLAN:  #1. 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 Encouraged supplement with high-calorie product such as ensure #3. More regular use of MiraLAX to avoid significant constipation #4. No plans for colonoscopy #5. Return to the care of PCP and other specialists for chronic medical problems care. She could consider tertiary evaluation regarding her undefined neurologic process. She can discuss this with her PCP  25 minutes spent face-to-face with the patient. Greater than 50% a time use for counseling regarding the above complaints and  recommendations we will forward. Multiple questions answered

## 2017-06-18 DIAGNOSIS — J449 Chronic obstructive pulmonary disease, unspecified: Secondary | ICD-10-CM | POA: Diagnosis not present

## 2017-06-28 ENCOUNTER — Ambulatory Visit (INDEPENDENT_AMBULATORY_CARE_PROVIDER_SITE_OTHER): Payer: Medicare HMO | Admitting: Emergency Medicine

## 2017-06-28 ENCOUNTER — Encounter: Payer: Self-pay | Admitting: Emergency Medicine

## 2017-06-28 VITALS — BP 100/60 | HR 80 | Ht 66.25 in | Wt 89.0 lb

## 2017-06-28 DIAGNOSIS — Z23 Encounter for immunization: Secondary | ICD-10-CM | POA: Diagnosis not present

## 2017-06-28 DIAGNOSIS — J449 Chronic obstructive pulmonary disease, unspecified: Secondary | ICD-10-CM | POA: Diagnosis not present

## 2017-06-28 DIAGNOSIS — R911 Solitary pulmonary nodule: Secondary | ICD-10-CM | POA: Diagnosis not present

## 2017-06-28 NOTE — Patient Instructions (Addendum)
We will follow your left lung nodule with a repeat CT scan in December 2018.  Continue Stiolto 2 puffs once a day Take albuterol 2 puffs up to every 4 hours if needed for shortness of breath.  Work hard on stopping smoking completely. Follow with Dr Lamonte Sakai in December following your CT scan so that we can review the results.

## 2017-06-28 NOTE — Assessment & Plan Note (Addendum)
Remains quite limited. She is trying to cut down on her smoking. Continue Stiolto, albuterol as needed. Flu shot today. She also requests the TDAP today - will administer

## 2017-06-28 NOTE — Addendum Note (Signed)
Addended by: Desmond Dike C on: 06/28/2017 10:02 AM   Modules accepted: Orders

## 2017-06-28 NOTE — Progress Notes (Signed)
  Subjective:    Patient ID: Kristin [name], female    DOB: 1945/11/04  MRN: [id number]   Brief patient profile:  50 yobf smoker With emphysema by CT scan, severe obstruction on pulmonary function testing FEV1 0.84 (40%)  ROV 04/03/27 --  71 year old woman, active smoker with a history of COPD, chronic cough with chronic upper airway irritation syndrome. Also with a history of irritable bowel syndrome. She was seen approximate one month ago with increased abdominal discomfort and what appear to be associated worsening dyspnea. She had run out of her Combivent at that time. She was given Symbicort temporarily until her Combivent to be refilled. She returns now for follow-up. Chest x-ray on 5/14 showed severe bullous emphysema without infiltrates. There was a suspected nodular density projecting in the left lower lobe of unclear etiology and a CT chest was recommended. She is back on combivent 1 puff, now qid. Her breathing is worst at night, difficulty laying flat. Her IBS affects her breathing. Smokes about 1 cig a day. Uses albuterol rarely as long as she has her combivent.   ROV 06/28/17 -- follow-up visit for patient with tobacco use, severe obstruction and COPD, chronic cough. Also with a suspected left lower lobe nodule by chest x-ray. We performed a CT scan of the chest on 04/05/17 that I have reviewed, confirms a 6 mm lingular nodule. She tells me that she has been having a lot of abdominal pain. She has been on stiolto, uses albuterol about 2x a day. She is off combivent. Minimal cough. Weight has been stable ~90 lbs. No fevers.      Objective:   Vitals:   06/28/17 0931  BP: 100/60  Pulse: 80  SpO2: 93%  Weight: 89 lb (40.4 kg)  Height: 5' 6.25" (1.683 m)   Gen: Pleasant, thin woman, in no distress,  normal affect  ENT: No lesions,  mouth clear,  oropharynx clear, no postnasal drip  Neck: No JVD, no TMG, no carotid bruits  Lungs: No use of accessory muscles, Very distant, clear  without rales or rhonchi  Cardiovascular: RRR, heart sounds normal, no murmur or gallops, no peripheral edema  Musculoskeletal: No deformities, no cyanosis or clubbing  Neuro: alert, non focal  Skin: Warm, some tightness that is suggestive of possible scleroderma.     Assessment & Plan:  Pulmonary nodule 6 mm lingular nodule, etiology unclear. She is at high risk for lung cancer. Unfortunately given the degree of her emphysema, her overall deconditioning and other medical problems she is a challenging candidate for biopsy, not a candidate for resection. I believe that we should follow with serial scans, discuss options for biopsy versus empiric therapy versus waitfull watching  COPD GOLD D Remains quite limited. She is trying to cut down on her smoking. Continue Stiolto, albuterol as needed. Flu shot today. She also requests the TDAP today - will administer  Baltazar Apo, MD, PhD 06/28/2017, 9:51 AM [facility] [phone] or if no answer [phone]

## 2017-06-28 NOTE — Assessment & Plan Note (Signed)
6 mm lingular nodule, etiology unclear. She is at high risk for lung cancer. Unfortunately given the degree of her emphysema, her overall deconditioning and other medical problems she is a challenging candidate for biopsy, not a candidate for resection. I believe that we should follow with serial scans, discuss options for biopsy versus empiric therapy versus waitfull watching

## 2017-07-02 ENCOUNTER — Ambulatory Visit: Payer: Medicare HMO

## 2017-07-03 ENCOUNTER — Other Ambulatory Visit: Payer: Self-pay | Admitting: Family

## 2017-07-03 DIAGNOSIS — I10 Essential (primary) hypertension: Secondary | ICD-10-CM

## 2017-07-04 ENCOUNTER — Encounter: Payer: Self-pay | Admitting: Podiatry

## 2017-07-04 ENCOUNTER — Ambulatory Visit: Payer: Medicare HMO | Admitting: Podiatry

## 2017-07-04 ENCOUNTER — Ambulatory Visit (INDEPENDENT_AMBULATORY_CARE_PROVIDER_SITE_OTHER): Payer: Medicare HMO | Admitting: Podiatry

## 2017-07-04 VITALS — BP 95/57 | HR 89

## 2017-07-04 DIAGNOSIS — M79609 Pain in unspecified limb: Secondary | ICD-10-CM | POA: Diagnosis not present

## 2017-07-04 DIAGNOSIS — B351 Tinea unguium: Secondary | ICD-10-CM | POA: Diagnosis not present

## 2017-07-04 DIAGNOSIS — E119 Type 2 diabetes mellitus without complications: Secondary | ICD-10-CM | POA: Diagnosis not present

## 2017-07-04 NOTE — Progress Notes (Signed)
Subjective:    Patient ID: Kristin [name], female    DOB: 18-May-1946, 71 y.o.   MRN: [id number]  HPI  Chief Complaint  Patient presents with  . Nail Problem    Left, 2nd toe - nail is discolored,thick,long.   . Diabetes    Pt stated she is "borderline diabetic, would like nails trimmed"    71 y.o. female presents with the above complaint. States she is borderline diabetic. Denies cramping in her feet. Endorses numbness and tingling in her feet. States her nails are thick and painful when she wears her shoes.  Past Medical History:  Diagnosis Date  . Acute sinusitis, unspecified   . Breast calcifications   . Cervicalgia   . Chest pain, unspecified   . COPD (chronic obstructive pulmonary disease) ([place])   . Degeneration of cervical intervertebral disc   . Diverticulosis of colon (without mention of hemorrhage)   . Esophageal reflux   . Hyperlipidemia   . IBS (irritable bowel syndrome)   . 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)   . Obstructive chronic bronchitis with exacerbation ([place])   . Personal history of peptic ulcer disease   . Unspecified essential hypertension   . Unspecified hypothyroidism   . Unspecified vitamin D deficiency   . Vitiligo    Past Surgical History:  Procedure Laterality Date  . ABDOMINAL HYSTERECTOMY W/ PARTIAL VAGINACTOMY    . BREAST BIOPSY Right   . BREAST EXCISIONAL BIOPSY Right 1998  . COLONOSCOPY  05/19/2012  . ESOPHAGOGASTRODUODENOSCOPY  01/03/2011   normal   . THYROIDECTOMY, PARTIAL    . TONSILLECTOMY      Current Outpatient Prescriptions:  .  albuterol (PROVENTIL) (2.5 MG/3ML) 0.083% nebulizer solution, USE ONE VIAL IN NEBULIZER EVERY 4 HOURS AS NEEDED FOR  WHEEZING  OR  SHORTNESS  OF  BREATH, Disp: 75 mL, Rfl: 12 .  hydrochlorothiazide (HYDRODIURIL) 25 MG tablet, Take 1 tablet (25 mg total) by mouth daily. Overdue for annual appt w/labs must see provide for refills, Disp: 30 tablet, Rfl: 0 .  Ipratropium-Albuterol (COMBIVENT) 20-100 MCG/ACT AERS  respimat, Inhale 1 puff into the lungs 4 (four) times daily., Disp: 1 Inhaler, Rfl: 5 .  losartan (COZAAR) 50 MG tablet, TAKE 1 TABLET BY MOUTH ONCE DAILY, Disp: 90 tablet, Rfl: 1 .  rosuvastatin (CRESTOR) 5 MG tablet, TAKE ONE TABLET BY MOUTH ONCE DAILY, Disp: 90 tablet, Rfl: 1 .  SYNTHROID 88 MCG tablet, TAKE 1 TABLET BY MOUTH ONCE DAILY BEFORE BREAKFAST, Disp: 90 tablet, Rfl: 0 .  Tiotropium Bromide-Olodaterol (STIOLTO RESPIMAT) 2.5-2.5 MCG/ACT AERS, Inhale 2 puffs into the lungs daily., Disp: 1 Inhaler, Rfl: 5  Allergies  Allergen Reactions  . Symbicort [Budesonide-Formoterol Fumarate] Other (See Comments)    Throat swelling Throat swelling     Review of Systems  Constitutional: Positive for activity change, appetite change, fatigue and unexpected weight change.  HENT: Positive for trouble swallowing.   Eyes: Positive for itching.  Respiratory: Positive for apnea.   Cardiovascular: Positive for palpitations.  Gastrointestinal: Positive for abdominal distention, abdominal pain, constipation and diarrhea.  Endocrine: Positive for cold intolerance and heat intolerance.  Genitourinary: Negative.   Allergic/Immunologic: Positive for food allergies.  Neurological: Positive for weakness.  All other systems reviewed and are negative.     Objective:   Physical Exam Vitals:   07/04/17 0859  BP: (!) 95/57  Pulse: 89   General AA&O x3. Normal mood and affect.  Vascular Dorsalis pedis and posterior tibial pulses  present 2+ bilaterally  Capillary refill normal to all digits. Pedal hair growth diminished.  Neurologic Epicritic sensation present bilaterally. Protective sensation with 5.07 monofilament  present bilaterally. Vibratory sensation present bilaterally.  Dermatologic No open lesions. Interspaces clear of maceration.  Normal skin temperature and turgor. Hyperkeratotic lesions: none bilaterally. Nails: brittle, onychomycosis, thickening, painful to palpation  Orthopedic: No  history of amputation. MMT 5/5 in dorsiflexion, plantarflexion, inversion, and eversion. Normal lower extremity joint ROM without pain or crepitus.     Assessment & Plan:  Onychomycosis with Pain -Educated on etiology of nail fungus -Nails debrided as below. -Educated on self-care  Procedure: Nail Debridement Rationale: pain in toenails Type of Debridement: manual, sharp debridement. Instrumentation: Nail nipper Number of Nails: 10  Return if symptoms worsen or fail to improve.

## 2017-07-08 ENCOUNTER — Ambulatory Visit: Payer: Medicare HMO | Admitting: Neurology

## 2017-07-17 ENCOUNTER — Ambulatory Visit: Payer: Medicare HMO | Admitting: Neurology

## 2017-07-19 DIAGNOSIS — J449 Chronic obstructive pulmonary disease, unspecified: Secondary | ICD-10-CM | POA: Diagnosis not present

## 2017-08-05 ENCOUNTER — Other Ambulatory Visit: Payer: Self-pay | Admitting: Family

## 2017-08-05 DIAGNOSIS — I10 Essential (primary) hypertension: Secondary | ICD-10-CM

## 2017-08-12 ENCOUNTER — Encounter: Payer: Self-pay | Admitting: Neurology

## 2017-08-12 ENCOUNTER — Ambulatory Visit (INDEPENDENT_AMBULATORY_CARE_PROVIDER_SITE_OTHER): Payer: Medicare HMO | Admitting: Neurology

## 2017-08-12 VITALS — BP 100/70 | HR 60 | Ht 66.0 in | Wt 91.5 lb

## 2017-08-12 DIAGNOSIS — G992 Myelopathy in diseases classified elsewhere: Secondary | ICD-10-CM

## 2017-08-12 DIAGNOSIS — E610 Copper deficiency: Secondary | ICD-10-CM

## 2017-08-12 DIAGNOSIS — G959 Disease of spinal cord, unspecified: Secondary | ICD-10-CM

## 2017-08-12 DIAGNOSIS — E60 Dietary zinc deficiency: Secondary | ICD-10-CM | POA: Diagnosis not present

## 2017-08-12 DIAGNOSIS — E46 Unspecified protein-calorie malnutrition: Secondary | ICD-10-CM

## 2017-08-12 DIAGNOSIS — G1220 Motor neuron disease, unspecified: Secondary | ICD-10-CM

## 2017-08-12 NOTE — Patient Instructions (Addendum)
Start zinc 10-20 drops daily Start copper 10-30 drops daily Try adding 1-2 cans of ensure/Boost to your diet daily  Return to clinic in 4 months

## 2017-08-12 NOTE — Progress Notes (Signed)
Follow-up Visit   Date: 08/12/17    Lanina Larranaga MRN: [id number] DOB: 10/14/1946   Interim History: Kristin [name] is a 71 y.o. 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   History of present illness: Starting around 2011, she developed gradual and progressive difficulty swallowing with associated weight loss. She transitioned to a soft food diet and eats yogurt, ice cream, and soups. She tries to drink ensure, but developed abdominal pain. She has mild weakness of the left arm because she is unable to lift heavy objects as well as previously. She denies any slurring of speech, muscle twitches, or cramps. She endorses shortness of breath with exertion. She sleeps on 3-pillows since 2013.   She has been evaluated by ENT and GI with negative workup.  CT soft tissue neck was unremarkable, there is near total thyroidectomy with some residual gland on the right and posteriorly, no significant cervical adenopathy, advanced cervical spondylosis. Modified barium swallow was ordered, however patient was unable to complete the study due to intolerance of testing and inability to swallow.   She was seen by my colleague, Dr. Delice Lesch, who ordered electrodiagnostic testing and labs. 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 She was referred to me for ongoing management in June 2015.  As part of her motor neuron disease work-up, labs were checked and showed markedly low copper and zinc levels. However, due to GI intolerance, she did not take supplements.  We discussed role of PEG for nutrition, but she did not want this unless as a last option.   - UPDATE 03/26/2017:  She returned for follow-up at the  recommendation of her surgeon, Dr. Marlou Starks, because of ongoing weight loss and weakness.  She was last seen in April 2016 and recommended to take zinc and copper supplements to help differentiate between vitamin deficiency causing myeloneuropathy vs. ALS; however, she was lost to follow-up and did not take supplements.    As part of her breast cancer screening, she was found to have left breast calcification and was recommended to have a biopsy by general surgery.  She did not wish to have surgery because she does not tolerate anesthesia.  Since her last visit, she has lost 19lb and has greater difficulty finding soft foods that she can tolerate.  She has been sleeping in a recliner for the past few weeks because of shortness of breath and GERD.  She also complains of cramps in the hands and feet, but denies any muscle twitches.   UPDATE 08/12/2017:  She is here for follow-up visit.  Over the past 3 months, she has not noticed any new neurological changes.  Speech and swallow is stable.  She was able to eat at egg and cheese biscuit over the weekend without difficulty, which was new and her weight is up about 2lb.  She was able to find liquid supplements of zinc and copper online, but has not started taking any because she wanted to get my opinion.  She denies any muscle twitches or weakness.  She continues to walk unassisted and has not suffered any falls. She is complaining of ongoing abdominal pain and discomfort and sees gastroenterology for this.  Medications:  Current Outpatient Prescriptions on File Prior to Visit  Medication Sig Dispense Refill  . albuterol (  PROVENTIL) (2.5 MG/3ML) 0.083% nebulizer solution USE ONE VIAL IN NEBULIZER EVERY 4 HOURS AS NEEDED FOR  WHEEZING  OR  SHORTNESS  OF  BREATH 75 mL 12  . hydrochlorothiazide (HYDRODIURIL) 25 MG tablet TAKE 1 TABLET BY MOUTH ONCE DAILY (OVERDUE  FOR  ANNUAL  APPOINTMENT  WITH  LABS.  MUST  SEE  PROVIDER  FOR  REFILLS) 30 tablet 0  .  Ipratropium-Albuterol (COMBIVENT) 20-100 MCG/ACT AERS respimat Inhale 1 puff into the lungs 4 (four) times daily. 1 Inhaler 5  . losartan (COZAAR) 50 MG tablet TAKE 1 TABLET BY MOUTH ONCE DAILY 90 tablet 1  . rosuvastatin (CRESTOR) 5 MG tablet TAKE ONE TABLET BY MOUTH ONCE DAILY 90 tablet 1  . SYNTHROID 88 MCG tablet TAKE 1 TABLET BY MOUTH ONCE DAILY BEFORE BREAKFAST 90 tablet 0  . Tiotropium Bromide-Olodaterol (STIOLTO RESPIMAT) 2.5-2.5 MCG/ACT AERS Inhale 2 puffs into the lungs daily. 1 Inhaler 5   No current facility-administered medications on file prior to visit.     Allergies:  Allergies  Allergen Reactions  . Symbicort [Budesonide-Formoterol Fumarate] Other (See Comments)    Throat swelling Throat swelling     Review of Systems:  CONSTITUTIONAL: No fevers, chills, night sweats, +2 lb weight gain EYES: No visual changes or eye pain ENT: No hearing changes.  No history of nose bleeds.   RESPIRATORY: No cough, wheezing +shortness of breath.   CARDIOVASCULAR: Negative for chest pain, and palpitations.   GI: +for abdominal discomfort, blood in stools or black stools.  No recent change in bowel habits.   GU:  No history of incontinence.   MUSCLOSKELETAL: No history of joint pain or swelling.  No myalgias.   SKIN: Negative for lesions, rash, and itching.   ENDOCRINE: Negative for cold or heat intolerance, polydipsia or goiter.   PSYCH:  No depression or anxiety symptoms.   NEURO: As Above.   Vital Signs:  BP 100/70   Pulse 60   Ht 5' 6"  (1.676 m)   Wt 91 lb 8 oz (41.5 kg)   BMI 14.77 kg/m    General:  Emaciated, comfortable  Neurological Exam: MENTAL STATUS including orientation to time, place, person is normal.  Speech is mildly dysarthric with labial and lingual sounds.   CRANIAL NERVES:  Pupils equal round and reactive to light.  Normal conjugate, extra-ocular eye movements in all directions of gaze.  No ptosis. Face is symmetric and facial muscles are intact.  Tongue  strength is normal. Snout is present (L>R).  No tongue fasciculations. There is evidence of thrush.  MOTOR: Muscle bulk is severely reduced throughout. No fasciculations.  Neck flexion is 5/5 *improved Right Upper Extremity:    Left Upper Extremity:    Deltoid  5/5   Deltoid  5/5   Biceps  5/5   Biceps  5/5   Triceps  5/5   Triceps  5-/5   Medial pectoralis * 5-/5   Medial pectoralis * 5-/5   Infraspinatus  5-/5   Infraspinatus  4/5   Wrist extensors  5/5   Wrist extensors  5/5   Wrist flexors  5/5   Wrist flexors  5/5   Finger extensors  5/5   Finger extensors  5/5   Finger flexors  5/5   Finger flexors  5/5   Dorsal interossei  5/5   Dorsal interossei  5/5   Abductor pollicis  5/5   Abductor pollicis  5/5   Tone (Ashworth scale)  0   Tone (  Ashworth scale)  0    Right Lower Extremity:    Left Lower Extremity:    Hip flexors * 5/5   Hip flexors * 5/5   Hip extensors  5/5   Hip extensors  5/5   Knee flexors  5/5   Knee flexors  5/5   Knee extensors  5/5   Knee extensors  5/5   Dorsiflexors  4+/5   Dorsiflexors  4+/5   Plantarflexors  5/5   Plantarflexors  5/5   Toe extensors  5/5   Toe extensors  5/5   Toe flexors  4+/5   Toe flexors  4+/5   Tone (Ashworth scale)  0   Tone (Ashworth scale)  0   *improved MSRs:  Right      Left  brachioradialis  3+   brachioradialis  3+   biceps  3+   biceps  3+   triceps  3+   triceps  2+   patellar  2+   patellar  3+   ankle jerk  1+   ankle jerk  1+   Hoffman  no   Hoffman  no   plantar response  down   plantar response  down    GAIT:  She is able to stand from low chair without pushing off with arms. Gait narrow-based and stable.  Data: MRI brain and cervical spine 04/13/2014: Mild chronic microvascular ischemic change in the white matter. No acute intracranial abnormality.  Moderate to advanced cervical spondylosis. Central disc protrusions at C3-4 and C4-5 and C5-6 causing spinal stenosis and foraminal encroachment. This is most severe  at C3-4 and C4-5.   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   Labs 04/02/19/15: CK 240*, ANA positive (1:40), CRP <0.5, ESR 7, SCL 70 - negative, SSA/B negative, RF negative Labs 04/20/2014:  GM1 antibody neg, SPEP/UPEP with IFE no M protein, vitamin B12 287, copper 5*, zinc 43*   IMPRESSION: Mrs. 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  Clinically, she continues to have mild dysarthria, with labial sounds.  The strength of her medial pectoralis and hip flexors has improved since her previous visit.  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  She will need to followed closely for progressive weakness.  If she develops new weakness, repeat NCS/EMG of the left side to assess.  I encouraged her to start zinc and copper supplements.  She has not tolerated PO supplementation in the past due to GI upset and will try her best at taking at least 15-53m zinc and 2-443mcopper daily.   She is not interested in PEG.  Continued to encourage her to increase caloric intake by mouth.  She will be seeing Dr. ToMarlou Starksegarding her left breast excisional biopsy, which patient is willing to consider now.  I explained that malignancy evaluation is important as motor neuron disease can manifest as paraneoplastic disease.   Return to clinic in 3 month   Greater than 50% of this 30 minute visit was spent in counseling, explanation of diagnosis, planning of further management, and coordination of care.   Thank  you for allowing me to participate in  patient's care.  If I can answer any additional questions, I would be pleased to do so.    Sincerely,    Donika K. Posey Pronto, DO

## 2017-08-15 ENCOUNTER — Ambulatory Visit: Payer: Self-pay | Admitting: General Surgery

## 2017-08-15 DIAGNOSIS — R921 Mammographic calcification found on diagnostic imaging of breast: Secondary | ICD-10-CM

## 2017-08-15 DIAGNOSIS — N6323 Unspecified lump in the left breast, lower outer quadrant: Secondary | ICD-10-CM | POA: Diagnosis not present

## 2017-08-18 DIAGNOSIS — J449 Chronic obstructive pulmonary disease, unspecified: Secondary | ICD-10-CM | POA: Diagnosis not present

## 2017-08-19 ENCOUNTER — Telehealth: Payer: Self-pay | Admitting: Emergency Medicine

## 2017-08-19 MED ORDER — TIOTROPIUM BROMIDE-OLODATEROL 2.5-2.5 MCG/ACT IN AERS: 2.0000 | INHALATION_SPRAY | Freq: Every day | RESPIRATORY_TRACT | 0 refills | Status: DC

## 2017-08-19 NOTE — Telephone Encounter (Signed)
Spoke with patient. She is requesting samples of Stiolto. Advised patient that I would leave 2 samples up front for her. She verbalized understanding. Nothing else needed at time of call.

## 2017-08-21 ENCOUNTER — Other Ambulatory Visit: Payer: Self-pay | Admitting: General Surgery

## 2017-08-21 DIAGNOSIS — R921 Mammographic calcification found on diagnostic imaging of breast: Secondary | ICD-10-CM

## 2017-08-26 ENCOUNTER — Encounter (HOSPITAL_COMMUNITY): Payer: Self-pay | Admitting: Emergency Medicine

## 2017-08-26 NOTE — Pre-Procedure Instructions (Signed)
[facility]  08/26/2017      [place], [place] - [address] [place] [address] Phone: [phone] Fax: [phone]  [facility] - [place], [place] [address] [place] [address] Phone: [phone] Fax: [phone]    Your procedure is scheduled on November 2  Report to [place] at 1000 A.M.  Call this number if you have problems the morning of surgery:  [phone]   Remember:  Do not eat food or drink liquids after midnight.  Continue all other medications as directed by your physician except follow these medication instructions before surgery  Please complete your 8oz of Boost Breeze or Water that was given to you at your preadmission appointment by 0800am on the DAY OF YOUR SURGERY   Take these medicines the morning of surgery with A SIP OF WATER  albuterol (PROVENTIL) (2.5 MG/3ML)  If needed SYNTHROID Tiotropium Bromide-Olodaterol (STIOLTO RESPIMAT)  7 days prior to surgery STOP taking any Aspirin (unless otherwise instructed by your surgeon), Aleve, Naproxen, Ibuprofen, Motrin, Advil, Goody's, BC's, all herbal medications, fish oil, and all vitamins    Do not wear jewelry, make-up or nail polish.  Do not wear lotions, powders, or perfumes, or deoderant.  Do not shave 48 hours prior to surgery.  Men may shave face and neck.  Do not bring valuables to the hospital.  [facility] is not responsible for any belongings or valuables.  Contacts, dentures or bridgework may not be worn into surgery.  Leave your suitcase in the car.  After surgery it may be brought to your room.  For patients admitted to the hospital, discharge time will be determined by your treatment team.  Patients discharged the day of surgery will not be allowed to drive home.    Special instructions:   [facility]- Preparing For Surgery  Before  surgery, you can play an important role. Because skin is not sterile, your skin needs to be as free of germs as possible. You can reduce the number of germs on your skin by washing with CHG (chlorahexidine gluconate) Soap before surgery.  CHG is an antiseptic cleaner which kills germs and bonds with the skin to continue killing germs even after washing.  Please do not use if you have an allergy to CHG or antibacterial soaps. If your skin becomes reddened/irritated stop using the CHG.  Do not shave (including legs and underarms) for at least 48 hours prior to first CHG shower. It is OK to shave your face.  Please follow these instructions carefully.   1. Shower the NIGHT BEFORE SURGERY and the MORNING OF SURGERY with CHG.   2. If you chose to wash your hair, wash your hair first as usual with your normal shampoo.  3. After you shampoo, rinse your hair and body thoroughly to remove the shampoo.  4. Use CHG as you would any other liquid soap. You can apply CHG directly to the skin and wash gently with a scrungie or a clean washcloth.   5. Apply the CHG Soap to your body ONLY FROM THE NECK DOWN.  Do not use on open wounds or open sores. Avoid contact with your eyes, ears, mouth and genitals (private parts). Wash Face and genitals (private parts)  with your normal soap.  6. Wash thoroughly, paying special attention to the area where your surgery will be performed.  7. Thoroughly rinse your body  with warm water from the neck down.  8. DO NOT shower/wash with your normal soap after using and rinsing off the CHG Soap.  9. Pat yourself dry with a CLEAN TOWEL.  10. Wear CLEAN PAJAMAS to bed the night before surgery, wear comfortable clothes the morning of surgery  11. Place CLEAN SHEETS on your bed the night of your first shower and DO NOT SLEEP WITH PETS.    Day of Surgery: Do not apply any deodorants/lotions. Please wear clean clothes to the hospital/surgery center.      Please read over  the following fact sheets that you were given.

## 2017-08-27 ENCOUNTER — Encounter (HOSPITAL_COMMUNITY)
Admission: RE | Admit: 2017-08-27 | Discharge: 2017-08-27 | Disposition: A | Discharge status: Home / Self Care | Payer: Medicare HMO | Source: Ambulatory Visit | Attending: General Surgery | Admitting: General Surgery

## 2017-08-27 ENCOUNTER — Encounter (HOSPITAL_COMMUNITY): Payer: Self-pay

## 2017-08-27 DIAGNOSIS — E785 Hyperlipidemia, unspecified: Secondary | ICD-10-CM | POA: Diagnosis not present

## 2017-08-27 DIAGNOSIS — R921 Mammographic calcification found on diagnostic imaging of breast: Secondary | ICD-10-CM | POA: Insufficient documentation

## 2017-08-27 DIAGNOSIS — I10 Essential (primary) hypertension: Secondary | ICD-10-CM | POA: Insufficient documentation

## 2017-08-27 DIAGNOSIS — R7303 Prediabetes: Secondary | ICD-10-CM | POA: Diagnosis not present

## 2017-08-27 DIAGNOSIS — F17200 Nicotine dependence, unspecified, uncomplicated: Secondary | ICD-10-CM | POA: Insufficient documentation

## 2017-08-27 DIAGNOSIS — Z01818 Encounter for other preprocedural examination: Secondary | ICD-10-CM | POA: Diagnosis not present

## 2017-08-27 DIAGNOSIS — Z79899 Other long term (current) drug therapy: Secondary | ICD-10-CM | POA: Insufficient documentation

## 2017-08-27 DIAGNOSIS — J449 Chronic obstructive pulmonary disease, unspecified: Secondary | ICD-10-CM | POA: Insufficient documentation

## 2017-08-27 DIAGNOSIS — I4581 Long QT syndrome: Secondary | ICD-10-CM | POA: Insufficient documentation

## 2017-08-27 DIAGNOSIS — E039 Hypothyroidism, unspecified: Secondary | ICD-10-CM | POA: Insufficient documentation

## 2017-08-27 DIAGNOSIS — G1220 Motor neuron disease, unspecified: Secondary | ICD-10-CM | POA: Diagnosis not present

## 2017-08-27 HISTORY — DX: Other complications of anesthesia, initial encounter: T88.59XA

## 2017-08-27 HISTORY — DX: Prediabetes: R73.03

## 2017-08-27 HISTORY — DX: Pneumonia, unspecified organism: J18.9

## 2017-08-27 HISTORY — DX: Adverse effect of unspecified anesthetic, initial encounter: T41.45XA

## 2017-08-27 LAB — CBC
HCT: 41.5 % (ref 36.0–46.0)
Hemoglobin: 13.8 g/dL (ref 12.0–15.0)
MCH: 30.9 pg (ref 26.0–34.0)
MCHC: 33.3 g/dL (ref 30.0–36.0)
MCV: 93 fL (ref 78.0–100.0)
PLATELETS: 236 10*3/uL (ref 150–400)
RBC: 4.46 MIL/uL (ref 3.87–5.11)
RDW: 13.3 % (ref 11.5–15.5)
WBC: 3.7 10*3/uL — AB (ref 4.0–10.5)

## 2017-08-27 LAB — BASIC METABOLIC PANEL
ANION GAP: 8 (ref 5–15)
BUN: <5 mg/dL — ABNORMAL LOW (ref 6–20)
CO2: 31 mmol/L (ref 22–32)
Calcium: 9.5 mg/dL (ref 8.9–10.3)
Chloride: 95 mmol/L — ABNORMAL LOW (ref 101–111)
Creatinine, Ser: 0.67 mg/dL (ref 0.44–1.00)
GFR calc Af Amer: >60 mL/min (ref >60)
Glucose, Bld: 102 mg/dL — ABNORMAL HIGH (ref 65–99)
POTASSIUM: 3.8 mmol/L (ref 3.5–5.1)
SODIUM: 134 mmol/L — AB (ref 135–145)

## 2017-08-27 LAB — HEMOGLOBIN A1C
HEMOGLOBIN A1C: 6.2 % — AB (ref 4.8–5.6)
Mean Plasma Glucose: 131.24 mg/dL

## 2017-08-27 NOTE — Progress Notes (Signed)
PCP - Mauricio Po Cardiologist - denies  Chest x-ray - not needed EKG - 08/27/17 Stress Test - 2011 ECHO - denies Cardiac Cath - denies  Per patient she is pre-diabetic, checking a1c    Patient denies shortness of breath, fever, cough and chest pain at PAT appointment   Patient verbalized understanding of instructions that were given to them at the PAT appointment. Patient was also instructed that they will need to review over the PAT instructions again at home before surgery.

## 2017-08-28 ENCOUNTER — Telehealth: Payer: Self-pay | Admitting: Emergency Medicine

## 2017-08-28 NOTE — Telephone Encounter (Signed)
RB the NP with anesthesia at [facility] called---Kristin [name] and wanted to know if you have any recs.concers for the pt having anesthesia for her breast cancer surgery on 11/2?  Please advise. Thanks

## 2017-08-28 NOTE — Progress Notes (Signed)
Anesthesia Chart Review:  Pt is a 71 year old female scheduled for L breast lumpectomy with radioactive seed localization on 08/30/2017 with Jovita Kussmaul, MD  - PCP is Mauricio Po, NP - Pulmonologist is Baltazar Apo, MD - Neurologist is Narda Amber, MD who follows patient for copper deficiency myelopathy, motor neuron disease, zinc deficiency. Dr. Posey Pronto is aware of upcoming procedure.   PMH includes:  Severe COPD, HTN, hyperlipidemia, pre-diabetes, hypothyroidism, motor neuron disease. Current smoker. BMI 15  Medications include: albuterol, hctz, combivent, losartan, rosuvastatin, synthorid, stiolto respimat  BP 111/63   Pulse 99   Temp 36.6 C   Resp 18   Ht 5\' 6"  (1.676 m)   Wt 93 lb 9.6 oz (42.5 kg)   SpO2 99%   BMI 15.11 kg/m   Preoperative labs reviewed.  HbA1c 6.2, glucose 102  CXR 03/11/17:  - Severe bullous emphysema.  No acute pneumonia nor CHF. - Nodular density that projects most likely in the left lower lobe. Given the this radiographic finding and the patient's smoking history, chest CT scanning is recommended to exclude malignancy. - Thoracic aortic atherosclerosis.  CT chest 04/05/17:  1. Lingular nodule measures 6 mm. Non-contrast chest CT at 6-12 months is recommended. If the nodule is stable at time of repeat CT, then future CT at 18-24 months (from today's scan) is recommended for high-risk patients.  2. Aortic Atherosclerosis and Emphysema   EKG 08/27/17:  - NSR. Rightward axis. Nonspecific ST and T wave abnormality. Prolonged QT. noise artifact  Given pt's severe COPD, I reached out to Dr. Lamonte Sakai for input regarding anesthesia for this pt.  He responded "patient is at increased risk for complications due to her severe COPD, but that there is nothing to preclude a procedure AS LONG AS  She is not experiencing an active exacerbation (ie wheeze, increased SOB, increased sputum, etc)."  Pt denied acute sx at pre-admission testing.    If no changes, I anticipate  pt can proceed with surgery as scheduled.   Willeen Cass, FNP-BC [facility] Short Stay Surgical Center/Anesthesiology Phone: [phone] 08/28/2017 2:40 PM

## 2017-08-28 NOTE — Telephone Encounter (Signed)
Please let them know that patient is at increased risk for complications due to her severe COPD, but that there is nothing to preclude a procedure AS LONG AS  She is not experiencing an active exacerbation (ie wheeze, increased SOB, increased sputum, etc).

## 2017-08-28 NOTE — Telephone Encounter (Signed)
Called and spoke with Kristin [name] and she did see the response that was received from [place].  Nothing further is needed.

## 2017-08-29 ENCOUNTER — Ambulatory Visit
Admission: RE | Admit: 2017-08-29 | Discharge: 2017-08-29 | Disposition: A | Discharge status: Home / Self Care | Payer: Medicare HMO | Source: Ambulatory Visit | Attending: General Surgery | Admitting: General Surgery

## 2017-08-29 ENCOUNTER — Other Ambulatory Visit: Payer: Self-pay | Admitting: General Surgery

## 2017-08-29 DIAGNOSIS — N6320 Unspecified lump in the left breast, unspecified quadrant: Secondary | ICD-10-CM

## 2017-08-29 DIAGNOSIS — R921 Mammographic calcification found on diagnostic imaging of breast: Secondary | ICD-10-CM

## 2017-08-29 DIAGNOSIS — D0512 Intraductal carcinoma in situ of left breast: Secondary | ICD-10-CM | POA: Diagnosis not present

## 2017-08-29 DIAGNOSIS — C50512 Malignant neoplasm of lower-outer quadrant of left female breast: Secondary | ICD-10-CM | POA: Diagnosis not present

## 2017-08-29 DIAGNOSIS — R922 Inconclusive mammogram: Secondary | ICD-10-CM | POA: Diagnosis not present

## 2017-08-30 ENCOUNTER — Encounter (HOSPITAL_COMMUNITY): Admission: RE | Payer: Self-pay | Source: Ambulatory Visit

## 2017-08-30 ENCOUNTER — Ambulatory Visit: Payer: Self-pay | Admitting: General Surgery

## 2017-08-30 ENCOUNTER — Ambulatory Visit (HOSPITAL_COMMUNITY): Admission: RE | Admit: 2017-08-30 | Payer: Medicare HMO | Source: Ambulatory Visit | Admitting: General Surgery

## 2017-08-30 DIAGNOSIS — C50512 Malignant neoplasm of lower-outer quadrant of left female breast: Secondary | ICD-10-CM | POA: Diagnosis not present

## 2017-08-30 SURGERY — BREAST LUMPECTOMY WITH RADIOACTIVE SEED LOCALIZATION
Anesthesia: General | Laterality: Left

## 2017-09-02 ENCOUNTER — Telehealth: Payer: Self-pay | Admitting: Emergency Medicine

## 2017-09-02 MED ORDER — TIOTROPIUM BROMIDE-OLODATEROL 2.5-2.5 MCG/ACT IN AERS: 2.0000 | INHALATION_SPRAY | Freq: Every day | RESPIRATORY_TRACT | 0 refills | Status: DC

## 2017-09-02 NOTE — Telephone Encounter (Signed)
Spoke with patient. She stated that her insurance company will not pay for the [place] until next month. She wanted to know if she could go back on the Combivent until then. Advised patient that if the Stiolto was working well for her, I would leave 2 samples up for her. She preferred the samples.   Samples have been placed up front. Patient is aware. Nothing else needed at time of call.

## 2017-09-04 ENCOUNTER — Encounter: Payer: Self-pay | Admitting: Radiation Oncology

## 2017-09-06 ENCOUNTER — Telehealth: Payer: Self-pay | Admitting: Emergency Medicine

## 2017-09-06 ENCOUNTER — Other Ambulatory Visit: Payer: Self-pay

## 2017-09-06 DIAGNOSIS — I10 Essential (primary) hypertension: Secondary | ICD-10-CM

## 2017-09-06 NOTE — Telephone Encounter (Signed)
Spoke with patient. She stated that someone from our office had called to get her setup for a CXR for next month. Looked in patient's chart and saw where one of the PCCs had called and got the patient setup for a CT chest w/o contrast for 12/11 at 10am.   Patient is confused because she recently found out she has breast cancer and will need to have surgery on 11/19.   She wants to know if she really needs this CT scan or can this be put off to a later date. Advised her I would send a message over to [facility], please advise. Thanks.

## 2017-09-08 DIAGNOSIS — C50512 Malignant neoplasm of lower-outer quadrant of left female breast: Secondary | ICD-10-CM | POA: Insufficient documentation

## 2017-09-09 ENCOUNTER — Ambulatory Visit: Payer: Medicare HMO | Admitting: Hematology

## 2017-09-09 ENCOUNTER — Telehealth: Payer: Self-pay | Admitting: Emergency Medicine

## 2017-09-09 ENCOUNTER — Encounter: Payer: Self-pay | Admitting: Hematology

## 2017-09-09 ENCOUNTER — Encounter: Payer: Self-pay | Admitting: *Deleted

## 2017-09-09 DIAGNOSIS — E039 Hypothyroidism, unspecified: Secondary | ICD-10-CM

## 2017-09-09 DIAGNOSIS — J449 Chronic obstructive pulmonary disease, unspecified: Secondary | ICD-10-CM | POA: Diagnosis not present

## 2017-09-09 DIAGNOSIS — Z170 Estrogen receptor positive status [ER+]: Secondary | ICD-10-CM

## 2017-09-09 DIAGNOSIS — C50512 Malignant neoplasm of lower-outer quadrant of left female breast: Secondary | ICD-10-CM

## 2017-09-09 NOTE — Progress Notes (Addendum)
[place]  [phone]) [phone] Fax:([phone]) [place] Note   Patient Care Team: Golden Circle, FNP as PCP - General (Family Medicine) Jovita Kussmaul, MD as Consulting Physician (General Surgery) Truitt Merle, MD as Consulting Physician (Hematology) Alla Feeling, NP as Nurse Practitioner (Nurse Practitioner) 09/09/2017  Referring physician: Dr. Marlou Starks   CHIEF COMPLAINTS/PURPOSE OF CONSULTATION:  Left breast cancer  Oncology History   Cancer Staging No matching staging information was found for the patient.        Breast cancer of lower-outer quadrant of left female breast ([place])   08/29/2017 Mammogram    Diagnostic mammogram 08/29/17 Irregular hypoechoic mass abutting the skin and the chest wall measuring 1.3 x 0.9 x 1.1 cm at the left breast 4 o'clock 3 cm from nipple. Ultrasound of the left axilla demonstrate several abnormal thickened cortex left axillary lymph nodes.      08/29/2017 Initial Biopsy    Diagnosis 08/29/17 Breast, left, needle core biopsy, 4:00 o'clock mass - INVASIVE DUCTAL CARCINOMA WITH ASSOCIATED CALCIFICATIONS. - DUCTAL CARCINOMA IN SITU. - SEE COMMENT.      08/29/2017 Receptors her2    Estrogen Receptor: 100%, POSITIVE, STRONG STAINING INTENSITY Progesterone Receptor: 70%, POSITIVE, STRONG STAINING INTENSITY Proliferation Marker Ki67: 10% HER2: NEGATIVE      08/29/2017 Initial Diagnosis    Breast cancer of lower-outer quadrant of left female breast ([place])       HISTORY OF PRESENTING ILLNESS:  Kristin [name] 71 y.o. female is here because of recently diagnosed left breast cancer. She was referred by Dr. Marlou Starks. She was diagnosed with left breast calcification 15 years ago, had been getting regular screening mammograms since then. The recent mass was detected on screening mammogram then she was able to palpate the mass after it was known to her. Over the last 10 years she has had progressive fatigue, weight  loss, decreased appetite, solid and liquid dysphagia. Usual weight is approx 120 pounds. She is on a soft diet. 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 She has mild left sided weakness but overall functional and completely independent. She was prescribed zinc and copper supplementation but could not tolerate due to GI side effects. She fluctuates with constipation and diarrhea with intermittent GERD symptoms.   Past medical history includes arthritis, GERD, HTN, hypothyroidism s/p thyroidectomy, IBS, motor neuron disease, COPD, and pre-DM. She denies renal disease, stroke, or CAD. She lives alone, has 3 adult children one of whom lives close by. Her father's cousin has breast and ovarian cancer; no other family history of cancer. She has social support. She drives her self if necessary, was brought by a friend to today's consult. She is a current every day smoker for approx 53 years, has COPD without recent exacerbation. 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 Hysterectomy with partial vaginectomy  Contraceptive: Oral Contraception  HRT: None  G3P3   MEDICAL HISTORY:  Past Medical History:  Diagnosis Date  . Acute sinusitis, unspecified   . Breast calcifications   . Breast cancer ([place])   . Cervicalgia   . Chest pain, unspecified   . Complication of anesthesia    hard time waking up - cant remember how long ago  . COPD (chronic obstructive pulmonary disease) ([place])   . Degeneration of  cervical intervertebral disc   . Diverticulosis of colon (without mention of hemorrhage)   . Esophageal reflux   . Hyperlipidemia   . IBS (irritable bowel syndrome)   . Motor neuron disease ([place])   . Neuromuscular disorder ([place])    motor neuron disease  . Obstructive chronic bronchitis with exacerbation ([place])   .  Personal history of peptic ulcer disease   . Pneumonia    hx  . Pre-diabetes    per patient  . Unspecified essential hypertension   . Unspecified hypothyroidism   . Unspecified vitamin D deficiency   . Vitiligo     SURGICAL HISTORY: Past Surgical History:  Procedure Laterality Date  . ABDOMINAL HYSTERECTOMY W/ PARTIAL VAGINACTOMY    . BREAST BIOPSY Right   . BREAST EXCISIONAL BIOPSY Right 1998  . COLONOSCOPY  05/19/2012  . ESOPHAGOGASTRODUODENOSCOPY  01/03/2011   normal   . MULTIPLE TOOTH EXTRACTIONS    . THYROIDECTOMY, PARTIAL    . TONSILLECTOMY      SOCIAL HISTORY: Social History   Socioeconomic History  . Marital status: Divorced    Spouse name: Not on file  . Number of children: 3  . Years of education: 33+  . Highest education level: Doctorate  Social Needs  . Financial resource strain: Not on file  . Food insecurity - worry: Not on file  . Food insecurity - inability: Not on file  . Transportation needs - medical: Not on file  . Transportation needs - non-medical: Not on file  Occupational History  . Occupation: Retired    Comment: Engineer, civil (consulting) at [employer]: doctorate in religious studies  Tobacco Use  . Smoking status: Current Every Day Smoker    Packs/day: 0.50    Years: 53.00    Pack years: 26.50    Types: Cigarettes  . Smokeless tobacco: Never Used  . Tobacco comment: 1-2 cigarettes a day  Substance and Sexual Activity  . Alcohol use: No    Alcohol/week: 0.0 oz  . Drug use: No  . Sexual activity: No    Partners: Male  Other Topics Concern  . Not on file  Social History Narrative   She lives alone.  She moved to [place] in 1998.  She has three grown children (2 sons, 1 daughter).  One son lives in [place].   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     FAMILY HISTORY: Family History  Problem Relation  Age of Onset  . Diabetes Mother        everyone on both sides  . Hypertension Mother        everyone on both sides  . Heart attack Mother   . Heart attack Maternal Grandmother   . Diabetes Maternal Grandmother   . Hypertension Maternal Grandmother   . Diabetes Maternal Grandfather   . Hypertension Maternal Grandfather   . Diabetes Paternal Grandmother   . Hypertension Paternal Grandmother   . Diabetes Paternal Grandfather   . Hypertension Paternal Grandfather   . Cancer Cousin 60       father's first cousin; breast and ovarian  . Colon cancer Neg Hx   . Stomach cancer Neg Hx   . Esophageal cancer Neg Hx   . Rectal cancer Neg Hx     ALLERGIES:  is allergic to symbicort [budesonide-formoterol fumarate].  MEDICATIONS:  Current Outpatient Medications  Medication  Sig Dispense Refill  . hydrochlorothiazide (HYDRODIURIL) 25 MG tablet TAKE 1 TABLET BY MOUTH ONCE DAILY (OVERDUE  FOR  ANNUAL  APPOINTMENT  WITH  LABS.  MUST  SEE  PROVIDER  FOR  REFILLS) (Patient taking differently: Take 25 mg by mouth once daily) 30 tablet 0  . losartan (COZAAR) 50 MG tablet TAKE 1 TABLET BY MOUTH ONCE DAILY (Patient taking differently: TAKE 50 MG BY MOUTH ONCE DAILY) 90 tablet 1  . rosuvastatin (CRESTOR) 5 MG tablet TAKE ONE TABLET BY MOUTH ONCE DAILY (Patient taking differently: TAKE 5 MG BY MOUTH ONCE DAILY) 90 tablet 1  . SYNTHROID 88 MCG tablet TAKE 1 TABLET BY MOUTH ONCE DAILY BEFORE BREAKFAST (Patient taking differently: TAKE 88 MCG BY MOUTH ONCE DAILY BEFORE BREAKFAST) 90 tablet 0  . Tiotropium Bromide-Olodaterol (STIOLTO RESPIMAT) 2.5-2.5 MCG/ACT AERS Inhale 2 puffs into the lungs daily. 1 Inhaler 5  . albuterol (PROVENTIL) (2.5 MG/3ML) 0.083% nebulizer solution USE ONE VIAL IN NEBULIZER EVERY 4 HOURS AS NEEDED FOR  WHEEZING  OR  SHORTNESS  OF  BREATH (Patient not taking: Reported on 09/09/2017) 75 mL 12   No current facility-administered medications for this visit.     REVIEW OF SYSTEMS:     Constitutional: Denies fevers, chills or abnormal night sweats (+) fatigue (+) weight loss (+) decreased appetite Eyes: Denies blurriness of vision, double vision or watery eyes Ears, nose, mouth, throat, and face: Denies mucositis or sore throat Respiratory: Denies cough or wheezes (+) dyspnea with minimal exertion; cannot walk a flight of stairs without stopping Cardiovascular: Denies palpitation, chest discomfort or lower extremity swelling Gastrointestinal:  Denies nausea, heartburn or change in bowel habits (+) IBS, alternates b/w constipation and diarrhea  Skin: Denies abnormal skin rashes Lymphatics: Denies new lymphadenopathy or easy bruising Neurological:Denies numbness, tingling or new weaknesses  Behavioral/Psych: Mood is stable, no new changes  Breasts: (+) left breast mass s/p biopsy 08/29/17 All other systems were reviewed with the patient and are negative.  PHYSICAL EXAMINATION: ECOG PERFORMANCE STATUS: 2 - Symptomatic, <50% confined to bed  Vitals:   09/09/17 1132  BP: 106/65  Pulse: 88  Resp: 16  Temp: 98.3 F (36.8 C)  SpO2: 92%   Filed Weights   09/09/17 1132  Weight: 94 lb 4.8 oz (42.8 kg)    GENERAL:alert, no distress and comfortable; cachetic  SKIN: skin color, texture, turgor are normal, no rashes or significant lesions EYES: normal, conjunctiva are pink and non-injected, sclera clear OROPHARYNX:no exudate, no erythema and lips, buccal mucosa, and tongue normal  NECK: supple, thyroid normal size, non-tender, without nodularity LYMPH:  no palpable cervical, axillary, or supraclavicular lymphadenopathy  LUNGS: decreased breath sounds bilaterally, no wheezes; normal breathing effort HEART: regular rate & rhythm and no murmurs and no lower extremity edema ABDOMEN:abdomen soft, non-tender and normal bowel sounds Musculoskeletal:no cyanosis of digits and no clubbing  PSYCH: alert & oriented x 3 with fluent speech NEURO: no focal motor/sensory deficits.  Steady gait  BREASTS: breast inspection shows them to be symmetrical without nipple discharge or inversion. No palpable mass that I could appreciate in the right breast or axilla. Left breast with palpable 1x1 cm mobile mass to lower outer quadrant, s/p biopsy covered with steri strips. No palpable axillary mass that I could appreciate.    LABORATORY DATA:  I have reviewed the data as listed CBC Latest Ref Rng & Units 08/27/2017 05/31/2017 02/05/2017  WBC 4.0 - 10.5 K/uL 3.7(L) 3.5(L) 3.4(L)  Hemoglobin 12.0 - 15.0 g/dL  13.8 13.8 13.9  Hematocrit 36.0 - 46.0 % 41.5 41.2 41.6  Platelets 150 - 400 K/uL 236 241.0 260.0   CMP Latest Ref Rng & Units 08/27/2017 05/31/2017 04/02/2017  Glucose 65 - 99 mg/dL 102(H) 95 100(H)  BUN 6 - 20 mg/dL <5(L) 5(L) 8  Creatinine 0.44 - 1.00 mg/dL 0.67 0.68 0.80  Sodium 135 - 145 mmol/L 134(L) 138 138  Potassium 3.5 - 5.1 mmol/L 3.8 3.5 3.5  Chloride 101 - 111 mmol/L 95(L) 96 97  CO2 22 - 32 mmol/L 31 36(H) 34(H)  Calcium 8.9 - 10.3 mg/dL 9.5 9.7 9.8  Total Protein 6.0 - 8.3 g/dL - 7.4 -  Total Bilirubin 0.2 - 1.2 mg/dL - 1.2 -  Alkaline Phos 39 - 117 U/L - 63 -  AST 0 - 37 U/L - 23 -  ALT 0 - 35 U/L - 16 -   PATHOLOGY  Diagnosis 08/29/17 Breast, left, needle core biopsy, 4:00 o'clock mass - INVASIVE DUCTAL CARCINOMA WITH ASSOCIATED CALCIFICATIONS. - DUCTAL CARCINOMA IN SITU. - SEE COMMENT. Microscopic Comment The carcinoma appears grade I. A breast prognostic profile will be performed and the results reported separately. The results were called to [facility] on 08/30/17. (JBK:gt, 08/30/17) PROGNOSTIC INDICATORS Results: IMMUNOHISTOCHEMICAL AND MORPHOMETRIC ANALYSIS PERFORMED MANUALLY Estrogen Receptor: 100%, POSITIVE, STRONG STAINING INTENSITY Progesterone Receptor: 70%, POSITIVE, STRONG STAINING INTENSITY Proliferation Marker Ki67: 10% REFERENCE RANGE ESTROGEN RECEPTOR NEGATIVE 0% POSITIVE =>1% REFERENCE RANGE PROGESTERONE  RECEPTOR NEGATIVE 0% POSITIVE =>1% All controls stained appropriately Enid Cutter MD Pathologist, Electronic Signature ( Signed 09/03/2017) FLUORESCENCE IN-SITU HYBRIDIZATION Results: HER2 - NEGATIVE RATIO OF HER2/CEP17 SIGNALS 1.05 AVERAGE HER2 COPY NUMBER PER CELL 2.05   RADIOGRAPHIC STUDIES: I have personally reviewed the radiological images as listed and agreed with the findings in the report. US Breast Ltd Uni Left Inc Axilla  Result Date: 08/29/2017 CLINICAL DATA:  Palpable lump left breast EXAM: 2D DIGITAL DIAGNOSTIC LEFT MAMMOGRAM WITH CAD AND ADJUNCT TOMO ULTRASOUND LEFT BREAST COMPARISON:  Previous exam(s). ACR Breast Density Category d: The breast tissue is extremely dense, which lowers the sensitivity of mammography. FINDINGS: CC and MLO views of the left breast are submitted. In the place of previously noted linear branching calcifications of lateral left breast, there is now an associated mass measuring 1.1 cm. Mammographic images were processed with CAD. On physical exam, there is a palpable are 1.5 cm mass at the left breast 3 to 4 o'clock position. Targeted ultrasound is performed, showing irregular hypoechoic mass abutting the skin and the chest wall measuring 1.3 x 0.9 x 1.1 cm at the left breast 4 o'clock 3 cm from nipple. Ultrasound of the left axilla demonstrate several abnormal thickened cortex left axillary lymph nodes. IMPRESSION: Highly suspicious findings. RECOMMENDATION: Recommend ultrasound-guided core biopsy of left breast mass. The abnormal lymph nodes in the left axilla are not accessible via ultrasound-guided core biopsy because the patient is cachectic and it would not be feasible for both the ultrasound probe and the biopsy apparatus to be in the space simultaneously to perform the biopsy. I have discussed the findings and recommendations with the patient. Results were also provided in writing at the conclusion of the visit. If applicable, a reminder letter will  be sent to the patient regarding the next appointment. BI-RADS CATEGORY  5: Highly suggestive of malignancy. Electronically Signed   By: Abelardo Diesel M.D.   On: 08/29/2017 13:35   Mm Diag Breast Tomo Uni Left  Result Date: 08/29/2017 CLINICAL  DATA:  Palpable lump left breast EXAM: 2D DIGITAL DIAGNOSTIC LEFT MAMMOGRAM WITH CAD AND ADJUNCT TOMO ULTRASOUND LEFT BREAST COMPARISON:  Previous exam(s). ACR Breast Density Category d: The breast tissue is extremely dense, which lowers the sensitivity of mammography. FINDINGS: CC and MLO views of the left breast are submitted. In the place of previously noted linear branching calcifications of lateral left breast, there is now an associated mass measuring 1.1 cm. Mammographic images were processed with CAD. On physical exam, there is a palpable are 1.5 cm mass at the left breast 3 to 4 o'clock position. Targeted ultrasound is performed, showing irregular hypoechoic mass abutting the skin and the chest wall measuring 1.3 x 0.9 x 1.1 cm at the left breast 4 o'clock 3 cm from nipple. Ultrasound of the left axilla demonstrate several abnormal thickened cortex left axillary lymph nodes. IMPRESSION: Highly suspicious findings. RECOMMENDATION: Recommend ultrasound-guided core biopsy of left breast mass. The abnormal lymph nodes in the left axilla are not accessible via ultrasound-guided core biopsy because the patient is cachectic and it would not be feasible for both the ultrasound probe and the biopsy apparatus to be in the space simultaneously to perform the biopsy. I have discussed the findings and recommendations with the patient. Results were also provided in writing at the conclusion of the visit. If applicable, a reminder letter will be sent to the patient regarding the next appointment. BI-RADS CATEGORY  5: Highly suggestive of malignancy. Electronically Signed   By: Abelardo Diesel M.D.   On: 08/29/2017 13:35   Mm Clip Placement Left  Result Date:  08/29/2017 CLINICAL DATA:  Status post ultrasound-guided core biopsy of left breast mass EXAM: DIAGNOSTIC LEFT MAMMOGRAM POST ULTRASOUND BIOPSY COMPARISON:  Previous exam(s). FINDINGS: Mammographic images were obtained following left breast ultrasound guided biopsy of mass at 4 o'clock. Cc and lateral views of the left breast demonstrate ribbon biopsy clip 0.5 cm anterior and superior to the mass and calcifications of concern. IMPRESSION: Post biopsy mammogram as described. Final Assessment: Post Procedure Mammograms for Marker Placement Electronically Signed   By: Abelardo Diesel M.D.   On: 08/29/2017 14:43   [place] Lt Breast Bx W Loc Dev 1st Lesion Img Bx Spec US Guide  Addendum Date: 09/02/2017   ADDENDUM REPORT: 08/30/2017 13:31 ADDENDUM: Pathology revealed GRADE I INVASIVE DUCTAL CARCINOMA WITH ASSOCIATED CALCIFICATIONS, DUCTAL CARCINOMA IN SITU of the Left breast, 4:00 o'clock. This was found to be concordant by Dr. Abelardo Diesel. Pathology results were discussed with the patient by telephone. The patient reported doing well after the biopsy with tenderness at the site. Post biopsy instructions and care were reviewed and questions were answered. The patient was encouraged to call [facility] for any additional concerns. Surgical consultation has been arranged with Dr. Autumn Messing at [facility] on August 30, 2017. Pathology results reported by Terie Purser, RN on 08/30/2017. Electronically Signed   By: Abelardo Diesel M.D.   On: 08/30/2017 13:31   Result Date: 09/02/2017 CLINICAL DATA:  Suspicious mass in the left breast for biopsy. EXAM: ULTRASOUND GUIDED LEFT BREAST CORE NEEDLE BIOPSY COMPARISON:  Previous exam(s). FINDINGS: I met with the patient and we discussed the procedure of ultrasound-guided biopsy, including benefits and alternatives. We discussed the high likelihood of a successful procedure. We discussed the risks of the procedure, including infection, bleeding,  tissue injury, clip migration, and inadequate sampling. Informed written consent was given. The usual time-out protocol was performed immediately prior to the procedure.  Lesion quadrant: Lateral lower quadrant Using sterile technique and 1% Lidocaine as local anesthetic, under direct ultrasound visualization, a 14 gauge spring-loaded device was used to perform biopsy of spiculated mass at the left breast using a superior approach. At the conclusion of the procedure a ribbon tissue marker clip was deployed into the biopsy cavity. Follow up 2 view mammogram was performed and dictated separately. IMPRESSION: Ultrasound guided biopsy of left breast.  No apparent complications. Electronically Signed: By: Abelardo Diesel M.D. On: 08/29/2017 14:29    ASSESSMENT & PLAN: Kristin [name] is a 72 y.o. 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    1. 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 -She has seen Dr. Marlou Starks who is planning for mastectomy given her 1.3 cm breast cancer with suspicious posterior calcifications and small breast tissue. She has abnormal appearing left axillary lymph node on [place] but not palpable; she will get LN mapping during surgery. She is agreeable with that plan. Surgery scheduled for 09/16/17.  -We discussed Oncotype Dx test on the surgical sample to help stratify her recurrence risk and help make decision about adjuvant chemotherapy, however she is not a good candidate for systemic chemo due to her multiple comorbidities and poor nutritional status; we will not proceed with oncotype.  -Given the strong ER and PR expression in her  postmenopausal status, she is a candidate for adjuvant endocrine therapy with aromatase inhibitor for a total of 5-10 years to reduce the risk of cancer recurrence. Potential benefits and side effects were discussed with patient and she is interested. We will plan to start this mid-January at next follow up or after radiation therapy if she needs adjuvant rad onc.  -She will see radiation oncologist Dr. Sondra Come 10/02/17. If her surgical sentinel lymph nodes are negative, she would not need post mastectomy radiation.  -We also discussed the breast cancer surveillance after her surgery. She will continue annual unilateral screening mammogram, self exam, and a routine office visit with lab and exam with [place]. -I encouraged her to have healthy diet and exercise regularly; her activity is limited by COPD.  -return in 2 months to begin adjuvant endocrine therapy or after rad onc is complete; will follow her surgical pathology   2. Bone Health -Her 06/09/17 DEXA was normal  -we discussed AI may weaken her bones; will continue to monitor bone density   3. Smoking cessation -she is down to 1-2 cigarettes per day -CT in 04/05/17 shows advanced changes of bullous emphysema and a 6 mm nodule within the lingula -repeat CT scheduled 10/08/17 per Dr. Lamonte Sakai in pulmonology   4. COPD, motor-neuron disease, dysphagia -she will continue f/u with established providers for ongoing medical issues   PLAN: -proceed with surgery left breast lumpectomy and sentinel lymph node biopsy, per Dr. Marlou Starks 11/19 -Due to her chronic comorbidities and limited social support, she is not a candidate for adjuvant chemotherapy.  -f/u in 4 weeks to begin adjuvant AI or after radiation   All questions were answered. The patient knows to call the clinic with any problems, questions or concerns. I spent 40 minutes counseling the patient face to face. The total time spent in the appointment was 45 minutes more than 50% was  on counseling.    Cira Rue NP  I have seen the patient, examined her. I agree with the assessment and and plan and have edited the notes.   Kristin [name] was found to have a 1.3cm left breast IDC,  but multiple abnormal thickened cortex left axillary lymph nodes.  Lymph nodes was not able to be biopsied.  She is scheduled to have left breast mastectomy and sentinel lymph node mapping next week.  She seems to be a poor candidate for be, due to multiple comorbidities, especially COPD and ALS.  I do not plan to do Oncotype or mammaprint.  Recommend her to consider adjuvant aromatase inhibitor, which is interested.  I plan to see her back in 3 weeks after surgery, to finalize her adjuvant therapy.   Truitt Merle, MD 09/09/2017 7:34 PM

## 2017-09-09 NOTE — Telephone Encounter (Signed)
Pt c/o of being SOB. O2 92% on RA. PT declined O2 from this nurse. Lacie NP made aware.

## 2017-09-10 ENCOUNTER — Telehealth: Payer: Self-pay | Admitting: Family

## 2017-09-10 ENCOUNTER — Other Ambulatory Visit: Payer: Self-pay | Admitting: Family

## 2017-09-10 DIAGNOSIS — I10 Essential (primary) hypertension: Secondary | ICD-10-CM

## 2017-09-10 DIAGNOSIS — E782 Mixed hyperlipidemia: Secondary | ICD-10-CM

## 2017-09-10 NOTE — Pre-Procedure Instructions (Signed)
[facility]  09/10/2017      [facility] [place], [place] - [address] [place] [address] Phone: [phone] Fax: [phone]  [facility] - [place], [place] [address] [place] [address] Phone: [phone] Fax: [phone]    Your procedure is scheduled on Monday, September 16, 2017  Report to [facility] Admitting Entrance "A" at 9:00A.M.   Call this number if you have problems the morning of surgery:  [phone]   Remember:  Do not eat food or drink liquids after midnight.  Take these medicines the morning of surgery with A SIP OF WATER: SYNTHROID and Tiotropium Bromide-Olodaterol (STIOLTO RESPIMAT).  As of today, stop taking all Aspirins, Vitamins, Fish oils, and Herbal medications. Also stop all NSAIDS i.e. Advil, Ibuprofen, Motrin, Aleve, Anaprox, Naproxen, BC and Goody Powders.  Please complete your PRE-SURGERY ENSURE that was given to before you leave your house the morning of surgery.  Please, if able, drink it in one setting. DO NOT SIP.   Do not wear jewelry, make-up or nail polish.  Do not wear lotions, powders, perfumes, or deodorant.  Do not shave 48 hours prior to surgery.   Do not bring valuables to the hospital.  [facility] is not responsible for any belongings or valuables.  Contacts, dentures or bridgework may not be worn into surgery.  Leave your suitcase in the car.  After surgery it may be brought to your room.  For patients admitted to the hospital, discharge time will be determined by your treatment team.  Patients discharged the day of surgery will not be allowed to drive home.   Special instructions:   [place]- Preparing For Surgery  Before surgery, you can play an important role. Because skin is not sterile, your skin needs to be as free of germs as possible. You can reduce the number of germs on your skin by washing  with CHG (chlorahexidine gluconate) Soap before surgery.  CHG is an antiseptic cleaner which kills germs and bonds with the skin to continue killing germs even after washing.  Please do not use if you have an allergy to CHG or antibacterial soaps. If your skin becomes reddened/irritated stop using the CHG.  Do not shave (including legs and underarms) for at least 48 hours prior to first CHG shower. It is OK to shave your face.  Please follow these instructions carefully.   1. Shower the NIGHT BEFORE SURGERY and the MORNING OF SURGERY with CHG.   2. If you chose to wash your hair, wash your hair first as usual with your normal shampoo.  3. After you shampoo, rinse your hair and body thoroughly to remove the shampoo.  4. Use CHG as you would any other liquid soap. You can apply CHG directly to the skin and wash gently with a scrungie or a clean washcloth.   5. Apply the CHG Soap to your body ONLY FROM THE NECK DOWN.  Do not use on open wounds or open sores. Avoid contact with your eyes, ears, mouth and genitals (private parts). Wash Face and genitals (private parts)  with your normal soap.  6. Wash thoroughly, paying special attention to the area where your surgery will be performed.  7. Thoroughly rinse your body with warm water from the neck down.  8. DO NOT shower/wash with your normal soap after using and rinsing off the CHG Soap.  9. Pat yourself  dry with a CLEAN TOWEL.  10. Wear CLEAN PAJAMAS to bed the night before surgery, wear comfortable clothes the morning of surgery  11. Place CLEAN SHEETS on your bed the night of your first shower and DO NOT SLEEP WITH PETS.  Day of Surgery: Do not apply any deodorants/lotions. Please wear clean clothes to the hospital/surgery center.    Please read over the following fact sheets that you were given. Pain Booklet, Coughing and Deep Breathing and Surgical Site Infection Prevention

## 2017-09-10 NOTE — Telephone Encounter (Signed)
Pt called in and said that she is totally out of her htcz and her rosuvastatin (CRESTOR) 5 MG tablet [[id number]]  Explain to her that she will need to est with new provider.  She stated that she is completely out of both of these meds?  Can they be refilled?

## 2017-09-11 ENCOUNTER — Encounter (HOSPITAL_COMMUNITY): Payer: Self-pay

## 2017-09-11 ENCOUNTER — Other Ambulatory Visit: Payer: Self-pay

## 2017-09-11 ENCOUNTER — Encounter (HOSPITAL_COMMUNITY)
Admission: RE | Admit: 2017-09-11 | Discharge: 2017-09-11 | Disposition: A | Discharge status: Home / Self Care | Payer: Medicare HMO | Source: Ambulatory Visit | Attending: General Surgery | Admitting: General Surgery

## 2017-09-11 DIAGNOSIS — I10 Essential (primary) hypertension: Secondary | ICD-10-CM | POA: Diagnosis not present

## 2017-09-11 DIAGNOSIS — E610 Copper deficiency: Secondary | ICD-10-CM | POA: Diagnosis not present

## 2017-09-11 DIAGNOSIS — F17200 Nicotine dependence, unspecified, uncomplicated: Secondary | ICD-10-CM | POA: Diagnosis not present

## 2017-09-11 DIAGNOSIS — C50912 Malignant neoplasm of unspecified site of left female breast: Secondary | ICD-10-CM | POA: Insufficient documentation

## 2017-09-11 DIAGNOSIS — G1220 Motor neuron disease, unspecified: Secondary | ICD-10-CM | POA: Diagnosis not present

## 2017-09-11 DIAGNOSIS — E039 Hypothyroidism, unspecified: Secondary | ICD-10-CM | POA: Diagnosis not present

## 2017-09-11 DIAGNOSIS — E785 Hyperlipidemia, unspecified: Secondary | ICD-10-CM | POA: Insufficient documentation

## 2017-09-11 DIAGNOSIS — Z01818 Encounter for other preprocedural examination: Secondary | ICD-10-CM | POA: Diagnosis not present

## 2017-09-11 DIAGNOSIS — Z79899 Other long term (current) drug therapy: Secondary | ICD-10-CM | POA: Insufficient documentation

## 2017-09-11 DIAGNOSIS — R7303 Prediabetes: Secondary | ICD-10-CM | POA: Insufficient documentation

## 2017-09-11 DIAGNOSIS — J449 Chronic obstructive pulmonary disease, unspecified: Secondary | ICD-10-CM | POA: Diagnosis not present

## 2017-09-11 DIAGNOSIS — Z01812 Encounter for preprocedural laboratory examination: Secondary | ICD-10-CM | POA: Insufficient documentation

## 2017-09-11 DIAGNOSIS — E60 Dietary zinc deficiency: Secondary | ICD-10-CM | POA: Insufficient documentation

## 2017-09-11 HISTORY — DX: Dyspnea, unspecified: R06.00

## 2017-09-11 LAB — BASIC METABOLIC PANEL
ANION GAP: 8 (ref 5–15)
CALCIUM: 9.5 mg/dL (ref 8.9–10.3)
CO2: 30 mmol/L (ref 22–32)
Chloride: 95 mmol/L — ABNORMAL LOW (ref 101–111)
Creatinine, Ser: 0.62 mg/dL (ref 0.44–1.00)
GFR calc Af Amer: >60 mL/min (ref >60)
GLUCOSE: 93 mg/dL (ref 65–99)
Potassium: 3.7 mmol/L (ref 3.5–5.1)
Sodium: 133 mmol/L — ABNORMAL LOW (ref 135–145)

## 2017-09-11 LAB — CBC
HCT: 40.3 % (ref 36.0–46.0)
HEMOGLOBIN: 13.7 g/dL (ref 12.0–15.0)
MCH: 31.6 pg (ref 26.0–34.0)
MCHC: 34 g/dL (ref 30.0–36.0)
MCV: 92.9 fL (ref 78.0–100.0)
Platelets: 249 10*3/uL (ref 150–400)
RBC: 4.34 MIL/uL (ref 3.87–5.11)
RDW: 13.6 % (ref 11.5–15.5)
WBC: 3.8 10*3/uL — ABNORMAL LOW (ref 4.0–10.5)

## 2017-09-11 LAB — GLUCOSE, CAPILLARY: GLUCOSE-CAPILLARY: 88 mg/dL (ref 65–99)

## 2017-09-11 MED ORDER — HYDROCHLOROTHIAZIDE 25 MG PO TABS: ORAL_TABLET | ORAL | 0 refills | Status: DC

## 2017-09-11 MED ORDER — ROSUVASTATIN CALCIUM 5 MG PO TABS: ORAL_TABLET | ORAL | 0 refills | Status: DC

## 2017-09-11 NOTE — Telephone Encounter (Signed)
Called pt and advised message from the provider. Pt understood and verbalized understanding. Nothing further is needed.   Called [place] CT to cancel CT.

## 2017-09-11 NOTE — Telephone Encounter (Signed)
The CT scan was ordered to follow a pulmonary nodule.  I believe that we can cancel the CT scan and that the evaluation of this nodule can be incorporated into her overall evaluation for her breast cancer.

## 2017-09-11 NOTE — Progress Notes (Signed)
PCP: Dr. Toniann Ket  @  [facility] [address].  Denies cardiologist Pulmonary: Dr. Lamonte Sakai  States had a stress test  Yrs. Ago --normal doesn't remember when or where, possible [facility]?

## 2017-09-11 NOTE — Telephone Encounter (Signed)
Called pt no answer x's 10 rings. [facility] only [facility]  Must make appt w/new provider for future refills...Kristin [name]

## 2017-09-12 NOTE — Progress Notes (Signed)
Anesthesia Chart Review:  Pt is a 71 year old female scheduled for L mastectomy with sentinel lymph node biopsy with Jovita Kussmaul, MD  - PCP is Mauricio Po, NP - Pulmonologist is Baltazar Apo, MD - Neurologist is Narda Amber, MD who follows patient for copper deficiency myelopathy, motor neuron disease, zinc deficiency.   PMH includes:  Severe COPD, HTN, hyperlipidemia, pre-diabetes, hypothyroidism, motor neuron disease. Current smoker. BMI 15  Medications include: albuterol, hctz, losartan, rosuvastatin, synthorid, stiolto respimat  BP (!) 99/57   Pulse 85   Temp 36.8 C   Resp 18   Ht 5\' 6"  (1.676 m)   Wt 93 lb (42.2 kg)   SpO2 93%   BMI 15.01 kg/m    Preoperative labs reviewed.  Glucose 93. HbA1c was 6.2 on 08/27/17  CXR 03/11/17:  - Severe bullous emphysema. No acute pneumonia nor CHF. - Nodular density that projects most likely in the left lower lobe. Given the this radiographic finding and the patient's smoking history, chest CT scanning is recommended to exclude malignancy. - Thoracic aortic atherosclerosis.  CT chest 04/05/17:  1. Lingular nodule measures 6 mm. Non-contrast chest CT at 6-12 months is recommended. If the nodule is stable at time of repeat CT, then future CT at 18-24 months (from today's scan) is recommended for high-risk patients.  2. Aortic Atherosclerosis and Emphysema   EKG 08/27/17:  - NSR. Rightward axis. Nonspecific ST and T wave abnormality. Prolonged QT. noise artifact  Pt was originally scheduled for lumpectomy 08/30/17 but treatment plan was changed.  Prior to that originally scheduled procedure, given pt's severe COPD, I reached out to Dr. Lamonte Sakai for input regarding anesthesia for this pt.  He responded "patient is at increased risk for complications due to her severe COPD, but that there is nothing to preclude a procedure AS LONG AS She is not experiencing an active exacerbation (ie wheeze, increased SOB, increased sputum, etc)."  Pt  denied acute sx at pre-admission testing.    If no changes, I anticipate pt can proceed with surgery as scheduled.   Willeen Cass, FNP-BC [facility]/Anesthesiology Phone: [phone] 09/12/2017 5:24 PM

## 2017-09-14 ENCOUNTER — Telehealth: Payer: Self-pay | Admitting: Nurse Practitioner

## 2017-09-14 NOTE — Telephone Encounter (Signed)
Appt already scheduled per 11/13 sch message.

## 2017-09-16 ENCOUNTER — Telehealth: Payer: Self-pay | Admitting: Emergency Medicine

## 2017-09-16 ENCOUNTER — Inpatient Hospital Stay (HOSPITAL_COMMUNITY)
Admission: AD | Admit: 2017-09-16 | Discharge: 2017-09-24 | DRG: 579 | Disposition: A | Discharge status: Skilled Nursing Facility | Payer: Medicare HMO | Source: Ambulatory Visit | Attending: General Surgery | Admitting: General Surgery

## 2017-09-16 ENCOUNTER — Ambulatory Visit (HOSPITAL_COMMUNITY): Payer: Medicare HMO | Admitting: Emergency Medicine

## 2017-09-16 ENCOUNTER — Encounter (HOSPITAL_COMMUNITY): Payer: Medicare HMO

## 2017-09-16 ENCOUNTER — Encounter (HOSPITAL_COMMUNITY): Payer: Self-pay | Admitting: *Deleted

## 2017-09-16 ENCOUNTER — Ambulatory Visit (HOSPITAL_COMMUNITY)
Admission: RE | Admit: 2017-09-16 | Discharge: 2017-09-16 | Disposition: A | Discharge status: Home / Self Care | Payer: Medicare HMO | Source: Ambulatory Visit | Attending: General Surgery | Admitting: General Surgery

## 2017-09-16 ENCOUNTER — Encounter (HOSPITAL_COMMUNITY)
Admission: AD | Disposition: A | Discharge status: Skilled Nursing Facility | Payer: Self-pay | Source: Ambulatory Visit | Attending: General Surgery

## 2017-09-16 ENCOUNTER — Ambulatory Visit (HOSPITAL_COMMUNITY): Payer: Medicare HMO | Admitting: Anesthesiology

## 2017-09-16 DIAGNOSIS — J9601 Acute respiratory failure with hypoxia: Secondary | ICD-10-CM

## 2017-09-16 DIAGNOSIS — Z01818 Encounter for other preprocedural examination: Secondary | ICD-10-CM

## 2017-09-16 DIAGNOSIS — C50512 Malignant neoplasm of lower-outer quadrant of left female breast: Secondary | ICD-10-CM | POA: Diagnosis not present

## 2017-09-16 DIAGNOSIS — R0902 Hypoxemia: Secondary | ICD-10-CM | POA: Diagnosis not present

## 2017-09-16 DIAGNOSIS — K5900 Constipation, unspecified: Secondary | ICD-10-CM | POA: Diagnosis present

## 2017-09-16 DIAGNOSIS — Z170 Estrogen receptor positive status [ER+]: Secondary | ICD-10-CM

## 2017-09-16 DIAGNOSIS — G9341 Metabolic encephalopathy: Secondary | ICD-10-CM | POA: Diagnosis not present

## 2017-09-16 DIAGNOSIS — Z681 Body mass index (BMI) 19 or less, adult: Secondary | ICD-10-CM | POA: Diagnosis not present

## 2017-09-16 DIAGNOSIS — Z9012 Acquired absence of left breast and nipple: Secondary | ICD-10-CM

## 2017-09-16 DIAGNOSIS — Y92239 Unspecified place in hospital as the place of occurrence of the external cause: Secondary | ICD-10-CM | POA: Diagnosis not present

## 2017-09-16 DIAGNOSIS — Y838 Other surgical procedures as the cause of abnormal reaction of the patient, or of later complication, without mention of misadventure at the time of the procedure: Secondary | ICD-10-CM | POA: Diagnosis not present

## 2017-09-16 DIAGNOSIS — E7800 Pure hypercholesterolemia, unspecified: Secondary | ICD-10-CM | POA: Diagnosis not present

## 2017-09-16 DIAGNOSIS — E785 Hyperlipidemia, unspecified: Secondary | ICD-10-CM | POA: Diagnosis not present

## 2017-09-16 DIAGNOSIS — G709 Myoneural disorder, unspecified: Secondary | ICD-10-CM | POA: Diagnosis present

## 2017-09-16 DIAGNOSIS — C801 Malignant (primary) neoplasm, unspecified: Secondary | ICD-10-CM | POA: Diagnosis not present

## 2017-09-16 DIAGNOSIS — R488 Other symbolic dysfunctions: Secondary | ICD-10-CM | POA: Diagnosis not present

## 2017-09-16 DIAGNOSIS — M6281 Muscle weakness (generalized): Secondary | ICD-10-CM | POA: Diagnosis not present

## 2017-09-16 DIAGNOSIS — C773 Secondary and unspecified malignant neoplasm of axilla and upper limb lymph nodes: Secondary | ICD-10-CM | POA: Diagnosis not present

## 2017-09-16 DIAGNOSIS — J439 Emphysema, unspecified: Secondary | ICD-10-CM | POA: Diagnosis not present

## 2017-09-16 DIAGNOSIS — C50919 Malignant neoplasm of unspecified site of unspecified female breast: Secondary | ICD-10-CM | POA: Diagnosis present

## 2017-09-16 DIAGNOSIS — R197 Diarrhea, unspecified: Secondary | ICD-10-CM | POA: Diagnosis present

## 2017-09-16 DIAGNOSIS — I10 Essential (primary) hypertension: Secondary | ICD-10-CM | POA: Diagnosis not present

## 2017-09-16 DIAGNOSIS — E43 Unspecified severe protein-calorie malnutrition: Secondary | ICD-10-CM | POA: Diagnosis present

## 2017-09-16 DIAGNOSIS — E039 Hypothyroidism, unspecified: Secondary | ICD-10-CM | POA: Diagnosis present

## 2017-09-16 DIAGNOSIS — R0602 Shortness of breath: Secondary | ICD-10-CM | POA: Diagnosis not present

## 2017-09-16 DIAGNOSIS — E119 Type 2 diabetes mellitus without complications: Secondary | ICD-10-CM

## 2017-09-16 DIAGNOSIS — J441 Chronic obstructive pulmonary disease with (acute) exacerbation: Secondary | ICD-10-CM | POA: Diagnosis not present

## 2017-09-16 DIAGNOSIS — J95822 Acute and chronic postprocedural respiratory failure: Secondary | ICD-10-CM | POA: Diagnosis not present

## 2017-09-16 DIAGNOSIS — F17210 Nicotine dependence, cigarettes, uncomplicated: Secondary | ICD-10-CM | POA: Diagnosis not present

## 2017-09-16 DIAGNOSIS — R262 Difficulty in walking, not elsewhere classified: Secondary | ICD-10-CM | POA: Diagnosis not present

## 2017-09-16 DIAGNOSIS — Z4682 Encounter for fitting and adjustment of non-vascular catheter: Secondary | ICD-10-CM | POA: Diagnosis not present

## 2017-09-16 DIAGNOSIS — J449 Chronic obstructive pulmonary disease, unspecified: Secondary | ICD-10-CM

## 2017-09-16 DIAGNOSIS — Z0189 Encounter for other specified special examinations: Secondary | ICD-10-CM

## 2017-09-16 DIAGNOSIS — Z720 Tobacco use: Secondary | ICD-10-CM | POA: Diagnosis not present

## 2017-09-16 DIAGNOSIS — Z483 Aftercare following surgery for neoplasm: Secondary | ICD-10-CM | POA: Diagnosis not present

## 2017-09-16 DIAGNOSIS — C50912 Malignant neoplasm of unspecified site of left female breast: Secondary | ICD-10-CM | POA: Diagnosis not present

## 2017-09-16 DIAGNOSIS — Z9911 Dependence on respirator [ventilator] status: Secondary | ICD-10-CM

## 2017-09-16 DIAGNOSIS — R0789 Other chest pain: Secondary | ICD-10-CM | POA: Diagnosis not present

## 2017-09-16 DIAGNOSIS — J9622 Acute and chronic respiratory failure with hypercapnia: Secondary | ICD-10-CM | POA: Diagnosis not present

## 2017-09-16 HISTORY — PX: MASTECTOMY W/ SENTINEL NODE BIOPSY: SHX2001

## 2017-09-16 LAB — BLOOD GAS, ARTERIAL
ACID-BASE EXCESS: 3.5 mmol/L — AB (ref 0.0–2.0)
Acid-Base Excess: 4.2 mmol/L — ABNORMAL HIGH (ref 0.0–2.0)
BICARBONATE: 30.3 mmol/L — AB (ref 20.0–28.0)
BICARBONATE: 30.6 mmol/L — AB (ref 20.0–28.0)
DELIVERY SYSTEMS: POSITIVE
Delivery systems: POSITIVE
Drawn by: 519031
EXPIRATORY PAP: 6
FIO2: 45
FIO2: 30
INSPIRATORY PAP: 18
Inspiratory PAP: 14
LHR: 18 {breaths}/min
Mode: POSITIVE
Mode: POSITIVE
O2 SAT: 98 %
O2 Saturation: 96.2 %
PATIENT TEMPERATURE: 98.6
PCO2 ART: 72.5 mmHg — AB (ref 32.0–48.0)
PCO2 ART: 68.1 mmHg — AB (ref 32.0–48.0)
PH ART: 7.275 — AB (ref 7.350–7.450)
PO2 ART: 154 mmHg — AB (ref 83.0–108.0)
PO2 ART: 105 mmHg (ref 83.0–108.0)
Patient temperature: 98.6
RATE: 12 resp/min
pH, Arterial: 7.244 — ABNORMAL LOW (ref 7.350–7.450)

## 2017-09-16 LAB — MRSA PCR SCREENING: MRSA by PCR: NEGATIVE

## 2017-09-16 LAB — GLUCOSE, CAPILLARY: Glucose-Capillary: 122 mg/dL — ABNORMAL HIGH (ref 65–99)

## 2017-09-16 SURGERY — MASTECTOMY WITH SENTINEL LYMPH NODE BIOPSY
Anesthesia: General | Site: Breast | Laterality: Left

## 2017-09-16 MED ORDER — MIDAZOLAM HCL 2 MG/2ML IJ SOLN
INTRAMUSCULAR | Status: AC
  Filled 2017-09-16: qty 2

## 2017-09-16 MED ORDER — HEPARIN SODIUM (PORCINE) 5000 UNIT/ML IJ SOLN
5000.0000 [IU] | Freq: Three times a day (TID) | INTRAMUSCULAR | Status: DC
  Administered 2017-09-17 – 2017-09-24 (×22): 5000 [IU] via SUBCUTANEOUS
  Filled 2017-09-16 (×24): qty 1

## 2017-09-16 MED ORDER — ALBUTEROL SULFATE (2.5 MG/3ML) 0.083% IN NEBU
INHALATION_SOLUTION | RESPIRATORY_TRACT | Status: AC
  Filled 2017-09-16: qty 3

## 2017-09-16 MED ORDER — ALBUTEROL SULFATE (2.5 MG/3ML) 0.083% IN NEBU: 2.5000 mg | INHALATION_SOLUTION | Freq: Once | RESPIRATORY_TRACT | Status: DC

## 2017-09-16 MED ORDER — HYDROMORPHONE HCL 1 MG/ML IJ SOLN: 0.2500 mg | INTRAMUSCULAR | Status: DC | PRN

## 2017-09-16 MED ORDER — ALBUTEROL SULFATE (2.5 MG/3ML) 0.083% IN NEBU: 2.5000 mg | INHALATION_SOLUTION | RESPIRATORY_TRACT | Status: DC | PRN

## 2017-09-16 MED ORDER — ACETAMINOPHEN 10 MG/ML IV SOLN
INTRAVENOUS | Status: DC | PRN
  Administered 2017-09-16: 1000 mg via INTRAVENOUS

## 2017-09-16 MED ORDER — FENTANYL CITRATE (PF) 100 MCG/2ML IJ SOLN
INTRAMUSCULAR | Status: AC
  Filled 2017-09-16: qty 2

## 2017-09-16 MED ORDER — FENTANYL CITRATE (PF) 250 MCG/5ML IJ SOLN
INTRAMUSCULAR | Status: AC
  Filled 2017-09-16: qty 5

## 2017-09-16 MED ORDER — LEVOTHYROXINE SODIUM 88 MCG PO TABS
88.0000 ug | ORAL_TABLET | Freq: Every day | ORAL | Status: DC
  Administered 2017-09-17: 88 ug via ORAL
  Filled 2017-09-16: qty 1

## 2017-09-16 MED ORDER — 0.9 % SODIUM CHLORIDE (POUR BTL) OPTIME
TOPICAL | Status: DC | PRN
  Administered 2017-09-16: 1000 mL

## 2017-09-16 MED ORDER — ALBUMIN HUMAN 5 % IV SOLN
INTRAVENOUS | Status: DC | PRN
  Administered 2017-09-16: 12:00:00 via INTRAVENOUS

## 2017-09-16 MED ORDER — LIDOCAINE HCL (CARDIAC) 20 MG/ML IV SOLN
INTRAVENOUS | Status: DC | PRN
  Administered 2017-09-16: 70 mg via INTRAVENOUS

## 2017-09-16 MED ORDER — FENTANYL CITRATE (PF) 100 MCG/2ML IJ SOLN
INTRAMUSCULAR | Status: DC | PRN
  Administered 2017-09-16: 50 ug via INTRAVENOUS

## 2017-09-16 MED ORDER — NALOXONE HCL 0.4 MG/ML IJ SOLN
INTRAMUSCULAR | Status: DC | PRN
  Administered 2017-09-16 (×3): 40 ug via INTRAVENOUS

## 2017-09-16 MED ORDER — TIOTROPIUM BROMIDE-OLODATEROL 2.5-2.5 MCG/ACT IN AERS: 2.0000 | INHALATION_SPRAY | Freq: Every day | RESPIRATORY_TRACT | Status: DC

## 2017-09-16 MED ORDER — CELECOXIB 200 MG PO CAPS
200.0000 mg | ORAL_CAPSULE | ORAL | Status: AC
  Administered 2017-09-16: 200 mg via ORAL
  Filled 2017-09-16: qty 1

## 2017-09-16 MED ORDER — ONDANSETRON HCL 4 MG/2ML IJ SOLN
4.0000 mg | Freq: Four times a day (QID) | INTRAMUSCULAR | Status: DC | PRN
  Administered 2017-09-16 – 2017-09-17 (×3): 4 mg via INTRAVENOUS
  Filled 2017-09-16 (×3): qty 2

## 2017-09-16 MED ORDER — PROPOFOL 10 MG/ML IV BOLUS
INTRAVENOUS | Status: DC | PRN
  Administered 2017-09-16: 100 mg via INTRAVENOUS

## 2017-09-16 MED ORDER — PROPOFOL 10 MG/ML IV BOLUS
INTRAVENOUS | Status: AC
  Filled 2017-09-16: qty 20

## 2017-09-16 MED ORDER — LOSARTAN POTASSIUM 50 MG PO TABS: 50.0000 mg | ORAL_TABLET | Freq: Every day | ORAL | Status: DC

## 2017-09-16 MED ORDER — ONDANSETRON HCL 4 MG/2ML IJ SOLN
INTRAMUSCULAR | Status: DC | PRN
  Administered 2017-09-16: 4 mg via INTRAVENOUS

## 2017-09-16 MED ORDER — CHLORHEXIDINE GLUCONATE CLOTH 2 % EX PADS: 6.0000 | MEDICATED_PAD | Freq: Once | CUTANEOUS | Status: DC

## 2017-09-16 MED ORDER — PHENYLEPHRINE HCL 10 MG/ML IJ SOLN
INTRAVENOUS | Status: DC | PRN
  Administered 2017-09-16: 75 ug/min via INTRAVENOUS

## 2017-09-16 MED ORDER — BUPIVACAINE-EPINEPHRINE (PF) 0.25% -1:200000 IJ SOLN
INTRAMUSCULAR | Status: AC
  Filled 2017-09-16: qty 30

## 2017-09-16 MED ORDER — LIDOCAINE 2% (20 MG/ML) 5 ML SYRINGE
INTRAMUSCULAR | Status: AC
  Filled 2017-09-16: qty 5

## 2017-09-16 MED ORDER — LACTATED RINGERS IV SOLN
INTRAVENOUS | Status: DC
  Administered 2017-09-16 (×2): via INTRAVENOUS

## 2017-09-16 MED ORDER — TECHNETIUM TC 99M SULFUR COLLOID FILTERED
1.0000 | Freq: Once | INTRAVENOUS | Status: AC | PRN
  Administered 2017-09-16: 1 via INTRADERMAL

## 2017-09-16 MED ORDER — MORPHINE SULFATE (PF) 2 MG/ML IV SOLN
1.0000 mg | INTRAVENOUS | Status: DC | PRN
  Administered 2017-09-16 – 2017-09-17 (×4): 1 mg via INTRAVENOUS
  Administered 2017-09-17: 2 mg via INTRAVENOUS
  Filled 2017-09-16 (×6): qty 1

## 2017-09-16 MED ORDER — DEXAMETHASONE SODIUM PHOSPHATE 10 MG/ML IJ SOLN
INTRAMUSCULAR | Status: DC | PRN
  Administered 2017-09-16: 10 mg via INTRAVENOUS

## 2017-09-16 MED ORDER — CEFAZOLIN SODIUM-DEXTROSE 2-4 GM/100ML-% IV SOLN
2.0000 g | INTRAVENOUS | Status: AC
  Administered 2017-09-16: 2 g via INTRAVENOUS
  Filled 2017-09-16: qty 100

## 2017-09-16 MED ORDER — ACETAMINOPHEN 500 MG PO TABS
1000.0000 mg | ORAL_TABLET | ORAL | Status: AC
  Administered 2017-09-16: 1000 mg via ORAL
  Filled 2017-09-16: qty 2

## 2017-09-16 MED ORDER — HYDROCHLOROTHIAZIDE 25 MG PO TABS: 25.0000 mg | ORAL_TABLET | Freq: Every day | ORAL | Status: DC

## 2017-09-16 MED ORDER — BUPIVACAINE-EPINEPHRINE 0.25% -1:200000 IJ SOLN
INTRAMUSCULAR | Status: DC | PRN
  Administered 2017-09-16: 10 mL

## 2017-09-16 MED ORDER — GABAPENTIN 300 MG PO CAPS
300.0000 mg | ORAL_CAPSULE | ORAL | Status: AC
  Administered 2017-09-16: 300 mg via ORAL
  Filled 2017-09-16: qty 1

## 2017-09-16 MED ORDER — FENTANYL CITRATE (PF) 100 MCG/2ML IJ SOLN
25.0000 ug | Freq: Once | INTRAMUSCULAR | Status: AC
  Administered 2017-09-16: 25 ug via INTRAVENOUS

## 2017-09-16 MED ORDER — PANTOPRAZOLE SODIUM 40 MG IV SOLR
40.0000 mg | INTRAVENOUS | Status: DC
  Administered 2017-09-16: 40 mg via INTRAVENOUS
  Filled 2017-09-16: qty 40

## 2017-09-16 MED ORDER — ONDANSETRON 4 MG PO TBDP
4.0000 mg | ORAL_TABLET | Freq: Four times a day (QID) | ORAL | Status: DC | PRN
Start: 2017-09-16 — End: 2017-09-24
  Filled 2017-09-16: qty 1

## 2017-09-16 MED ORDER — ONDANSETRON HCL 4 MG/2ML IJ SOLN
INTRAMUSCULAR | Status: AC
  Filled 2017-09-16: qty 2

## 2017-09-16 MED ORDER — SODIUM CHLORIDE 0.9 % IJ SOLN
INTRAMUSCULAR | Status: AC
  Filled 2017-09-16: qty 10

## 2017-09-16 MED ORDER — KCL IN DEXTROSE-NACL 20-5-0.9 MEQ/L-%-% IV SOLN
INTRAVENOUS | Status: DC
  Administered 2017-09-16 – 2017-09-18 (×4): via INTRAVENOUS
  Filled 2017-09-16 (×8): qty 1000

## 2017-09-16 MED ORDER — METHYLENE BLUE 0.5 % INJ SOLN
INTRAVENOUS | Status: AC
  Filled 2017-09-16: qty 10

## 2017-09-16 MED ORDER — IPRATROPIUM-ALBUTEROL 0.5-2.5 (3) MG/3ML IN SOLN
3.0000 mL | Freq: Four times a day (QID) | RESPIRATORY_TRACT | Status: DC
  Administered 2017-09-16 – 2017-09-17 (×3): 3 mL via RESPIRATORY_TRACT
  Filled 2017-09-16 (×3): qty 3

## 2017-09-16 SURGICAL SUPPLY — 50 items
ADH SKN CLS APL DERMABOND .7 (GAUZE/BANDAGES/DRESSINGS) ×1
APPLIER CLIP 9.375 MED OPEN (MISCELLANEOUS) ×6
APR CLP MED 9.3 20 MLT OPN (MISCELLANEOUS) ×2
BINDER BREAST LRG (GAUZE/BANDAGES/DRESSINGS) IMPLANT
BINDER BREAST MEDIUM (GAUZE/BANDAGES/DRESSINGS) ×3 IMPLANT
BINDER BREAST XLRG (GAUZE/BANDAGES/DRESSINGS) IMPLANT
BIOPATCH RED 1 DISK 7.0 (GAUZE/BANDAGES/DRESSINGS) ×4 IMPLANT
BIOPATCH RED 1IN DISK 7.0MM (GAUZE/BANDAGES/DRESSINGS) ×2
CANISTER SUCT 3000ML PPV (MISCELLANEOUS) ×3 IMPLANT
CHLORAPREP W/TINT 26ML (MISCELLANEOUS) ×3 IMPLANT
CLIP APPLIE 9.375 MED OPEN (MISCELLANEOUS) ×2 IMPLANT
CONT SPEC 4OZ CLIKSEAL STRL BL (MISCELLANEOUS) ×3 IMPLANT
COVER PROBE W GEL 5X96 (DRAPES) ×3 IMPLANT
COVER SURGICAL LIGHT HANDLE (MISCELLANEOUS) ×3 IMPLANT
DERMABOND ADVANCED (GAUZE/BANDAGES/DRESSINGS) ×2
DERMABOND ADVANCED .7 DNX12 (GAUZE/BANDAGES/DRESSINGS) ×1 IMPLANT
DEVICE DISSECT PLASMABLAD 3.0S (MISCELLANEOUS) ×1 IMPLANT
DRAIN CHANNEL 19F RND (DRAIN) ×6 IMPLANT
DRAPE CHEST BREAST 15X10 FENES (DRAPES) ×3 IMPLANT
DRSG PAD ABDOMINAL 8X10 ST (GAUZE/BANDAGES/DRESSINGS) ×3 IMPLANT
DRSG TEGADERM 4X4.75 (GAUZE/BANDAGES/DRESSINGS) ×6 IMPLANT
ELECT CAUTERY BLADE 6.4 (BLADE) ×3 IMPLANT
ELECT REM PT RETURN 9FT ADLT (ELECTROSURGICAL) ×3
ELECTRODE REM PT RTRN 9FT ADLT (ELECTROSURGICAL) ×1 IMPLANT
EVACUATOR SILICONE 100CC (DRAIN) ×6 IMPLANT
GLOVE BIO SURGEON STRL SZ7.5 (GLOVE) ×3 IMPLANT
GOWN STRL REUS W/ TWL LRG LVL3 (GOWN DISPOSABLE) ×2 IMPLANT
GOWN STRL REUS W/TWL LRG LVL3 (GOWN DISPOSABLE) ×6
KIT BASIN OR (CUSTOM PROCEDURE TRAY) ×3 IMPLANT
KIT ROOM TURNOVER OR (KITS) ×3 IMPLANT
LIGHT WAVEGUIDE WIDE FLAT (MISCELLANEOUS) ×3 IMPLANT
NEEDLE 18GX1X1/2 (RX/OR ONLY) (NEEDLE) IMPLANT
NEEDLE FILTER BLUNT 18X 1/2SAF (NEEDLE)
NEEDLE FILTER BLUNT 18X1 1/2 (NEEDLE) IMPLANT
NEEDLE HYPO 25GX1X1/2 BEV (NEEDLE) ×3 IMPLANT
NS IRRIG 1000ML POUR BTL (IV SOLUTION) ×3 IMPLANT
PACK GENERAL/GYN (CUSTOM PROCEDURE TRAY) ×3 IMPLANT
PAD ARMBOARD 7.5X6 YLW CONV (MISCELLANEOUS) ×3 IMPLANT
PLASMABLADE 3.0S (MISCELLANEOUS) ×3
SPECIMEN JAR X LARGE (MISCELLANEOUS) ×3 IMPLANT
SUT ETHILON 3 0 FSL (SUTURE) ×6 IMPLANT
SUT MNCRL AB 4-0 PS2 18 (SUTURE) ×6 IMPLANT
SUT VIC AB 3-0 54X BRD REEL (SUTURE) IMPLANT
SUT VIC AB 3-0 BRD 54 (SUTURE)
SUT VIC AB 3-0 SH 18 (SUTURE) ×6 IMPLANT
SYR CONTROL 10ML LL (SYRINGE) ×3 IMPLANT
TOWEL OR 17X24 6PK STRL BLUE (TOWEL DISPOSABLE) ×3 IMPLANT
TOWEL OR 17X26 10 PK STRL BLUE (TOWEL DISPOSABLE) ×3 IMPLANT
TUBE CONNECTING 12'X1/4 (SUCTIONS) ×1
TUBE CONNECTING 12X1/4 (SUCTIONS) ×2 IMPLANT

## 2017-09-16 NOTE — Progress Notes (Signed)
Breathing treatment given as ordered.

## 2017-09-16 NOTE — Anesthesia Preprocedure Evaluation (Addendum)
Anesthesia Evaluation  Patient identified by MRN, date of birth, ID band Patient awake    Reviewed: Allergy & Precautions, NPO status   Airway Mallampati: II  TM Distance: >3 FB     Dental   Pulmonary shortness of breath, pneumonia, COPD, Current Smoker,    breath sounds clear to auscultation       Cardiovascular hypertension,  Rhythm:Regular Rate:Normal     Neuro/Psych  Headaches, History noted CG  Neuromuscular disease    GI/Hepatic GERD  ,  Endo/Other  diabetesHypothyroidism   Renal/GU      Musculoskeletal  (+) Arthritis ,   Abdominal   Peds  Hematology   Anesthesia Other Findings   Reproductive/Obstetrics                             Anesthesia Physical Anesthesia Plan  ASA: III  Anesthesia Plan: General   Post-op Pain Management:    Induction: Intravenous  PONV Risk Score and Plan: 2 and Treatment may vary due to age or medical condition  Airway Management Planned: LMA  Additional Equipment:   Intra-op Plan:   Post-operative Plan: Possible Post-op intubation/ventilation and Extubation in OR  Informed Consent: I have reviewed the patients History and Physical, chart, labs and discussed the procedure including the risks, benefits and alternatives for the proposed anesthesia with the patient or authorized representative who has indicated his/her understanding and acceptance.   Dental advisory given  Plan Discussed with: Anesthesiologist, CRNA and Surgeon  Anesthesia Plan Comments:         Anesthesia Quick Evaluation

## 2017-09-16 NOTE — Transfer of Care (Signed)
Immediate Anesthesia Transfer of Care Note  Patient: Kristin [name]  Procedure(s) Performed: LEFT MASTECTOMY WITH SENTINEL LYMPH NODE BIOPSY (Left Breast)  Patient Location: PACU  Anesthesia Type:General  Level of Consciousness: sedated, patient cooperative and responds to stimulation  Airway & Oxygen Therapy: Patient Spontanous Breathing and Patient connected to nasal cannula oxygen  Post-op Assessment: Report given to RN, Post -op Vital signs reviewed and stable and Patient moving all extremities  Post vital signs: Reviewed and stable  Last Vitals:  Vitals:   09/16/17 1145 09/16/17 1420  BP: (!) 122/56 131/65  Pulse: 99 85  Resp:  (!) 25  Temp:  (!) 36.4 C  SpO2: 97% 99%    Last Pain:  Vitals:   09/16/17 1420  TempSrc:   PainSc: 0-No pain      Patients Stated Pain Goal: 3 (55/73/22 0254)  Complications: No apparent anesthesia complications

## 2017-09-16 NOTE — Telephone Encounter (Signed)
lmomtcb x 1 for the pt 

## 2017-09-16 NOTE — Interval H&P Note (Signed)
History and Physical Interval Note:  09/16/2017 11:20 AM  Kristin [name]  has presented today for surgery, with the diagnosis of LEFT BREAST CANCER  The various methods of treatment have been discussed with the patient and family. After consideration of risks, benefits and other options for treatment, the patient has consented to  Procedure(s): LEFT MASTECTOMY WITH SENTINEL LYMPH NODE BIOPSY (Left) as a surgical intervention .  The patient's history has been reviewed, patient examined, no change in status, stable for surgery.  I have reviewed the patient's chart and labs.  Questions were answered to the patient's satisfaction.     TOTH III,PAUL S

## 2017-09-16 NOTE — Op Note (Signed)
09/16/2017  1:31 PM  PATIENT:  Kristin [name]  71 y.o. female  PRE-OPERATIVE DIAGNOSIS:  LEFT BREAST CANCER  POST-OPERATIVE DIAGNOSIS:  LEFT BREAST CANCER  PROCEDURE:  Procedure(s): LEFT MASTECTOMY WITH DEEP LEFT AXILLARY SENTINEL LYMPH NODE BIOPSY AND LEFT AXILLARY LYMPH NODE DISSECTION  SURGEON:  Surgeon(s) and Role:    * Jovita Kussmaul, MD - Primary  PHYSICIAN ASSISTANT:   ASSISTANTS: Sharyn Dross, RNFA   ANESTHESIA:   local and general  EBL:  minimal   BLOOD ADMINISTERED:none  DRAINS: (2) Jackson-Pratt drain(s) with closed bulb suction in the axilla and prepectoral space   LOCAL MEDICATIONS USED:  MARCAINE     SPECIMEN:  Source of Specimen:  left mastectomy and axillary contents  DISPOSITION OF SPECIMEN:  PATHOLOGY  COUNTS:  YES  TOURNIQUET:  * No tourniquets in log *  DICTATION: .Dragon Dictation   After informed consent was obtained the patient was brought to the operating room and placed in the supine position on the operating table.  After adequate induction of general anesthesia the patient's left chest, breast, and axillary area were prepped with ChloraPrep, allowed to dry, and draped in usual sterile manner.  An appropriate timeout was performed.  Earlier in the day the patient underwent injection of 1 mCi of technetium sulfur colloid in the subareolar position on the left.  The neoprobe was sent to technetium in an area of radioactivity was identified high in the left axilla.  She also had palpable firm axillary lymph nodes at this location.  First an elliptical incision was made around the nipple and areole complex in order to minimize the excess skin.  The incision was carried through the skin and subcutaneous tissue sharply with the plasma blade.  The skin hooks were used to elevate the skin flaps anteriorly towards the saline.  Then skin flaps were then created circumferentially by dissection between the breast tissue in the subcutaneous fat.  This  dissection was carried all the way to the chest wall.  The patient was very cachectic and the skin flaps were extremely thin.  You could palpate the tumor at the inferior lateral edge of the skin flap but at this point there was no more tissue that could be taken from this area without taking the skin.  The breast was then removed from the pectoralis muscle with the pectoralis fascia.  Once the breast was removed it was oriented with a stitch on the lateral skin and sent to pathology for further evaluation.  Dissection was then carried into the deep left axillary space sharply with the plasma blade under the direction of the neoprobe until we could identify the hot lymph nodes.  These lymph nodes were extremely high up at the level of the axillary vein.  There were several other palpable firm lymph nodes that did not take up the radioactive tracer.  Because of the location and nature of these nodes I decided to complete the axillary lymph node dissection.  The serratus muscle medially, latissimus muscle laterally, and axillary vein superiorly were identified in the lymphatic contents within the boundaries of this axilla were dissected out by combination of blunt right angle dissection and some sharp dissection with the plasma blade.  Several small vessels were controlled with clips.  The thoracodorsal and long thoracic nerves were identified and spared.  The left axillary contents were then sent en bloc to pathology.  Hemostasis was achieved using the plasma blade.  The wound was irrigated with saline.  The area  of the pectoralis nerves were blocked with quarter percent Marcaine.  2 small stab incisions were made near the anterior axillary line inferior to the operative bed with a 15 blade knife.  A tonsil clamp was placed through each of these incisions and used to bring a 19 [place] round Blake drain into the operative bed.  The medial drain was called along the chest wall and the lateral drain was placed in the  axilla.  The drains were anchored to the skin with 3-0 nylon stitches.  The superior and inferior skin flaps were then grossly reapproximated with interrupted 3-0 Vicryl stitches.  The skin was closed with a running 4-0 Monocryl subcuticular stitch.  Dermabond dressings were applied.  The patient tolerated the procedure well.  At the end of the case all needle sponge and instrument counts were correct.  The patient will try to be extubated but she was very debilitated at the start of the case and may require intubation and mechanical ventilation for several days.  We will monitor her in the recovery area and plan to admit her to the ICU.  PLAN OF CARE: Admit to inpatient   PATIENT DISPOSITION:  PACU - hemodynamically stable.   Delay start of Pharmacological VTE agent (>24hrs) due to surgical blood loss or risk of bleeding: no

## 2017-09-16 NOTE — Anesthesia Procedure Notes (Signed)
Procedure Name: LMA Insertion Date/Time: 09/16/2017 12:04 PM Performed by: Kerby Less, CRNA Pre-anesthesia Checklist: Patient identified, Emergency Drugs available, Suction available and Patient being monitored Patient Re-evaluated:Patient Re-evaluated prior to induction Oxygen Delivery Method: Circle System Utilized Preoxygenation: Pre-oxygenation with 100% oxygen Induction Type: IV induction Ventilation: Mask ventilation without difficulty LMA: LMA inserted LMA Size: 4.0 Number of attempts: 1 Placement Confirmation: positive ETCO2 Tube secured with: Tape Dental Injury: Teeth and Oropharynx as per pre-operative assessment

## 2017-09-16 NOTE — Anesthesia Postprocedure Evaluation (Signed)
Anesthesia Post Note  Patient: Management consultant  Procedure(s) Performed: LEFT MASTECTOMY WITH SENTINEL LYMPH NODE BIOPSY (Left Breast)     Patient location during evaluation: PACU Anesthesia Type: General Level of consciousness: awake Pain management: pain level controlled Vital Signs Assessment: post-procedure vital signs reviewed and stable Respiratory status: spontaneous breathing Cardiovascular status: stable    Last Vitals:  Vitals:   09/16/17 1605 09/16/17 1613  BP: 114/72 107/68  Pulse: 87 83  Resp: 18 (!) 24  Temp:  36.5 C  SpO2: 99% 99%    Last Pain:  Vitals:   09/16/17 1420  TempSrc:   PainSc: 0-No pain                 Passion Dewel Lotter

## 2017-09-16 NOTE — Consult Note (Signed)
PULMONARY / CRITICAL CARE MEDICINE   Name: Kristin [name] MRN: [id number] DOB: 03/25/1946    ADMISSION DATE:  09/16/2017 CONSULTATION DATE:  09/16/17  REFERRING MD:  Dr. Marlou Starks / [facility]   CHIEF COMPLAINT:  Acute Respiratory Failure   HISTORY OF PRESENT ILLNESS:   71 y/o F, smoker, chronic cough, with emphysema on CT, severe obstruction by PFT (FEV1 0.84/40%), 83mm pulmonary nodule who was admitted on 11/19 for planned mastectomy.    She has a known history of breast calcification diagnosed approximately 15 years ago which was watched with screening mammograms.  In early November 2018, she had a diagnostic mammogram which showed Irregular hypoechoic mass abutting the skin and the chest wall measuring 1.3 x 0.9 x 1.1 cm at the left breast 4 o'clock 3 cm from nipple.  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   In addition, she has had weight loss (previously 120lbs, now 93lbs).  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  She has mild left sided weakness (prescribed zinc & copper but did not tolerate).    On 11/19, she underwent a left mastectomy with deep left axillary sentinel lymph node biopsy & dissection.  Post-operatively, she was extubated but had respiratory distress and was placed on BiPAP.  ABG on BiPAP (7.244 / 72 / 154 / 30) was consistent with hypercarbic respiratory failure.    PCCM consulted for evaluation.   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  PAST SURGICAL HISTORY: She  has a past surgical history that includes Abdominal hysterectomy w/ partial vaginactomy; Thyroidectomy, partial; Tonsillectomy; Colonoscopy (05/19/2012); Esophagogastroduodenoscopy (01/03/2011); Breast biopsy (Right); Breast excisional biopsy (Right, 1998); and Multiple tooth extractions.  Allergies  Allergen Reactions  . Symbicort [Budesonide-Formoterol Fumarate] Anaphylaxis and Other (See Comments)    Throat swelling    No current facility-administered medications on file prior to encounter.    Current Outpatient Medications on File Prior to Encounter  Medication Sig  . albuterol (PROVENTIL) (2.5 MG/3ML) 0.083% nebulizer solution USE ONE VIAL IN NEBULIZER EVERY 4 HOURS AS NEEDED FOR  WHEEZING  OR  SHORTNESS  OF  BREATH  . losartan (COZAAR) 50 MG tablet TAKE 1 TABLET BY MOUTH ONCE DAILY (Patient taking differently: TAKE 50 MG BY MOUTH ONCE DAILY)  . SYNTHROID 88 MCG tablet TAKE 1 TABLET BY MOUTH ONCE DAILY BEFORE BREAKFAST (Patient taking differently: TAKE 88 MCG BY MOUTH ONCE DAILY BEFORE BREAKFAST)  . Tiotropium Bromide-Olodaterol (STIOLTO RESPIMAT) 2.5-2.5 MCG/ACT AERS Inhale 2 puffs into the lungs daily.    FAMILY HISTORY:  Her indicated that the status of her mother is unknown. She indicated that the status of her maternal grandmother is unknown. She indicated that the status of her maternal grandfather is unknown. She indicated that the status of her paternal grandmother is unknown. She indicated that the status of her paternal grandfather  is unknown. She indicated that the status of her cousin is unknown. She indicated that the status of her  neg hx is unknown.   SOCIAL HISTORY: She  reports that she has been smoking cigarettes.  She has a 13.25 pack-year smoking history. she has never used smokeless tobacco. She reports that she does not drink alcohol or use drugs.  REVIEW OF SYSTEMS:  Unable to complete as patient is on BiPAP and drowsy  SUBJECTIVE:    VITAL SIGNS: BP 107/60 (BP Location: Right Arm)   Pulse 86   Temp (!) 97.5 F (36.4 C)   Resp 17   Ht 5\' 6"  (1.676 m)   Wt 93 lb (42.2 kg)   SpO2 100%   BMI 15.01 kg/m   HEMODYNAMICS:    VENTILATOR SETTINGS:    INTAKE / OUTPUT: No intake/output data recorded.  PHYSICAL EXAMINATION: General:  Elderly black female in NAD on BiPAP. Neuro:  Drowsy but awakens to verbal stimuli. Answers questions appropriately.  HEENT:  [place]/AT, No JVD noted, PERRL. Cardiovascular:  RRR, no MRG. Lungs:  Difficult to assess with post op binder in place. Seems like poor air movement.  Abdomen:  Soft, non-distended, non-tender. Musculoskeletal:  No acute deformity or ROM limitation. Skin:  Intact, MMM.   LABS:  BMET Recent Labs  Lab 09/11/17 1010  NA 133*  K 3.7  CL 95*  CO2 30  BUN <5*  CREATININE 0.62  GLUCOSE 93    Electrolytes Recent Labs  Lab 09/11/17 1010  CALCIUM 9.5    CBC Recent Labs  Lab 09/11/17 1010  WBC 3.8*  HGB 13.7  HCT 40.3  PLT 249    Coag's No results for input(s): APTT, INR in the last 168 hours.  Sepsis Markers No results for input(s): LATICACIDVEN, PROCALCITON, O2SATVEN in the last 168 hours.  ABG Recent Labs  Lab 09/16/17 1445  PHART 7.244*  PCO2ART 72.5*  PO2ART 154*    Liver Enzymes No results for input(s): AST, ALT, ALKPHOS, BILITOT, ALBUMIN in the last 168 hours.  Cardiac Enzymes No results for input(s): TROPONINI, PROBNP in the last 168 hours.  Glucose Recent Labs  Lab 09/11/17 0853  GLUCAP 88    Imaging Nm Sentinel Node Inj-no Rpt (breast)  Result Date: 09/16/2017 Sulfur colloid was injected by  the nuclear medicine technologist for melanoma sentinel node.    STUDIES:    CULTURES:   ANTIBIOTICS: Peri-op ancef  SIGNIFICANT EVENTS: 11/19 admit for masectomy  LINES/TUBES: JP drain L breast 11/19 >  DISCUSSION: 71 year old female with end stage COPD and recent diagnosis breast Ca admitted 11/19 for L mastectomy. Post op she was somewhat drowsy with resp acidosis. She was started on BiPAP and admitted to ICU by gen surgery.  ASSESSMENT / PLAN:  Acute on chronic hypercarbic respiratory failure likely r/t post op hypoventilation in end stage COPD. COPD GOLD D without acute exacerbation. -Supplemental O2 to keep SpO2 90-95% -PRN BIPAP, continue for now. ABG improving.  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 Follow.  - If becomes more somnolent, needs ABG  Neuromuscular disease unspecified - some question of ALS - monitor   FAMILY  - Updates: patient updated bedside  in PACU  - Inter-disciplinary family meet or Palliative Care meeting due by:  11/26  Georgann Housekeeper, AGACNP-BC [facility] Pager [phone] or [phone]  09/16/2017 4:45 PM

## 2017-09-16 NOTE — H&P (Signed)
Kristin [name]  Location: [facility] Patient #: [id number] DOB: [birth year] Divorced / Language: English / Race: Black or African American Female   History of Present Illness The patient is a 71 year old female who presents for a follow-up for Breast cancer. The patient is a 71 year old black female who we haven't seen for some abnormal calcifications in the lower outer left breast. Since we saw her last she also developed a new mass in the lower outer left breast. This was scheduled for excisional biopsy today but the radiologist felt that they could finally biopsy the breast so she underwent percutaneous biopsy instead. 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 The tumor markers are pending. The mass appears to be in the same quadrant but anterior to the calcifications that we saw previously.   Problem List/Past Medical  BREAST CALCIFICATION, LEFT (R92.1)   Past Surgical History  Breast Biopsy  Right. Hysterectomy (not due to cancer) - Partial  Thyroid Surgery   Diagnostic Studies History  Colonoscopy  >10 years ago Mammogram  within last year  Allergies No Known Allergies  Allergies Reconciled   Medication History Stiolto Respimat (2.5-2.5MCG/ACT Aerosol Soln, Inhalation) Active. Albuterol Sulfate ((2.5 MG/3ML)0.083% Nebulized Soln, Inhalation) Active. Nizatidine (15MG /ML Solution, Oral) Active. Combivent Respimat (20-100MCG/ACT Aerosol Soln, Inhalation) Active. Synthroid (88MCG Tablet, Oral) Active. HydroCHLOROthiazide (25MG  Tablet, Oral) Active. Losartan Potassium (50MG  Tablet, Oral) Active. Rosuvastatin Calcium (5MG  Tablet, Oral) Active. duke mouthwash Active. Medications Reconciled  Social History  Caffeine use  Carbonated beverages, Tea. No alcohol use  No drug use  Tobacco use  Current some day smoker.  Family History  Alcohol Abuse  Father. Diabetes Mellitus  Mother. Heart Disease   Mother. Heart disease in female family member before age 74  Hypertension  Mother. Ischemic Bowel Disease  Mother.  Pregnancy / Birth History  Age at menarche  83 years. Contraceptive History  Oral contraceptives. Gravida  3 Maternal age  95-20 Para  3  Other Problems  Chronic Obstructive Lung Disease  Emphysema Of Lung  Gastric Ulcer  Gastroesophageal Reflux Disease  General anesthesia - complications  High blood pressure  Hypercholesterolemia  Lump In Breast  Thyroid Disease     Review of Systems General Present- Fatigue and Weight Loss. Not Present- Appetite Loss, Chills, Fever, Night Sweats and Weight Gain. Skin Present- Dryness. Not Present- Change in Wart/Mole, Hives, Jaundice, New Lesions, Non-Healing Wounds, Rash and Ulcer. HEENT Present- Seasonal Allergies. Not Present- Earache, Hearing Loss, Hoarseness, Nose Bleed, Oral Ulcers, Ringing in the Ears, Sinus Pain, Sore Throat, Visual Disturbances, Wears glasses/contact lenses and Yellow Eyes. Respiratory Present- Difficulty Breathing. Not Present- Bloody sputum, Chronic Cough, Snoring and Wheezing. Cardiovascular Present- Difficulty Breathing Lying Down, Palpitations, Shortness of Breath and Swelling of Extremities. Not Present- Chest Pain, Leg Cramps and Rapid Heart Rate. Gastrointestinal Present- Abdominal Pain, Bloating, Difficulty Swallowing and Gets full quickly at meals. Not Present- Bloody Stool, Change in Bowel Habits, Chronic diarrhea, Constipation, Excessive gas, Hemorrhoids, Indigestion, Nausea, Rectal Pain and Vomiting. Female Genitourinary Not Present- Frequency, Nocturia, Painful Urination, Pelvic Pain and Urgency. Musculoskeletal Present- Joint Stiffness. Not Present- Back Pain, Joint Pain, Muscle Pain, Muscle Weakness and Swelling of Extremities. Neurological Not Present- Decreased Memory, Fainting, Headaches, Numbness, Seizures, Tingling, Tremor, Trouble walking and Weakness. Psychiatric  Not Present- Anxiety, Bipolar, Change in Sleep Pattern, Depression, Fearful and Frequent crying. Endocrine Present- Cold Intolerance. Not Present- Excessive Hunger, Hair Changes, Heat Intolerance, Hot flashes and New Diabetes.  Vitals Weight:  96 lb Height: 66.25in Body Surface Area: 1.47 m Body Mass Index: 15.38 kg/m  Pulse: 72 (Regular)  BP: 102/64 (Sitting, Left Arm, Standard)       Physical Exam  General Mental Status-Alert. General Appearance-Consistent with stated age. Hydration-Well hydrated. Voice-Normal. Note: She appears malnourished   Head and Neck Head-normocephalic, atraumatic with no lesions or palpable masses. Trachea-midline. Thyroid Gland Characteristics - normal size and consistency.  Eye Eyeball - Bilateral-Extraocular movements intact. Sclera/Conjunctiva - Bilateral-No scleral icterus.  Chest and Lung Exam Chest and lung exam reveals -quiet, even and easy respiratory effort with no use of accessory muscles and on auscultation, normal breath sounds, no adventitious sounds and normal vocal resonance. Inspection Chest Wall - Normal. Back - normal.  Breast Note: There is a palpable round mobile mass measuring about 1 cm in the lower outer left breast. There is no palpable mass in the right breast. There is a small mobile palpable lymph node that measures about 5 mm in the left axilla. There is no other palpable lymphadenopathy in the axilla, supraclavicular, or cervical area   Cardiovascular Cardiovascular examination reveals -normal heart sounds, regular rate and rhythm with no murmurs and normal pedal pulses bilaterally.  Abdomen Inspection Inspection of the abdomen reveals - No Hernias. Skin - Scar - no surgical scars. Palpation/Percussion Palpation and Percussion of the abdomen reveal - Soft, Non Tender, No Rebound tenderness, No Rigidity (guarding) and No hepatosplenomegaly. Auscultation Auscultation of the  abdomen reveals - Bowel sounds normal.  Neurologic Neurologic evaluation reveals -alert and oriented x 3 with no impairment of recent or remote memory. Mental Status-Normal.  Musculoskeletal Normal Exam - Left-Upper Extremity Strength Normal and Lower Extremity Strength Normal. Normal Exam - Right-Upper Extremity Strength Normal and Lower Extremity Strength Normal.  Lymphatic Head & Neck  General Head & Neck Lymphatics: Bilateral - Description - Normal. Axillary  General Axillary Region: Bilateral - Description - Normal. Tenderness - Non Tender. Femoral & Inguinal  Generalized Femoral & Inguinal Lymphatics: Bilateral - Description - Normal. Tenderness - Non Tender.     MALIGNANT NEOPLASM OF LOWER-OUTER QUADRANT OF LEFT FEMALE BREAST, UNSPECIFIED ESTROGEN RECEPTOR STATUS (C50.512) Impression: The patient appears to have a 1.3 cm cancer in the lower outer left breast with some suspicious calcifications posterior to this. Given these findings and her small breast size we have talked about the different options for treatment and at this point we both favor mastectomy. Although her lymph nodes looked abnormal by ultrasound they are difficult to palpate and I think mapping her lymph nodes would be a reasonable thing to do. I have discussed with her in detail the risks and benefits of the operation as well as some of the technical aspects and she understands and wishes to proceed Current Plans Referred to Oncology, for evaluation and follow up (Oncology). Routine. Pt Education - Breast cancer: discussed with patient and provided information.

## 2017-09-16 NOTE — Telephone Encounter (Signed)
Margarito Courser is returning call for the patient.  She states patient is in recovery at the hospital.  CB [phone].

## 2017-09-16 NOTE — Progress Notes (Signed)
States she is breathing better at this time o2 sat after treatment is 100%

## 2017-09-16 NOTE — Progress Notes (Addendum)
o2 sat 94 % on ra applied 1 liter of o2 per Dr. Nyoka Cowden sat 98%

## 2017-09-16 NOTE — Telephone Encounter (Signed)
Spoke with Hassan Rowan and advised her we did not have any samples of Stiolto. She wil have pt call back with pharmacy so we can send it in. Nothing further is needed.

## 2017-09-17 ENCOUNTER — Encounter (HOSPITAL_COMMUNITY): Payer: Self-pay | Admitting: General Surgery

## 2017-09-17 DIAGNOSIS — J9622 Acute and chronic respiratory failure with hypercapnia: Secondary | ICD-10-CM

## 2017-09-17 LAB — TSH: TSH: 1.227 u[IU]/mL (ref 0.350–4.500)

## 2017-09-17 MED ORDER — BOOST / RESOURCE BREEZE PO LIQD: 1.0000 | Freq: Two times a day (BID) | ORAL | Status: DC

## 2017-09-17 MED ORDER — PANTOPRAZOLE SODIUM 40 MG PO TBEC: 40.0000 mg | DELAYED_RELEASE_TABLET | Freq: Every day | ORAL | Status: DC

## 2017-09-17 MED ORDER — MORPHINE SULFATE (PF) 4 MG/ML IV SOLN
1.0000 mg | INTRAVENOUS | Status: DC | PRN
  Administered 2017-09-17 (×2): 2 mg via INTRAVENOUS
  Filled 2017-09-17 (×2): qty 1

## 2017-09-17 MED ORDER — ALBUTEROL SULFATE (2.5 MG/3ML) 0.083% IN NEBU
2.5000 mg | INHALATION_SOLUTION | Freq: Four times a day (QID) | RESPIRATORY_TRACT | Status: DC | PRN
  Administered 2017-09-17 – 2017-09-24 (×10): 2.5 mg via RESPIRATORY_TRACT
  Filled 2017-09-17 (×11): qty 3

## 2017-09-17 MED ORDER — NICOTINE 7 MG/24HR TD PT24
7.0000 mg | MEDICATED_PATCH | Freq: Every day | TRANSDERMAL | Status: DC
  Administered 2017-09-17 – 2017-09-24 (×8): 7 mg via TRANSDERMAL
  Filled 2017-09-17 (×8): qty 1

## 2017-09-17 MED ORDER — PANTOPRAZOLE SODIUM 40 MG IV SOLR
40.0000 mg | Freq: Every day | INTRAVENOUS | Status: DC
  Administered 2017-09-17: 40 mg via INTRAVENOUS
  Filled 2017-09-17: qty 40

## 2017-09-17 NOTE — Progress Notes (Signed)
Patient arrived to room 30M 03, NAD noted, VSS. Denies needs at this time.

## 2017-09-17 NOTE — Plan of Care (Signed)
  Progressing Activity: Ability to maintain or regain function will improve 09/17/2017 0054 - Progressing by Ernestene Kiel, RN Education: Knowledge of the prescribed therapeutic regimen will improve 09/17/2017 0054 - Progressing by Ernestene Kiel, RN Physical Regulation: Postoperative complications will be avoided or minimized 09/17/2017 0054 - Progressing by Ernestene Kiel, RN Pain Management: Pain level will decrease 09/17/2017 0054 - Progressing by Ernestene Kiel, RN Skin Integrity: Demonstration of wound healing without infection will improve 09/17/2017 0054 - Progressing by Ernestene Kiel, RN

## 2017-09-17 NOTE — Progress Notes (Signed)
1 Day Post-Op   Subjective/Chief Complaint: No complaints. Breathing seems much less labored today   Objective: Vital signs in last 24 hours: Temp:  [97.3 F (36.3 C)-98.3 F (36.8 C)] 98.3 F (36.8 C) (11/20 0820) Pulse Rate:  [75-107] 97 (11/20 0800) Resp:  [13-35] 20 (11/20 0800) BP: (79-157)/(48-77) 91/55 (11/20 0800) SpO2:  [90 %-100 %] 95 % (11/20 0800) FiO2 (%):  [28 %-30 %] 28 % (11/19 1921) Weight:  [42.2 kg (93 lb)-42.5 kg (93 lb 11.1 oz)] 42.5 kg (93 lb 11.1 oz) (11/20 0530) Last BM Date: 09/13/17  Intake/Output from previous day: 11/19 0701 - 11/20 0700 In: 2389.2 [I.V.:2064.2; IV Piggyback:250] Out: 514 [Urine:350; Drains:114; Blood:50] Intake/Output this shift: Total I/O In: 100 [I.V.:100] Out: -   General appearance: alert and cooperative Resp: clear to auscultation bilaterally and less labored Chest wall: skin flaps look good Cardio: regular rate and rhythm GI: soft, non-tender; bowel sounds normal; no masses,  no organomegaly  Lab Results:  No results for input(s): WBC, HGB, HCT, PLT in the last 72 hours. BMET No results for input(s): NA, K, CL, CO2, GLUCOSE, BUN, CREATININE, CALCIUM in the last 72 hours. PT/INR No results for input(s): LABPROT, INR in the last 72 hours. 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    Anti-infectives: Anti-infectives (From admission, onward)   Start     Dose/Rate Route Frequency Ordered Stop   09/16/17 0907  ceFAZolin (ANCEF) IVPB 2g/100 mL premix     2 g 200 mL/hr over 30 Minutes Intravenous On call to O.R. 09/16/17 0907 09/16/17 1220      Assessment/Plan: s/p Procedure(s): LEFT MASTECTOMY WITH SENTINEL LYMPH NODE BIOPSY (Left) Advance diet. Start fulls today Appreciate CCM  assistance Transfer to stepdown PT consult  LOS: 1 day    TOTH III,PAUL S 09/17/2017

## 2017-09-17 NOTE — Progress Notes (Signed)
PULMONARY / CRITICAL CARE MEDICINE   Name: Kristin [name] MRN: [id number] DOB: 1946-02-21    ADMISSION DATE:  09/16/2017 CONSULTATION DATE:  09/16/17  REFERRING MD:  Dr. Marlou Starks / [facility]   CHIEF COMPLAINT:  Acute Respiratory Failure   HISTORY OF PRESENT ILLNESS:   71 y/o F, smoker, chronic cough, with emphysema on CT, severe obstruction by PFT (FEV1 0.84/40%), 46mm pulmonary nodule who was admitted on 11/19 for planned mastectomy.    She has a known history of breast calcification diagnosed approximately 15 years ago which was watched with screening mammograms.  In early November 2018, she had a diagnostic mammogram which showed Irregular hypoechoic mass abutting the skin and the chest wall measuring 1.3 x 0.9 x 1.1 cm at the left breast 4 o'clock 3 cm from nipple.  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   In addition, she has had weight loss (previously 120lbs, now 93lbs).  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  She has mild left sided weakness (prescribed zinc & copper but did not tolerate).    On 11/19, she underwent a left mastectomy with deep left axillary sentinel lymph node biopsy & dissection.  Post-operatively, she was extubated but had respiratory distress and was placed on BiPAP.  ABG on BiPAP (7.244 / 72 / 154 / 30) was consistent with hypercarbic respiratory failure.    PCCM consulted for evaluation.     SUBJECTIVE:  No acute distress at rest.   wears BiPAP as needed does not like it or want.  VITAL SIGNS: BP (!) 92/48   Pulse (!) 108   Temp 98.3 F (36.8 C) (Oral)   Resp 18   Ht 5\' 6"  (1.676 m)   Wt 93 lb 11.1 oz (42.5 kg)   SpO2 90%   BMI 15.12 kg/m   HEMODYNAMICS:    VENTILATOR SETTINGS: FiO2 (%):  [28 %-30 %] 28 %  INTAKE / OUTPUT: I/O last 3 completed shifts: In: 2389.2  [I.V.:2064.2; Other:75; IV Piggyback:250] Out: 453 [Urine:350; Drains:114; Blood:50]  PHYSICAL EXAMINATION: General: Well-nourished well-developed and angry female. HEENT: MM pink/moist no JVD appreciated PSY: Angry  Neuro: Intact CV: Heart sounds are regular regular rate and rhythm PULM: Decreased variation throughout. MI:WOEH, non-tender, bsx4 active  Extremities: warm/dry, negative edema  Skin: Left vasectomy dressing dry and intact    LABS:  BMET Recent Labs  Lab 09/11/17 1010  NA 133*  K 3.7  CL 95*  CO2 30  BUN <5*  CREATININE 0.62  GLUCOSE 93    Electrolytes Recent Labs  Lab 09/11/17 1010  CALCIUM 9.5    CBC Recent Labs  Lab 09/11/17 1010  WBC 3.8*  HGB 13.7  HCT 40.3  PLT 249    Coag's No results for input(s): APTT, INR in the last 168 hours.  Sepsis Markers No results for input(s): LATICACIDVEN, PROCALCITON, O2SATVEN in the last 168 hours.  ABG Recent Labs  Lab 09/16/17 1445 09/16/17 1550  PHART 7.244* 7.275*  PCO2ART 72.5* 68.1*  PO2ART 154* 105    Liver Enzymes No results for input(s): AST, ALT, ALKPHOS, BILITOT, ALBUMIN in the last 168 hours.  Cardiac Enzymes No results for input(s): TROPONINI, PROBNP in the last 168 hours.  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    STUDIES:    CULTURES:   ANTIBIOTICS: Peri-op ancef  SIGNIFICANT EVENTS: 11/19 admit for masectomy  LINES/TUBES: JP drain L breast 11/19 >  DISCUSSION: 71 year old female with end stage COPD and recent diagnosis breast Ca admitted 11/19 for L mastectomy. Post op she was somewhat drowsy with resp acidosis. She was started on BiPAP and admitted to ICU by gen surgery.  Awake alert 09/17/2017.  Angry because she was confronted smoking in the bathroom by  the nurses.  ASSESSMENT / PLAN:  Acute on chronic hypercarbic respiratory failure likely r/t post op hypoventilation in end stage COPD.  Not suicidal with a cigarette in the ICU bathroom 09/17/2017. COPD GOLD D without acute exacerbation. -Supplemental O2 to keep SpO2 90-95% -PRN BIPAP Okay to move to stepdown unit Careful with narcotic. Pulmonary will bill available as needed.  HTN -Telemetry monitoring -Holding home HCTZ, losartan, crestor while NPO -PRN hydralazine  Invasive ductal carcinoma S/p mastectomy 11/19 - per surgery  Pre-DM Monitor glucose on chemistry  Acute metabolic encephalopathy r/t hypercarbia Resolved 09/17/2017 PRN BiPAP - Correct acidosis with BiPAP, sedating meds wearing off. Follow.    Neuromuscular disease unspecified - some question of ALS - monitor   FAMILY  - Updates: patient updated. 11/20  - Inter-disciplinary family meet or Palliative Care meeting due by:  11/26  Richardson Landry Minor ACNP Maryanna Shape PCCM Pager [phone] till 1 pm If no answer page [phone] 09/17/2017, 10:27 AM

## 2017-09-17 NOTE — Progress Notes (Addendum)
Initial Nutrition Assessment  DOCUMENTATION CODES:   Severe malnutrition in context of chronic illness, Underweight  INTERVENTION:    Try Boost Breeze po BID, each supplement provides 250 kcal and 9 grams of protein  Suggest advance diet to regular so patient can choose foods she can tolerate. Full liquids are mostly milk-containing products.  NUTRITION DIAGNOSIS:   Severe Malnutrition related to chronic illness(COPD, dysphagia) as evidenced by severe muscle depletion, severe fat depletion, percent weight loss(22.5% weight loss within 1 year).  GOAL:   Patient will meet greater than or equal to 90% of their needs  MONITOR:   Diet advancement, PO intake, Supplement acceptance  REASON FOR ASSESSMENT:   Consult(Verbal consult from RN ) Assessment of nutrition requirement/status  ASSESSMENT:   71 yo female with PMH of vitamin D deficiency, GERD, COPD, chronic obstructive bronchitis, hypothyroidism, HTN, HLD, diverticulosis, IBS, prediabetes, motor neuron DZ, breast cancer who was admitted on 11/19 S/P mastectomy for invasive ductal carcinoma of the breast.   Patient reports ongoing poor intake for > 1 year. She weighed ~120 lbs 1 year ago. 22.5% weight loss within the past year is significant for the time frame. She has some trouble with dysphagia. S/P SLP swallow evaluation this morning; SLP recommends regular diet with thin liquids. Patient has tried Ensure supplements and has not tolerated them in the past; they make her feel light-headed/dizzy and her feet swell. She is lactose intolerant, but eats ice cream because she knows she needs to eat more. She has diarrhea and/or bloating after eating/drinking ice cream, pudding, or milk. She agreed to try Boost Breeze supplement when she is not as nauseated as right now.   Labs and medications reviewed.  NUTRITION - FOCUSED PHYSICAL EXAM:    Most Recent Value  Orbital Region  No depletion  Upper Arm Region  Severe depletion   Thoracic and Lumbar Region  Severe depletion  Buccal Region  Mild depletion  Temple Region  Severe depletion  Clavicle Bone Region  Severe depletion  Clavicle and Acromion Bone Region  Severe depletion  Scapular Bone Region  Severe depletion  Dorsal Hand  No depletion  Patellar Region  Severe depletion  Anterior Thigh Region  Severe depletion  Posterior Calf Region  Severe depletion  Edema (RD Assessment)  None  Hair  Reviewed  Eyes  Reviewed  Mouth  Reviewed  Skin  Reviewed  Nails  Reviewed       Diet Order:  Diet full liquid Room service appropriate? Yes; Fluid consistency: Thin  EDUCATION NEEDS:   No education needs have been identified at this time  Skin:  Skin Assessment: Skin Integrity Issues: Skin Integrity Issues:: Incisions Incisions: left breast  Last BM:  11/16  Height:   Ht Readings from Last 1 Encounters:  09/16/17 5\' 6"  (1.676 m)    Weight:   Wt Readings from Last 1 Encounters:  09/17/17 93 lb 11.1 oz (42.5 kg)    Ideal Body Weight:  59.1 kg  BMI:  Body mass index is 15.12 kg/m.  Estimated Nutritional Needs:   Kcal:  1500-1700  Protein:  70-85 gm  Fluid:  1.5-1.7 L   Molli Barrows, RD, LDN, [place] Pager [phone] After Hours Pager [phone]

## 2017-09-17 NOTE — Progress Notes (Signed)
Pt's O2 sats dropped in 80's, nurse checked on pt,  pt smoking a cigarette in room with 2L O2NC. Pt informed of her safety and other pt's/staff members. Cigarettes and lighter removed from pt's room. Pt very apologetic.

## 2017-09-17 NOTE — Evaluation (Signed)
Clinical/Bedside Swallow Evaluation Patient Details  Name: Kristin [name] MRN: [id number] Date of Birth: 1946-07-09  Today's Date: 09/17/2017 Time: SLP Start Time (ACUTE ONLY): 0903 SLP Stop Time (ACUTE ONLY): 0915 SLP Time Calculation (min) (ACUTE ONLY): 12 min  Past Medical History:  Past Medical History:  Diagnosis Date  . Acute sinusitis, unspecified   . Breast calcifications   . Breast cancer ([place])   . Cervicalgia   . Chest pain, unspecified   . Complication of anesthesia    hard time waking up - cant remember how long ago  . COPD (chronic obstructive pulmonary disease) ([place])   . Degeneration of cervical intervertebral disc   . Diverticulosis of colon (without mention of hemorrhage)   . Dyspnea   . Esophageal reflux   . Hyperlipidemia   . IBS (irritable bowel syndrome)   . 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 Neuromuscular disorder ([place])    motor neuron disease  . Obstructive chronic bronchitis with exacerbation ([place])   . Personal history of peptic ulcer disease   . Pneumonia    hx  . Pre-diabetes    per patient  . Unspecified essential hypertension   . Unspecified hypothyroidism   . Unspecified vitamin D deficiency   . Vitiligo    Past Surgical History:  Past Surgical History:  Procedure Laterality Date  . ABDOMINAL HYSTERECTOMY W/ PARTIAL VAGINACTOMY    . BREAST BIOPSY Right   . BREAST EXCISIONAL BIOPSY Right 1998  . COLONOSCOPY  05/19/2012  . ESOPHAGOGASTRODUODENOSCOPY  01/03/2011   normal   . LEFT MASTECTOMY WITH SENTINEL LYMPH NODE BIOPSY Left 09/16/2017   Performed by Jovita Kussmaul, MD at [place]  . MULTIPLE TOOTH EXTRACTIONS    . THYROIDECTOMY, PARTIAL    . TONSILLECTOMY     HPI:  Pt is a 71 year old female with end stage COPD and recent diagnosis breast Ca admitted 11/19 for L mastectomy. Post-procedure she was extubated but had respiratory distress and was placed on BiPAP. PMH also includes pre-diabetes, PNA, motor neuron disease, esophageal reflux,  dyspnea, and dysphagia over the last 10 years per pt report. MBS completed in May 2015 showed a functional oropharyngeal swallow, although testing was limited as pt would not consume straw sips, larger boluses of purees, or any more solid textures. Pt also says that she had EGD and gastric emptying study that were also [facility].   Assessment / Plan / Recommendation Clinical Impression  Pt consumed pureed foods and thin liquids from her breakfast tray with functional appearing swallow and no overt signs of aspiration. Her reports of dysphagia appear to be more chronic, and she had an MBS in the past that was [facility]. She is very selective of what types of foods she will eat at home - therefore recommend advancing her to regular textures as medically appropriate post-operatively, so that she can continue to make these selections for herself. Given her decreased respiratory status which can increase the risk for aspiration, brief SLP f/u is warranted to assess for tolerance.  SLP Visit Diagnosis: Dysphagia, unspecified (R13.10)    Aspiration Risk  Mild aspiration risk    Diet Recommendation Regular;Thin liquid;Other (Comment)(when medically appropriate)   Liquid Administration via: Cup;Straw Medication Administration: Whole meds with liquid Supervision: Patient able to self feed;Intermittent supervision to cue for compensatory strategies Compensations: Slow rate;Small sips/bites;Follow solids with liquid Postural Changes: Seated upright at 90 degrees;Remain upright for at least 30 minutes after po intake    Other  Recommendations Oral Care  Recommendations: Oral care BID   Follow up Recommendations None      Frequency and Duration min 2x/week  1 week       Prognosis Prognosis for Safe Diet Advancement: Good      Swallow Study   General HPI: Pt is a 71 year old female with end stage COPD and recent diagnosis breast Ca admitted 11/19 for L mastectomy. Post-procedure she was extubated but had  respiratory distress and was placed on BiPAP. PMH also includes pre-diabetes, PNA, motor neuron disease, esophageal reflux, dyspnea, and dysphagia over the last 10 years per pt report. MBS completed in May 2015 showed a functional oropharyngeal swallow, although testing was limited as pt would not consume straw sips, larger boluses of purees, or any more solid textures. Pt also says that she had EGD and gastric emptying study that were also [facility]. Type of Study: Bedside Swallow Evaluation Previous Swallow Assessment: see HPI Diet Prior to this Study: Thin liquids;Other (Comment)(full liquids) Temperature Spikes Noted: No Respiratory Status: Nasal cannula History of Recent Intubation: Yes Length of Intubations (days): (for procedure) Date extubated: 09/16/17 Behavior/Cognition: Alert;Cooperative;Pleasant mood Oral Care Completed by SLP: No Vision: Functional for self-feeding Self-Feeding Abilities: Able to feed self Patient Positioning: Upright in bed Baseline Vocal Quality: Normal    Oral/Motor/Sensory Function     Ice Chips Ice chips: Not tested   Thin Liquid Thin Liquid: Within functional limits Presentation: Self Fed;Straw    Nectar Thick Nectar Thick Liquid: Not tested   Honey Thick Honey Thick Liquid: Not tested   Puree Puree: Within functional limits Presentation: Self Fed;Spoon   Solid   GO   Solid: Not tested        Germain Osgood 09/17/2017,9:32 AM  Germain Osgood, M.A. CCC-SLP [phone]

## 2017-09-17 NOTE — Progress Notes (Signed)
Pt is resting comfortably on 2L [place]. BiPAP is not indicated at this time and is on standby in pt's room.

## 2017-09-18 ENCOUNTER — Inpatient Hospital Stay (HOSPITAL_COMMUNITY): Payer: Medicare HMO

## 2017-09-18 LAB — BASIC METABOLIC PANEL
Anion gap: 5 (ref 5–15)
Anion gap: 6 (ref 5–15)
Anion gap: 9 (ref 5–15)
BUN: 6 mg/dL (ref 6–20)
BUN: 6 mg/dL (ref 6–20)
BUN: 7 mg/dL (ref 6–20)
CHLORIDE: 101 mmol/L (ref 101–111)
CHLORIDE: 100 mmol/L — AB (ref 101–111)
CO2: 28 mmol/L (ref 22–32)
CO2: 28 mmol/L (ref 22–32)
CO2: 24 mmol/L (ref 22–32)
CREATININE: 0.65 mg/dL (ref 0.44–1.00)
Calcium: 8.3 mg/dL — ABNORMAL LOW (ref 8.9–10.3)
Calcium: 8.6 mg/dL — ABNORMAL LOW (ref 8.9–10.3)
Calcium: 8.3 mg/dL — ABNORMAL LOW (ref 8.9–10.3)
Chloride: 102 mmol/L (ref 101–111)
Creatinine, Ser: 0.7 mg/dL (ref 0.44–1.00)
Creatinine, Ser: 0.72 mg/dL (ref 0.44–1.00)
GFR calc Af Amer: >60 mL/min (ref >60)
GFR calc Af Amer: >60 mL/min (ref >60)
GFR calc non Af Amer: >60 mL/min (ref >60)
GFR calc non Af Amer: >60 mL/min (ref >60)
Glucose, Bld: 192 mg/dL — ABNORMAL HIGH (ref 65–99)
Glucose, Bld: 109 mg/dL — ABNORMAL HIGH (ref 65–99)
Glucose, Bld: 99 mg/dL (ref 65–99)
POTASSIUM: 5.5 mmol/L — AB (ref 3.5–5.1)
POTASSIUM: 5.7 mmol/L — AB (ref 3.5–5.1)
Potassium: 4.8 mmol/L (ref 3.5–5.1)
SODIUM: 134 mmol/L — AB (ref 135–145)
SODIUM: 134 mmol/L — AB (ref 135–145)
SODIUM: 135 mmol/L (ref 135–145)

## 2017-09-18 LAB — BLOOD GAS, ARTERIAL
ACID-BASE DEFICIT: 0.1 mmol/L (ref 0.0–2.0)
ACID-BASE EXCESS: 4.3 mmol/L — AB (ref 0.0–2.0)
BICARBONATE: 30.2 mmol/L — AB (ref 20.0–28.0)
BICARBONATE: 30.3 mmol/L — AB (ref 20.0–28.0)
DRAWN BY: 511551
Drawn by: 51133
FIO2: 60
LHR: 14 {breaths}/min
MECHVT: 480 mL
O2 Content: 5 L/min
O2 SAT: 99.3 %
O2 Saturation: 98.6 %
PEEP: 5 cmH2O
PH ART: 7.028 — AB (ref 7.350–7.450)
PH ART: 7.298 — AB (ref 7.350–7.450)
PO2 ART: 239 mmHg — AB (ref 83.0–108.0)
PO2 ART: 232 mmHg — AB (ref 83.0–108.0)
Patient temperature: 98.6
Patient temperature: 98.6
pCO2 arterial: 63.9 mmHg — ABNORMAL HIGH (ref 32.0–48.0)

## 2017-09-18 LAB — CBC
HCT: 37.7 % (ref 36.0–46.0)
HEMATOCRIT: 37.9 % (ref 36.0–46.0)
Hemoglobin: 11.9 g/dL — ABNORMAL LOW (ref 12.0–15.0)
Hemoglobin: 11.7 g/dL — ABNORMAL LOW (ref 12.0–15.0)
MCH: 31.2 pg (ref 26.0–34.0)
MCH: 30.2 pg (ref 26.0–34.0)
MCHC: 31.6 g/dL (ref 30.0–36.0)
MCHC: 30.9 g/dL (ref 30.0–36.0)
MCV: 98.7 fL (ref 78.0–100.0)
MCV: 97.9 fL (ref 78.0–100.0)
Platelets: 227 10*3/uL (ref 150–400)
Platelets: 161 10*3/uL (ref 150–400)
RBC: 3.82 MIL/uL — AB (ref 3.87–5.11)
RBC: 3.87 MIL/uL (ref 3.87–5.11)
RDW: 13.8 % (ref 11.5–15.5)
RDW: 13.9 % (ref 11.5–15.5)
WBC: 10.5 10*3/uL (ref 4.0–10.5)
WBC: 8 10*3/uL (ref 4.0–10.5)

## 2017-09-18 LAB — LACTIC ACID, PLASMA: Lactic Acid, Venous: 1.6 mmol/L (ref 0.5–1.9)

## 2017-09-18 LAB — GLUCOSE, CAPILLARY: GLUCOSE-CAPILLARY: 183 mg/dL — AB (ref 65–99)

## 2017-09-18 LAB — MAGNESIUM
Magnesium: 1.6 mg/dL — ABNORMAL LOW (ref 1.7–2.4)
Magnesium: 1.6 mg/dL — ABNORMAL LOW (ref 1.7–2.4)

## 2017-09-18 LAB — PHOSPHORUS
Phosphorus: 3.9 mg/dL (ref 2.5–4.6)
Phosphorus: 3.2 mg/dL (ref 2.5–4.6)

## 2017-09-18 LAB — CALCIUM, IONIZED: Calcium, Ionized, Serum: 4.7 mg/dL (ref 4.5–5.6)

## 2017-09-18 LAB — PROCALCITONIN: Procalcitonin: <0.1 ng/mL

## 2017-09-18 MED ORDER — ORAL CARE MOUTH RINSE
15.0000 mL | Freq: Two times a day (BID) | OROMUCOSAL | Status: DC
  Administered 2017-09-18 – 2017-09-24 (×11): 15 mL via OROMUCOSAL

## 2017-09-18 MED ORDER — ACETAMINOPHEN 650 MG RE SUPP: 650.0000 mg | RECTAL | Status: DC | PRN

## 2017-09-18 MED ORDER — FENTANYL CITRATE (PF) 100 MCG/2ML IJ SOLN
50.0000 ug | INTRAMUSCULAR | Status: DC | PRN
  Administered 2017-09-18: 50 ug via INTRAVENOUS
  Filled 2017-09-18: qty 2

## 2017-09-18 MED ORDER — FENTANYL CITRATE (PF) 100 MCG/2ML IJ SOLN: 50.0000 ug | INTRAMUSCULAR | Status: DC | PRN

## 2017-09-18 MED ORDER — SODIUM CHLORIDE 0.9 % IV SOLN
INTRAVENOUS | Status: DC
  Administered 2017-09-18: 11:00:00 via INTRAVENOUS

## 2017-09-18 MED ORDER — PHENOL 1.4 % MT LIQD
1.0000 | OROMUCOSAL | Status: DC | PRN
  Administered 2017-09-18: 1 via OROMUCOSAL
  Filled 2017-09-18: qty 177

## 2017-09-18 MED ORDER — LEVOTHYROXINE SODIUM 88 MCG PO TABS
88.0000 ug | ORAL_TABLET | Freq: Every day | ORAL | Status: DC
  Administered 2017-09-18: 88 ug
  Filled 2017-09-18: qty 1

## 2017-09-18 MED ORDER — ORAL CARE MOUTH RINSE
15.0000 mL | Freq: Four times a day (QID) | OROMUCOSAL | Status: DC
  Administered 2017-09-18: 15 mL via OROMUCOSAL

## 2017-09-18 MED ORDER — MAGNESIUM SULFATE 2 GM/50ML IV SOLN
2.0000 g | Freq: Once | INTRAVENOUS | Status: AC
  Administered 2017-09-18: 2 g via INTRAVENOUS
  Filled 2017-09-18: qty 50

## 2017-09-18 MED ORDER — LEVOTHYROXINE SODIUM 88 MCG PO TABS
88.0000 ug | ORAL_TABLET | Freq: Every day | ORAL | Status: DC
  Filled 2017-09-18 (×2): qty 1

## 2017-09-18 MED ORDER — FENTANYL CITRATE (PF) 100 MCG/2ML IJ SOLN
INTRAMUSCULAR | Status: AC
  Filled 2017-09-18: qty 2

## 2017-09-18 MED ORDER — CHLORHEXIDINE GLUCONATE 0.12% ORAL RINSE (MEDLINE KIT)
15.0000 mL | Freq: Two times a day (BID) | OROMUCOSAL | Status: DC
  Administered 2017-09-18: 15 mL via OROMUCOSAL

## 2017-09-18 MED FILL — Medication: Qty: 1 | Status: AC

## 2017-09-18 NOTE — Progress Notes (Signed)
Came to room d/t vent alarming.  Found pt self extubated.  No stridor noted. Dr Ashok Cordia at bedside, states place pt on bipap if needed.  Currently no distress noted, Sat 100%.

## 2017-09-18 NOTE — Progress Notes (Signed)
PCCM Attending Re-Rounding Note:  Patient with normal respiratory effort. Wakes up and responds appropriately with minimal stimulation. Patient reports she is comfortable. Good voice quality. Continuing BiPAP prn. Holding ALL narcotics & sedatives.  Sonia Baller Ashok Cordia, M.D. [facility] Pulmonary & Critical Care Pager:  [phone] After 7pm or if no response, call [phone] 5:20 PM 09/18/17

## 2017-09-18 NOTE — Progress Notes (Signed)
PULMONARY / CRITICAL CARE MEDICINE   Name: Kristin [name] MRN: [id number] DOB: [birth year]    ADMISSION DATE:  09/16/2017 CONSULTATION DATE:  09/16/17  REFERRING MD:  Dr. 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    She has a known history of breast calcification diagnosed approximately 15 years ago which was watched with screening mammograms.  In early November 2018, she had a diagnostic mammogram which showed Irregular hypoechoic mass abutting the skin and the chest wall measuring 1.3 x 0.9 x 1.1 cm at the left breast 4 o'clock 3 cm from nipple.  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   In addition, she has had weight loss (previously 120lbs, now 93lbs).  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  She has mild left sided weakness (prescribed zinc & copper but did not tolerate).    On 11/19, she underwent a left mastectomy with deep left axillary sentinel lymph node biopsy & dissection.  Post-operatively, she was extubated but had respiratory distress and was placed on BiPAP.  ABG on BiPAP (7.244 / 72 / 154 / 30) was consistent with hypercarbic respiratory failure.    PCCM consulted for evaluation.     SUBJECTIVE:  Had been transferred out of ICU 11/20 but later transferred back and intubated due to hypercapnic respiratory failure. Was caught smoking in ICU prior to transfer out.  VITAL SIGNS: BP (!) 73/57   Pulse 96   Temp 98.6 F (37 C) (Oral)   Resp (!) 26   Ht 5\' 6"  (1.676 m)   Wt 48 kg (105 lb 13.1 oz)   SpO2 100%   BMI 17.08 kg/m   HEMODYNAMICS:    VENTILATOR SETTINGS: Vent  Mode: PRVC FiO2 (%):  [40 %-60 %] 40 % Set Rate:  [14 bmp] 14 bmp Vt Set:  [480 mL] 480 mL PEEP:  [5 cmH20] 5 cmH20 Plateau Pressure:  [18 cmH20-251 cmH20] 251 cmH20  INTAKE / OUTPUT: I/O last 3 completed shifts: In: 2546.7 [P.O.:150; I.V.:2396.7] Out: 1545 [Urine:950; Emesis/NG output:500; Drains:95]  PHYSICAL EXAMINATION: General: Adult female, no distress HEENT: MM pink/moist no JVD  Neuro:  Opens eyes to voice, follows some basic commands CV: RRR PULM: Diminished in bases HK:VQQV, non-tender, bsx4 active  Extremities: warm/dry, no edema Skin: Left mastectomy dressing dry and intact, drains in place  LABS:  BMET Recent Labs  Lab 09/18/17 0311 09/18/17 0649  NA 134* 134*  K 5.5* 5.7*  CL 101 100*  CO2 28 28  BUN 6 6  CREATININE 0.70 0.72  GLUCOSE 192* 109*    Electrolytes Recent Labs  Lab 09/18/17 0311 09/18/17 0649  CALCIUM 8.3* 8.6*  MG 1.6* 1.6*  PHOS 3.9 3.2    CBC Recent Labs  Lab 09/18/17 0311 09/18/17 0649  WBC 10.5 8.0  HGB 11.9* 11.7*  HCT 37.7 37.9  PLT 227 161    Coag's No results for input(s): APTT, INR in the last 168 hours.  Sepsis Markers Recent Labs  Lab 09/18/17 0311 09/18/17 0320  LATICACIDVEN  --  1.6  PROCALCITON <0.10  --  ABG Recent Labs  Lab 09/16/17 1550 09/18/17 0305 09/18/17 0550  PHART 7.275* 7.028* 7.298*  PCO2ART 68.1* VALUE ABOVE REPORTABLE RANGE 63.9*  PO2ART 105 239* 232*    Liver Enzymes No results for input(s): AST, ALT, ALKPHOS, BILITOT, ALBUMIN in the last 168 hours.  Cardiac Enzymes No results for input(s): TROPONINI, PROBNP in the last 168 hours.  Glucose Recent Labs  Lab 09/16/17 1635 09/18/17 0309  GLUCAP 122* 183*    Imaging Dg Chest Port 1 View  Result Date: 09/18/2017 CLINICAL DATA:  Intubation EXAM: PORTABLE CHEST 1 VIEW COMPARISON:  Chest CT 04/05/2017 FINDINGS: Large biapical bullae are again seen, as on the prior chest CT. The endotracheal tube tip is proximally 6 cm  above the level of the carina. The lungs are hyperexpanded. Cardiomediastinal contours are normal. IMPRESSION: Endotracheal tube tip greater than 5 cm above the inferior carina. COPD without acute airspace disease. Electronically Signed   By: Ulyses Jarred M.D.   On: 09/18/2017 03:40   Dg Abd Portable 1v  Result Date: 09/18/2017 CLINICAL DATA:  Orogastric tube placement EXAM: PORTABLE ABDOMEN - 1 VIEW COMPARISON:  CT abdomen and pelvis February 07, 2017 FINDINGS: Orogastric tube tip is in the proximal stomach with the side port at the gastroesophageal junction. There are loops of mildly dilated bowel in the upper abdomen. No free air evident. IMPRESSION: Orogastric tube tip in proximal stomach with side port at gastroesophageal junction. Advise advancing nasogastric tube 6-8 cm to confirm that both nasogastric tube tip and side port are within the stomach. Mild bowel dilatation. There may be a degree of underlying ileus. No free air evident. Electronically Signed   By: Lowella Grip III M.D.   On: 09/18/2017 07:19    STUDIES:    CULTURES:   ANTIBIOTICS: Peri-op ancef  SIGNIFICANT EVENTS: 11/19 admit for masectomy  LINES/TUBES: JP drain L breast 11/19 >  DISCUSSION: 71 year old female with end stage COPD and recent diagnosis breast Ca admitted 11/19 for L mastectomy. Post op she was somewhat drowsy with resp acidosis. She was started on BiPAP and admitted to ICU by gen surgery.  Awake alert 09/17/2017.  Angry because she was confronted smoking in the bathroom by the nurses. Transferred out of ICU 11/20 but overnight required transfer back due to acute hypercapnic respiratory failure  ASSESSMENT / PLAN:  Acute on chronic hypercarbic respiratory failure likely r/t post op hypoventilation in end stage COPD.  Required intubation overnight 11/20.  Of note, caught with a cigarette in the ICU bathroom 09/17/2017. COPD GOLD D without acute exacerbation. 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 11/20, no family available 11/21.  - Inter-disciplinary family meet or Palliative Care meeting due by:  11/26   Montey Hora, [place] Pulmonary & Critical Care Medicine Pager: [phone]  or [phone] 09/18/2017, 10:37 AM

## 2017-09-18 NOTE — Progress Notes (Addendum)
Received call from 93M RN, patient had mental status and was in respiratory distress, RNs had notified [place].  I was with another patient and asked that primary service be notified, instructed RNs to have RT obtain an ABG, and I would come as soon as I could. When I arrived, ICU rounding team and CCS MD on call were at the bedside, decision made for patient to be intubated.    RSI performed, patient was successfully intubated, hemodynamics remained stable, CXR/labs taken and patient was transferred to 2M11.   Of note, patient has not been wearing oxygen and not using BIPAP, ABG post intubation CO2 undetectable.  RRT Interventions: -- Assisted in RSI and transferred to ICU.    Start Time 254 End Time 345

## 2017-09-18 NOTE — Progress Notes (Signed)
Pt Oxygen saturation dropped down into the 80s. Attempted to put her on a venti mask and quickly recovered. Then removed venti mask and dropped again. Tried to reapply nasal cannula but pt. Continued to refuse supplemental oxygen. Pt. Oxygen saturation then dropped to 40s. Then reapplied nasal cannula at 6L and pt. Oxygen saturation returned to baseline.

## 2017-09-18 NOTE — Progress Notes (Addendum)
Called by nurse  Pt found with respiratory distress and less responsive.   CCM and rapid response called and patient intubated due to respiratory failure  ABG shows significant CO2 retention Pt caught smoking in ICU earlier and refusing BIPAP Labs/CXR/ EKG/ ORDERED  Transferred to medical ICU for vent management.  No signs of bleeding at mastectomy site  Stable after securing airway.  Attempts to contact patient's family go to voicemail.

## 2017-09-18 NOTE — Progress Notes (Signed)
PCCM Progress Note  71 y.o. female with ongoing tobacco use and known underlying severe COPD with pulmonary emphysema. Admitted for Left mastectomy. Patient was extubated post op and monitored in the ICU and eventually downgraded to the floor.   Overnight patient constantly took of her oxygen. She also slowly became more unresponsive and CCM was called.   On arrival patient was unresponsive to verbal and painful stimuli. Patient was being bagged by RT SaO2 100%.   Patient was intubated for airway protection. ABG done immediatly after intubation showed PCO2 that was too high for the machine to read.   AMS secondary to acute on chronic hypercapnic respiratory failure.   Patient will be transferred to the ICU for further management  Rojelio Brenner Pulmonary Critical Care Pager: [phone]

## 2017-09-18 NOTE — Procedures (Signed)
Endotracheal Intubation Procedure Note  Indication for endotracheal intubation: AMS secondary to acute on chronic hypercapnic respiratory failure  Airway Assessment: Mallampati Class: I (soft palate, uvula, fauces, and tonsillar pillars visible). Sedation: etomidate 20 mg and fentanyl 50 mg  Paralytic: none. Lidocaine: no. Atropine: no. Equipment: Macintosh 4 laryngoscope blade.  7.5 ET Cricoid Pressure: no. Number of attempts: 1. ETT location confirmed by by CXR.  Carlyon Prows 09/18/2017

## 2017-09-18 NOTE — Progress Notes (Signed)
Pt. Continues to complain surgical acute pain at the left breast, comfort care and distractions method implemented per plan of care; some relief noted. Pt. rested for periods of time and vitals remained stable at baseline. Pt refused tylenol suppository for pain, pt. States that tylenol is not effective and she prefers the "strong stuff".  Nurse educated pt. About opioids and narcotics effects on respiration status and the need for Bipap use.  Pt verbalized understanding and agrees to continue using non pharmacological methods for pain discomfort. E-link and MD notified and aware.  Nurse will continue to monitor respiratory status and vitals and maintain safety. Call be in reach.

## 2017-09-18 NOTE — Progress Notes (Signed)
Pt placed on BiPAP per request.  Pt stated that she could not breath and RT adjusted setting and mask for comfort.  After adjustments, pt stated that she did not like the mask on her face, wanted it off and just wanted her inhaler because it helps her breathe. RT removed mask, placed pt on 3L [place] and neb treatment given.  Pt states that this works because she just takes treatments at home to help her breathe. Pt also states that it is a lot more comfortable.

## 2017-09-18 NOTE — Progress Notes (Signed)
Pt is resting comfortably on 3L [place]. BiPAP is not indicated at this time and is on standby in room. RT will continue to monitor pt and will place if needed.

## 2017-09-18 NOTE — Progress Notes (Signed)
2 Days Post-Op   Subjective/Chief Complaint: Had to be reintubated yesterday. Awake and alert.   Objective: Vital signs in last 24 hours: Temp:  [98 F (36.7 C)-98.7 F (37.1 C)] 98.6 F (37 C) (11/21 0816) Pulse Rate:  [80-110] 84 (11/21 0800) Resp:  [14-30] 16 (11/21 0800) BP: (71-137)/(48-72) 76/58 (11/21 0800) SpO2:  [89 %-100 %] 100 % (11/21 0800) FiO2 (%):  [40 %-60 %] 40 % (11/21 0605) Weight:  [46.7 kg (102 lb 15.3 oz)-48 kg (105 lb 13.1 oz)] 48 kg (105 lb 13.1 oz) (11/21 0417) Last BM Date: 09/13/17  Intake/Output from previous day: 11/20 0701 - 11/21 0700 In: 1450 [P.O.:150; I.V.:1300] Out: 1135 [Urine:600; Emesis/NG output:500; Drains:35] Intake/Output this shift: Total I/O In: 100 [I.V.:100] Out: 75 [Urine:75]  General appearance: alert, cooperative and fatigued Resp: clear to auscultation bilaterally and on vent Chest wall: skin flaps look ok Cardio: regular rate and rhythm GI: soft, non-tender; bowel sounds normal; no masses,  no organomegaly  Lab Results:  Recent Labs    09/18/17 0311 09/18/17 0649  WBC 10.5 8.0  HGB 11.9* 11.7*  HCT 37.7 37.9  PLT 227 161   BMET Recent Labs    09/18/17 0311 09/18/17 0649  NA 134* 134*  K 5.5* 5.7*  CL 101 100*  CO2 28 28  GLUCOSE 192* 109*  BUN 6 6  CREATININE 0.70 0.72  CALCIUM 8.3* 8.6*   PT/INR No results for input([name]): LABPROT, INR in the last 72 hours. ABG Recent Labs    09/18/17 0305 09/18/17 0550  PHART 7.028* 7.298*  HCO3 30.2* 30.3*    Studies/Results: Nm Sentinel Node Inj-no Rpt (breast)  Result Date: 09/16/2017 Sulfur colloid was injected by the nuclear medicine technologist for melanoma sentinel node.   Dg Chest Port 1 View  Result Date: 09/18/2017 CLINICAL DATA:  Intubation EXAM: PORTABLE CHEST 1 VIEW COMPARISON:  Chest CT 04/05/2017 FINDINGS: Large biapical bullae are again seen, as on the prior chest CT. The endotracheal tube tip is proximally 6 cm above the level of the  carina. The lungs are hyperexpanded. Cardiomediastinal contours are normal. IMPRESSION: Endotracheal tube tip greater than 5 cm above the inferior carina. COPD without acute airspace disease. Electronically Signed   By: Ulyses Jarred M.D.   On: 09/18/2017 03:40   Dg Abd Portable 1v  Result Date: 09/18/2017 CLINICAL DATA:  Orogastric tube placement EXAM: PORTABLE ABDOMEN - 1 VIEW COMPARISON:  CT abdomen and pelvis February 07, 2017 FINDINGS: Orogastric tube tip is in the proximal stomach with the side port at the gastroesophageal junction. There are loops of mildly dilated bowel in the upper abdomen. No free air evident. IMPRESSION: Orogastric tube tip in proximal stomach with side port at gastroesophageal junction. Advise advancing nasogastric tube 6-8 cm to confirm that both nasogastric tube tip and side port are within the stomach. Mild bowel dilatation. There may be a degree of underlying ileus. No free air evident. Electronically Signed   By: Lowella Grip III M.D.   On: 09/18/2017 07:19    Anti-infectives: Anti-infectives (From admission, onward)   Start     Dose/Rate Route Frequency Ordered Stop   09/16/17 0907  ceFAZolin (ANCEF) IVPB 2g/100 mL premix     2 g 200 mL/hr over 30 Minutes Intravenous On call to O.R. 09/16/17 0907 09/16/17 1220      Assessment/Plan: [name]/p Procedure([name]): LEFT MASTECTOMY WITH SENTINEL LYMPH NODE BIOPSY (Left) Continue drains  VDRF per CCM. Appreciate their assistance Continue to monitor in icu  Consider enteral or parenteral nutrition soon  LOS: 2 days    TOTH III,Kristin [name] 09/18/2017

## 2017-09-18 NOTE — Progress Notes (Deleted)
Per NP CCM, plan to attempt wean on vent today.  Fio2 weaned to 50%, peep remains at 8, psv 5. RN aware.

## 2017-09-18 NOTE — Progress Notes (Signed)
Pt. Self extubate not witness by staff, pt suctioned and [place] place at 2 liters pt. Sat. @100 , new orders place MD at bedside. No adverse effects noted.

## 2017-09-18 NOTE — Progress Notes (Signed)
Kristin [name] in patient room noticed some grunting while breathing, attempted to arouse pt and would not respond to sternal rub  Pt. Sat then began to drop to 80s, elink was paged and respiratory called  0257 Sats then dropped to 40s and Began bagging pt and oxygen saturation came back up to 100 percent, pads applied and back bo 0302 Second IV access obtained (18 G in LAC) 0311 50 fentanyl and timeout preformed  0313 20 etomidate given  0314 pt intubated and began mechanical ventilation    131/71 0313 HR 123  118/90 0316

## 2017-09-19 ENCOUNTER — Other Ambulatory Visit: Payer: Self-pay

## 2017-09-19 ENCOUNTER — Inpatient Hospital Stay (HOSPITAL_COMMUNITY): Payer: Medicare HMO

## 2017-09-19 LAB — RENAL FUNCTION PANEL
ALBUMIN: 3.1 g/dL — AB (ref 3.5–5.0)
ANION GAP: 6 (ref 5–15)
BUN: 8 mg/dL (ref 6–20)
CO2: 29 mmol/L (ref 22–32)
Calcium: 8 mg/dL — ABNORMAL LOW (ref 8.9–10.3)
Chloride: 101 mmol/L (ref 101–111)
Creatinine, Ser: 0.7 mg/dL (ref 0.44–1.00)
Glucose, Bld: 70 mg/dL (ref 65–99)
PHOSPHORUS: 3.3 mg/dL (ref 2.5–4.6)
POTASSIUM: 4.2 mmol/L (ref 3.5–5.1)
Sodium: 136 mmol/L (ref 135–145)

## 2017-09-19 LAB — GLUCOSE, CAPILLARY
GLUCOSE-CAPILLARY: 125 mg/dL — AB (ref 65–99)
GLUCOSE-CAPILLARY: 88 mg/dL (ref 65–99)
Glucose-Capillary: 61 mg/dL — ABNORMAL LOW (ref 65–99)
Glucose-Capillary: 125 mg/dL — ABNORMAL HIGH (ref 65–99)

## 2017-09-19 LAB — CBC
HEMATOCRIT: 34 % — AB (ref 36.0–46.0)
HEMOGLOBIN: 10.7 g/dL — AB (ref 12.0–15.0)
MCH: 30.8 pg (ref 26.0–34.0)
MCHC: 31.5 g/dL (ref 30.0–36.0)
MCV: 98 fL (ref 78.0–100.0)
Platelets: 176 10*3/uL (ref 150–400)
RBC: 3.47 MIL/uL — AB (ref 3.87–5.11)
RDW: 14.2 % (ref 11.5–15.5)
WBC: 6 10*3/uL (ref 4.0–10.5)

## 2017-09-19 LAB — PROCALCITONIN: Procalcitonin: 0.11 ng/mL

## 2017-09-19 LAB — MAGNESIUM: MAGNESIUM: 2 mg/dL (ref 1.7–2.4)

## 2017-09-19 MED ORDER — DEXTROSE 50 % IV SOLN
25.0000 mL | Freq: Once | INTRAVENOUS | Status: AC
  Administered 2017-09-19: 25 mL via INTRAVENOUS

## 2017-09-19 MED ORDER — DEXTROSE 50 % IV SOLN
INTRAVENOUS | Status: AC
  Filled 2017-09-19: qty 50

## 2017-09-19 MED ORDER — NYSTATIN 100000 UNIT/ML MT SUSP
5.0000 mL | Freq: Four times a day (QID) | OROMUCOSAL | Status: DC
  Administered 2017-09-19 – 2017-09-24 (×17): 500000 [IU] via OROMUCOSAL
  Filled 2017-09-19 (×20): qty 5

## 2017-09-19 MED ORDER — WHITE PETROLATUM EX OINT
TOPICAL_OINTMENT | CUTANEOUS | Status: AC
  Administered 2017-09-19: 11:00:00
  Filled 2017-09-19: qty 28.35

## 2017-09-19 MED ORDER — SODIUM CHLORIDE 0.9 % IV BOLUS (SEPSIS)
500.0000 mL | Freq: Once | INTRAVENOUS | Status: AC
  Administered 2017-09-19: 500 mL via INTRAVENOUS

## 2017-09-19 MED ORDER — TIOTROPIUM BROMIDE-OLODATEROL 2.5-2.5 MCG/ACT IN AERS
2.0000 | INHALATION_SPRAY | Freq: Every day | RESPIRATORY_TRACT | Status: DC
  Administered 2017-09-19 – 2017-09-20 (×2): 2 via RESPIRATORY_TRACT

## 2017-09-19 NOTE — Progress Notes (Signed)
Hypoglycemic Event  CBG: 61  Treatment: D50 IV 25 mL  Symptoms: None  Follow-up CBG: Time:2041 CBG Result:125  Possible Reasons for Event: Inadequate meal intake    Kristin [name] A Violeta Lecount

## 2017-09-19 NOTE — Progress Notes (Signed)
SLP Cancellation Note  Patient Details Name: Kristin [name] MRN: [id number] DOB: 05/21/1946   Cancelled treatment:       Reason Eval/Treat Not Completed: Medical issues which prohibited therapy. Attempted to see pt again this afternoon but she is now on BiPAP. Recommend to hold POs until her respiratory status becomes more stable. Will f/u for readiness.   Germain Osgood 09/19/2017, 12:18 PM  Germain Osgood, M.A. CCC-SLP [phone]

## 2017-09-19 NOTE — Progress Notes (Signed)
Pt using accessory muscles to breathe. Pt given neb treatment and discussed placing pt on BiPAP. Pt declined the use of BiPAP stating that her stomach was hurting and she was coughing before I came in as to the reason for her increased work of breathing.

## 2017-09-19 NOTE — Progress Notes (Signed)
Pt with worsening shortness of breath, has been tachypneic since attempting to mobilize patient at 0800. PRN breathing treatment administered with improvement in symptoms. At approximately 1145 NT came to RN to report pt c/o "can't breathe" upon RN entering room, 02 sats 85% on 2L [place], RR 43. Pt sitting straight up in bed, accessory muscle use noted, c/o "i'm hot."Attempted to place bipap and pt resisted, RT called to beside to assist with placement. Pt with improved 02 sats and RR after placed on bipap. Current VS: HR 103 RR 23 02 sats 100% BP 114/70. Nursing to continue to monitor pt respiratory effort. MD notified

## 2017-09-19 NOTE — Progress Notes (Signed)
SLP Cancellation Note  Patient Details Name: Kristin [name] MRN: [id number] DOB: 05/26/1946   Cancelled treatment:       Reason Eval/Treat Not Completed: Medical issues which prohibited therapy. Orders received for repeat swallowing assessment s/p brief intubation and self-extubation. This morning pt's RR is hovering in the 30s at rest. Per RN, will get pt a breathing tx first and then if respirations stabilize more, will proceed with PO trials.   Germain Osgood 09/19/2017, 8:15 AM  Germain Osgood, M.A. CCC-SLP [phone]

## 2017-09-19 NOTE — Progress Notes (Signed)
3 Days Post-Op  Subjective: Extubated.  Alert.  Phonates fairly well.  SPO2 100%. ABG yesterday with pH 7.29, PCO2 63, PO2 232. No wound problems.  85 mL out drains last 24 hours. Still has Foley.  Has not been out of bed. Using BiPAP as needed.  Holding all narcotics and sedatives because of chronic hypercarbia. Hemoglobin 10.7.  Potassium 4.2.  Creatinine 0.7.  Glucose 70.  Pro-calcitonin 0.11.  Objective: Vital signs in last 24 hours: Temp:  [98.3 F (36.8 C)-100.8 F (38.2 C)] 98.3 F (36.8 C) (11/22 0427) Pulse Rate:  [84-129] 101 (11/22 0700) Resp:  [14-39] 21 (11/22 0700) BP: (73-132)/(48-79) 97/59 (11/22 0700) SpO2:  [94 %-100 %] 100 % (11/22 0700) FiO2 (%):  [40 %] 40 % (11/21 1030) Weight:  [46.3 kg (102 lb 1.2 oz)] 46.3 kg (102 lb 1.2 oz) (11/22 0500) Last BM Date: 09/13/17  Intake/Output from previous day: 11/21 0701 - 11/22 0700 In: 587.2 [I.V.:587.2] Out: 655 [Urine:570; Drains:85] Intake/Output this shift: No intake/output data recorded.  General appearance: Alert.  Depressed affect.  Low BMI.  Does not appear to be in any respiratory distress Resp: Lungs seem clear with normal work of breathing. Chest wall: no tenderness, Left mastectomy wound looks very good.  Skin flaps healthy and viable.  No hematoma.  Drainage is serosanguineous.  No arm swelling. GI: soft, non-tender; bowel sounds normal; no masses,  no organomegaly  Lab Results:  Results for orders placed or performed during the hospital encounter of 09/16/17 (from the past 24 hour(s))  Basic metabolic panel     Status: Abnormal   Collection Time: 09/18/17  4:58 PM  Result Value Ref Range   Sodium 135 135 - 145 mmol/L   Potassium 4.8 3.5 - 5.1 mmol/L   Chloride 102 101 - 111 mmol/L   CO2 24 22 - 32 mmol/L   Glucose, Bld 99 65 - 99 mg/dL   BUN 7 6 - 20 mg/dL   Creatinine, Ser 0.65 0.44 - 1.00 mg/dL   Calcium 8.3 (L) 8.9 - 10.3 mg/dL   GFR calc non Af Amer >60 >60 mL/min   GFR calc Af Amer >60 >60  mL/min   Anion gap 9 5 - 15  Procalcitonin     Status: None   Collection Time: 09/19/17  2:23 AM  Result Value Ref Range   Procalcitonin 0.11 ng/mL  Magnesium     Status: None   Collection Time: 09/19/17  2:23 AM  Result Value Ref Range   Magnesium 2.0 1.7 - 2.4 mg/dL  CBC     Status: Abnormal   Collection Time: 09/19/17  2:23 AM  Result Value Ref Range   WBC 6.0 4.0 - 10.5 K/uL   RBC 3.47 (L) 3.87 - 5.11 MIL/uL   Hemoglobin 10.7 (L) 12.0 - 15.0 g/dL   HCT 34.0 (L) 36.0 - 46.0 %   MCV 98.0 78.0 - 100.0 fL   MCH 30.8 26.0 - 34.0 pg   MCHC 31.5 30.0 - 36.0 g/dL   RDW 14.2 11.5 - 15.5 %   Platelets 176 150 - 400 K/uL  Renal function panel     Status: Abnormal   Collection Time: 09/19/17  2:23 AM  Result Value Ref Range   Sodium 136 135 - 145 mmol/L   Potassium 4.2 3.5 - 5.1 mmol/L   Chloride 101 101 - 111 mmol/L   CO2 29 22 - 32 mmol/L   Glucose, Bld 70 65 - 99 mg/dL   BUN  8 6 - 20 mg/dL   Creatinine, Ser 0.70 0.44 - 1.00 mg/dL   Calcium 8.0 (L) 8.9 - 10.3 mg/dL   Phosphorus 3.3 2.5 - 4.6 mg/dL   Albumin 3.1 (L) 3.5 - 5.0 g/dL   GFR calc non Af Amer >60 >60 mL/min   GFR calc Af Amer >60 >60 mL/min   Anion gap 6 5 - 15     Studies/Results: Dg Chest Port 1 View  Result Date: 09/19/2017 CLINICAL DATA:  Short of breath.  Ventilator dependent EXAM: PORTABLE CHEST 1 VIEW COMPARISON:  09/18/2017 FINDINGS: Endotracheal tube no longer visualized. New severe apical emphysema unchanged. Bibasilar airspace disease right greater than left with mild progression. Negative for effusion IMPRESSION: Bibasilar airspace disease with mild progression. Endotracheal tube no longer visualized. Electronically Signed   By: Franchot Gallo M.D.   On: 09/19/2017 06:48    . heparin  5,000 Units Subcutaneous Q8H  . levothyroxine  88 mcg Oral QAC breakfast  . mouth rinse  15 mL Mouth Rinse BID  . nicotine  7 mg Transdermal Daily  . Tiotropium Bromide-Olodaterol  2 puff Inhalation Daily      Assessment/Plan: s/p Procedure(s): LEFT MASTECTOMY WITH SENTINEL LYMPH NODE BIOPSY  POD #3.  Left mastectomy with sentinel lymph node biopsy. No surgical problems Discontinue Foley Begin diet Out of bed PT and OT consults  Postop acute hypercarbic respiratory failure.  Patient has done reasonably well since self extubation yesterday Discussed with Dr. Ashok Cordia. 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 .prob

## 2017-09-19 NOTE — Progress Notes (Signed)
Educated patient on the benefits of wearing BiPAP.  Pt continues to refuse the BiPAP stating "its just so awful."  Pt on [place] 4L with O2 sats at 100%.  Pt is still very SOB, respirations in 20s. Will attempt to reeducate later.

## 2017-09-19 NOTE — Progress Notes (Signed)
[facility] Attending Note  ADMISSION DATE:09/16/2017  CONSULTATION DATE:09/16/2017  REFERRING MD:Dr. Marlou Starks / [facility]   CHIEF COMPLAINT:Acute Hypercarbic Respiratory Failure   Presenting HPI:  71 y.o. female with ongoing tobacco use and known underlying severe COPD with pulmonary emphysema. Patient with diagnosis of invasive ductal carcinoma of the left breast was admitted yesterday for left mastectomy. Patient also underwent sentinel lymph node biopsy and dissection on the left. Postoperatively patient was extubated but had respiratory distress and acute hypercarbic respiratory failure requiring noninvasive positive pressure ventilation. Patient was subsequently transitioned to the ICU and PCCM was consulted for assistance in treatment.  Subjective:  No acute events overnight. Patient did not require BiPAP overnight. Denies any chest pain or pressure. Denies any difficulty breathing or significant cough.  Review of Systems:  No abdominal pain or nausea. No subjective fever or chills.  Vent Mode: PSV;CPAP FiO2 (%):  [40 %] 40 % PEEP:  [5 cmH20] 5 cmH20 Pressure Support:  [5 cmH20-10 cmH20] 5 cmH20  Temp:  [98.1 F (36.7 C)-100.8 F (38.2 C)] 98.1 F (36.7 C) (11/22 0759) Pulse Rate:  [96-129] 98 (11/22 0900) Resp:  [14-39] 22 (11/22 0900) BP: (73-132)/(48-79) 96/57 (11/22 0900) SpO2:  [94 %-100 %] 100 % (11/22 0900) FiO2 (%):  [40 %] 40 % (11/21 1030) Weight:  [102 lb 1.2 oz (46.3 kg)] 102 lb 1.2 oz (46.3 kg) (11/22 0500)  General:  No family at bedside. Sleeping until awoken. No distress. Integument:  Warm & dry. No rash on exposed skin. HEENT:  Moist mucus memebranes. No scleral icterus. Neurological:  Grossly nonfocal. Following commands. Pulmonary:  Normal work of breathing on nasal cannula. Clear to auscultation bilaterally. Cardiovascular:  Regular rate. No appreciable JVD.  Abdomen:  Soft. Nondistended. Normoactive bowel  sounds.  LINES/TUBES: OETT 11/21 - 11/21 (self-extubated) Left JP Drains 11/19 >>> PIV  CBC Latest Ref Rng & Units 09/19/2017 09/18/2017 09/18/2017  WBC 4.0 - 10.5 K/uL 6.0 8.0 10.5  Hemoglobin 12.0 - 15.0 g/dL 10.7(L) 11.7(L) 11.9(L)  Hematocrit 36.0 - 46.0 % 34.0(L) 37.9 37.7  Platelets 150 - 400 K/uL 176 161 227   BMP Latest Ref Rng & Units 09/19/2017 09/18/2017 09/18/2017  Glucose 65 - 99 mg/dL 70 99 109(H)  BUN 6 - 20 mg/dL 8 7 6   Creatinine 0.44 - 1.00 mg/dL 0.70 0.65 0.72  Sodium 135 - 145 mmol/L 136 135 134(L)  Potassium 3.5 - 5.1 mmol/L 4.2 4.8 5.7(H)  Chloride 101 - 111 mmol/L 101 102 100(L)  CO2 22 - 32 mmol/L 29 24 28   Calcium 8.9 - 10.3 mg/dL 8.0(L) 8.3(L) 8.6(L)   Hepatic Function Latest Ref Rng & Units 09/19/2017 05/31/2017 02/05/2017  Total Protein 6.0 - 8.3 g/dL - 7.4 7.5  Albumin 3.5 - 5.0 g/dL 3.1(L) 4.4 4.5  AST 0 - 37 U/L - 23 22  ALT 0 - 35 U/L - 16 13  Alk Phosphatase 39 - 117 U/L - 63 66  Total Bilirubin 0.2 - 1.2 mg/dL - 1.2 1.2  Bilirubin, Direct 0.0 - 0.3 mg/dL - - 0.3    IMAGING/STUDIES: PORT CXR 11/21:  Previously reviewed by me. Endotracheal tube in good position. No focal opacity or pleural effusion appreciated. Hyperinflation suggested. PORT CXR 11/22:  Personally reviewed by me. Endotracheal tube removed. Silhouetting of right hemidiaphragm is new. Suggesting atelectasis or evolving opacity/effusion. No other new focal opacity appreciated.   MICROBIOLOGY: MRSA PCR 11/19:  Negative   ANTIBIOTICS: Ancef (periop)  SIGNIFICANT EVENTS: 11/19 -  Admit for mastectomy 11/21 - Readmit to ICU and intubation for worsening hypercarbia after Morphine 62m IV earlier in evening  ASSESSMENT/PLAN:  71y.o.  female status post left mastectomy with history of severe COPD and ongoing tobacco use. Self extubated yesterday after intubation for acute on chronic hypercarbic respiratory failure secondary to narcotic administration for pain control. Respiratory  status has remained stable post self extubation.  1. Acute on chronic hypercarbic respiratory failure: Avoiding narcotics completely. Continuing incentive spirometry. Continuing Stiolto and treatment of COPD as follows. 2. Severe COPD: No signs of acute exacerbation. Continuing on Stiolto. Albuterol nebulized when necessary. BiPAP as needed for increased work of breathing or hypercarbia. 3. Postoperative pain: Continuing Tylenol only. Avoiding narcotics given increased sedation.  4. Tobacco use disorder: Continuing nicotine replacement with patches daily.  5. Essential hypertension: Currently normotensive. Holding home antihypertensive regimen. 6. Acute encephalopathy: Resolved. Secondary to hypercarbia. 7. Hypothyroidism: Continuing on Synthroid. 8. Invasive ductal carcinoma: Status post left mastectomy. Postoperative management per primary service.  Prophylaxis: Heparin 5000 units subcutaneous every 8 hours & SCDs. Diet:  Clear liquid diet as tolerated.  Code Status:  Full Code.  Disposition:  Transferring patient to SDU. PT consulted by primary service. Family Update:  Patient updated during rounds today.  I have spent a total of 36 minutes of time today caring for the patient, reviewing the patient's electronic medical record, and with more than 50% of that time spent coordinating transfer of care with the patient as well as reviewing the continuing plan of care with the patient at bedside.  Remainder of care as per primary service. PCCM will continue to follow.   JSonia BallerNAshok Cordia M.D. LUniversity Of [facility] HospitalP[facility] & [facility] Care Pager:  [phone] 7pm or if no response, call [phone] 9:36 AM 09/19/17

## 2017-09-20 DIAGNOSIS — J449 Chronic obstructive pulmonary disease, unspecified: Secondary | ICD-10-CM

## 2017-09-20 LAB — MAGNESIUM: MAGNESIUM: 1.9 mg/dL (ref 1.7–2.4)

## 2017-09-20 LAB — RENAL FUNCTION PANEL
ALBUMIN: 3.1 g/dL — AB (ref 3.5–5.0)
Anion gap: 6 (ref 5–15)
BUN: 14 mg/dL (ref 6–20)
CALCIUM: 8.6 mg/dL — AB (ref 8.9–10.3)
CHLORIDE: 100 mmol/L — AB (ref 101–111)
CO2: 34 mmol/L — ABNORMAL HIGH (ref 22–32)
CREATININE: 0.63 mg/dL (ref 0.44–1.00)
Glucose, Bld: 90 mg/dL (ref 65–99)
Phosphorus: 2.8 mg/dL (ref 2.5–4.6)
Potassium: 4.3 mmol/L (ref 3.5–5.1)
SODIUM: 140 mmol/L (ref 135–145)

## 2017-09-20 LAB — CBC
HCT: 34.8 % — ABNORMAL LOW (ref 36.0–46.0)
Hemoglobin: 11 g/dL — ABNORMAL LOW (ref 12.0–15.0)
MCH: 30.9 pg (ref 26.0–34.0)
MCHC: 31.6 g/dL (ref 30.0–36.0)
MCV: 97.8 fL (ref 78.0–100.0)
PLATELETS: 188 10*3/uL (ref 150–400)
RBC: 3.56 MIL/uL — AB (ref 3.87–5.11)
RDW: 14 % (ref 11.5–15.5)
WBC: 4.7 10*3/uL (ref 4.0–10.5)

## 2017-09-20 LAB — GLUCOSE, CAPILLARY: GLUCOSE-CAPILLARY: 84 mg/dL (ref 65–99)

## 2017-09-20 LAB — PROCALCITONIN

## 2017-09-20 MED ORDER — IPRATROPIUM-ALBUTEROL 0.5-2.5 (3) MG/3ML IN SOLN
3.0000 mL | RESPIRATORY_TRACT | Status: DC
  Administered 2017-09-20 – 2017-09-21 (×6): 3 mL via RESPIRATORY_TRACT
  Filled 2017-09-20 (×7): qty 3

## 2017-09-20 MED ORDER — DEXAMETHASONE SODIUM PHOSPHATE 4 MG/ML IJ SOLN
2.0000 mg | Freq: Two times a day (BID) | INTRAMUSCULAR | Status: AC
  Administered 2017-09-20 – 2017-09-21 (×4): 2 mg via INTRAVENOUS
  Filled 2017-09-20: qty 0.5
  Filled 2017-09-20: qty 1
  Filled 2017-09-20: qty 0.2
  Filled 2017-09-20 (×2): qty 1
  Filled 2017-09-20: qty 0.5

## 2017-09-20 MED ORDER — LEVOTHYROXINE SODIUM 100 MCG IV SOLR
44.0000 ug | Freq: Every day | INTRAVENOUS | Status: DC
Start: 2017-09-20 — End: 2017-09-21
  Administered 2017-09-20 – 2017-09-21 (×2): 44 ug via INTRAVENOUS
  Filled 2017-09-20 (×2): qty 5

## 2017-09-20 NOTE — Evaluation (Signed)
Occupational Therapy Evaluation Patient Details Name: Kristin [name] MRN: [id number] DOB: 04-Oct-1946 Today's Date: 09/20/2017    History of Present Illness 71 y.o. female with breast cancer, ongoing tobacco use, and known underlying severe COPD with pulmonary emphysema. Patient admitted yesterday for left mastectomy with sentinel lymph node biopsy. Postoperatively patient was extubated but had respiratory distress and acute hypercarbic respiratory failure, AMS,  requiring noninvasive positive pressure ventilation. Pt self-extubated on 11/21.   Clinical Impression   PTA Pt independent in ADL and functional transfers. Pt still driving and community ambulator. Pt currently greatly limited by SOB, RR inc to 48, SpO2 dec into 70s. Pt only able to sit EOB, and pain impacting Pt's participation in seating grooming and ADL tasks, unable to transfer OOB at this time. RN placed pt back on BiPap. Anticipate once breathing under control pt will progress well. Pt will benefit from skilled OT in the acute setting and afterwards at SNF level to maximize safety and independence in ADL as well as continue education post sx for movement restrictions. Next session to provide HEp for LUE focusing on edema management, ROM for hand, wrist, and elbow shoulder flex/abd to 90 degrees.    Follow Up Recommendations  SNF    Equipment Recommendations  Other (comment)(defer to next venue)    Recommendations for Other Services       Precautions / Restrictions Precautions Precautions: Other (comment) Precaution Comments: watch O2 sats, limited L shoulder flex/abd to 90 deg Restrictions Weight Bearing Restrictions: No      Mobility Bed Mobility Overal bed mobility: Needs Assistance Bed Mobility: Supine to Sit;Sit to Supine     Supine to sit: Min assist Sit to supine: Min assist   General bed mobility comments: increased time due to + SOB, assist for trunk elevation to EOB and LE management back into bed.  assist to scoot to EOB to place feet on floor. Pt with + SOB  Transfers                 General transfer comment: pt unable due to inc RR to 48 RRM, HR 111-114    Balance Overall balance assessment: Needs assistance Sitting-balance support: Feet supported;Single extremity supported Sitting balance-Leahy Scale: Poor Sitting balance - Comments: pt has to lean to the R to minimize abdominal pain and support self due to SOB Postural control: Right lateral lean(for pain management, rolling slightly to right supine as wel)     Standing balance comment: unable to this date                           ADL either performed or assessed with clinical judgement   ADL Overall ADL's : Needs assistance/impaired Eating/Feeding: Set up;Sitting Eating/Feeding Details (indicate cue type and reason): Pt able to bring hand to mouth for feeding and grasp was weak, but functional on utensils Grooming: Wash/dry face;Set up(applying lip care)   Upper Body Bathing: Moderate assistance   Lower Body Bathing: Moderate assistance   Upper Body Dressing : Moderate assistance   Lower Body Dressing: Maximal assistance;+2 for safety/equipment   Toilet Transfer: (deferred at this time)   Toileting- Clothing Manipulation and Hygiene: Minimal assistance;Bed level       Functional mobility during ADLs: (deferred at this time)       Vision         Perception     Praxis      Pertinent Vitals/Pain Pain Assessment: 0-10 Pain Score: 8  Pain Location: abdomen Pain Descriptors / Indicators: (like an ulcer just sitting there) Pain Intervention(s): Monitored during session     Hand Dominance Right   Extremity/Trunk Assessment Upper Extremity Assessment Upper Extremity Assessment: LUE deficits/detail;Generalized weakness LUE Deficits / Details: limited shld flexion/abduction to 90 deg per MD post op   Lower Extremity Assessment Lower Extremity Assessment: Generalized weakness    Cervical / Trunk Assessment Cervical / Trunk Assessment: Normal   Communication Communication Communication: No difficulties   Cognition Arousal/Alertness: Awake/alert Behavior During Therapy: WFL for tasks assessed/performed Overall Cognitive Status: Within Functional Limits for tasks assessed                                     General Comments       Exercises Exercises: Other exercises Other Exercises Other Exercises: Educated Pt on hand exercises (make a fist, open up) Other Exercises: Educated Pt on frequent movement of elbow Other Exercises: Educated Pt on shoulder Forward flexion and abduction to 90 degrees to prevent frozen shoulder and for pain/edema management   Shoulder Instructions      Home Living Family/patient expects to be discharged to:: Skilled nursing facility Living Arrangements: Alone                               Additional Comments: pt lived in 1 story home with tub shower, no DME      Prior Functioning/Environment Level of Independence: Independent        Comments: drove, grocery shopped        OT Problem List: Decreased strength;Decreased range of motion;Decreased activity tolerance;Impaired balance (sitting and/or standing);Decreased knowledge of precautions;Cardiopulmonary status limiting activity;Impaired UE functional use;Pain      OT Treatment/Interventions: Self-care/ADL training;Therapeutic exercise;DME and/or AE instruction;Therapeutic activities;Patient/family education;Balance training    OT Goals(Current goals can be found in the care plan section) Acute Rehab OT Goals Patient Stated Goal: improve breathing, stop abdominal pain, and go to rehab OT Goal Formulation: With patient Time For Goal Achievement: 10/04/17 Potential to Achieve Goals: Good ADL Goals Pt Will Perform Grooming: with modified independence;sitting Pt Will Perform Upper Body Bathing: with supervision;sitting Pt Will Perform Lower  Body Bathing: with supervision;sit to/from stand Pt Will Perform Upper Body Dressing: with set-up;sitting Pt Will Transfer to Toilet: with min guard assist;bedside commode Pt Will Perform Toileting - Clothing Manipulation and hygiene: with supervision;sit to/from stand Pt/caregiver will Perform Home Exercise Program: Left upper extremity;Independently;With written HEP provided  OT Frequency: Min 2X/week   Barriers to D/C: Decreased caregiver support  Pt lives alone       Co-evaluation PT/OT/SLP Co-Evaluation/Treatment: Yes Reason for Co-Treatment: Complexity of the patient's impairments (multi-system involvement)   OT goals addressed during session: ADL's and self-care      AM-PAC PT "6 Clicks" Daily Activity     Outcome Measure Help from another person eating meals?: None Help from another person taking care of personal grooming?: A Little Help from another person toileting, which includes using toliet, bedpan, or urinal?: A Lot Help from another person bathing (including washing, rinsing, drying)?: A Lot Help from another person to put on and taking off regular upper body clothing?: A Lot Help from another person to put on and taking off regular lower body clothing?: A Lot 6 Click Score: 15   End of Session Equipment Utilized During Treatment: Oxygen Nurse Communication: Mobility  status  Activity Tolerance: Treatment limited secondary to medical complications (Comment)(inc RR to 48 RRM, HR 111-114) Patient left: in bed;with call bell/phone within reach;with bed alarm set;Other (comment)(on Bipap)  OT Visit Diagnosis: Muscle weakness (generalized) (M62.81);Adult, failure to thrive (R62.7);Pain Pain - Right/Left: Left Pain - part of body: Shoulder                Time: 0930-1007 OT Time Calculation (min): 37 min Charges:  OT General Charges $OT Visit: 1 Visit OT Evaluation $OT Eval Moderate Complexity: 1 Mod G-Codes:     Hulda Humphrey OTR/L Jay 09/20/2017, 2:48 PM

## 2017-09-20 NOTE — Progress Notes (Signed)
Transferred patient via bed to 2C12.  Receiving RN and NT present at handoff.  Assessment and vitals stable, see flowsheets.

## 2017-09-20 NOTE — Progress Notes (Addendum)
SLP Cancellation Note  Patient Details Name: Kristin [name] MRN: [id number] DOB: 19-May-1946   Cancelled treatment:       Reason Eval/Treat Not Completed: Medical issues which prohibited therapy - pt on BiPAP this morning but may try to come off later today per RN. Will f/u for readiness.  Attempted to see pt again this afternoon but she remains on BiPAP. Will continue efforts.   Germain Osgood 09/20/2017, 10:57 AM  Germain Osgood, M.A. CCC-SLP [phone]

## 2017-09-20 NOTE — Progress Notes (Signed)
PT and OT attempted to ambulate patient and pt had worsening shortness of breath and tachypnea. Pt with 02 sats dropping into low 80's on 4L [place], RR in 40's. Pt also complaining of worsening belly pain that she has had for several years.  Pt placed on bipap and MD and RT notified. Pt with improved 02 sats and RR after being placed on bipap.

## 2017-09-20 NOTE — Evaluation (Signed)
Physical Therapy Evaluation Patient Details Name: Kristin [name] MRN: [id number] DOB: 1945-11-03 Today's Date: 09/20/2017   History of Present Illness  71 y.o. female with breast cancer, ongoing tobacco use, and known underlying severe COPD with pulmonary emphysema. Patient admitted yesterday for left mastectomy with sentinel lymph node biopsy. Postoperatively patient was extubated but had respiratory distress and acute hypercarbic respiratory failure, AMS,  requiring noninvasive positive pressure ventilation. Pt self-extubated on 11/21.  Clinical Impression  Pt mobility greatly limited by SOB, RR inc to 48, SpO2 dec into 70s. Pt only able to sit EOB, unable to transfer OOB. RN placed pt back on BiPap. Anticipate once breathing under control pt will progress well. Acute PT to follow.    Follow Up Recommendations SNF;Supervision/Assistance - 24 hour    Equipment Recommendations  None recommended by PT(TBD)    Recommendations for Other Services       Precautions / Restrictions Precautions Precautions: Other (comment) Precaution Comments: watch O2 sats, limited L shoulder flex/abd to 90 deg Restrictions Weight Bearing Restrictions: No      Mobility  Bed Mobility Overal bed mobility: Needs Assistance Bed Mobility: Supine to Sit;Sit to Supine     Supine to sit: Min assist Sit to supine: Min assist   General bed mobility comments: increased time due to + SOB, assist for trunk elevation to EOB and LE management back into bed. assist to scoot to EOB to place feet on floor. Pt with + SOB  Transfers                 General transfer comment: pt unable due to inc RR to 48 RRM, HR 111-114  Ambulation/Gait                Stairs            Wheelchair Mobility    Modified Rankin (Stroke Patients Only)       Balance Overall balance assessment: Needs assistance Sitting-balance support: Feet supported;Single extremity supported Sitting balance-Leahy Scale:  Poor Sitting balance - Comments: pt has to lean to the R to minimize abdominal pain and support self due to SOB       Standing balance comment: unable to this date                             Pertinent Vitals/Pain Pain Assessment: 0-10 Pain Score: 8  Pain Location: abdomen Pain Descriptors / Indicators: (like an ulcer just sitting there) Pain Intervention(s): Monitored during session    Home Living Family/patient expects to be discharged to:: Skilled nursing facility Living Arrangements: Alone               Additional Comments: pt lived in 1 story home with tub shower, no DME    Prior Function Level of Independence: Independent         Comments: drove, grocery shopped     Hand Dominance   Dominant Hand: Right    Extremity/Trunk Assessment   Upper Extremity Assessment Upper Extremity Assessment: LUE deficits/detail LUE Deficits / Details: limited shld flexion/abduction to 90 deg per MD    Lower Extremity Assessment Lower Extremity Assessment: Generalized weakness    Cervical / Trunk Assessment Cervical / Trunk Assessment: Normal  Communication   Communication: No difficulties  Cognition Arousal/Alertness: Awake/alert Behavior During Therapy: WFL for tasks assessed/performed Overall Cognitive Status: Within Functional Limits for tasks assessed  General Comments      Exercises     Assessment/Plan    PT Assessment Patient needs continued PT services  PT Problem List Decreased strength;Decreased activity tolerance;Decreased balance;Cardiopulmonary status limiting activity;Decreased mobility;Decreased knowledge of use of DME;Decreased safety awareness       PT Treatment Interventions DME instruction;Functional mobility training;Gait training;Therapeutic activities;Therapeutic exercise;Balance training    PT Goals (Current goals can be found in the Care Plan section)  Acute Rehab PT  Goals Patient Stated Goal: improve breathing, stop abdominal pain, and go to rehab PT Goal Formulation: With patient Time For Goal Achievement: 09/27/17 Potential to Achieve Goals: Good    Frequency Min 3X/week   Barriers to discharge Decreased caregiver support lives alone    Co-evaluation PT/OT/SLP Co-Evaluation/Treatment: Yes Reason for Co-Treatment: Complexity of the patient's impairments (multi-system involvement) PT goals addressed during session: Mobility/safety with mobility         AM-PAC PT "6 Clicks" Daily Activity  Outcome Measure Difficulty turning over in bed (including adjusting bedclothes, sheets and blankets)?: Unable Difficulty moving from lying on back to sitting on the side of the bed? : Unable Difficulty sitting down on and standing up from a chair with arms (e.g., wheelchair, bedside commode, etc,.)?: Unable Help needed moving to and from a bed to chair (including a wheelchair)?: A Lot Help needed walking in hospital room?: A Lot Help needed climbing 3-5 steps with a railing? : Total 6 Click Score: 8    End of Session Equipment Utilized During Treatment: Oxygen(4LO2 via [place]) Activity Tolerance: Other (comment)(limited by SpO2 into 70s and RR at 48) Patient left: in bed;with nursing/sitter in room;with bed alarm set Nurse Communication: Mobility status PT Visit Diagnosis: Unsteadiness on feet (R26.81);Difficulty in walking, not elsewhere classified (R26.2)    Time: 5366-4403 PT Time Calculation (min) (ACUTE ONLY): 37 min   Charges:   PT Evaluation $PT Eval Moderate Complexity: 1 Mod     PT G Codes:        Kittie Plater, PT, DPT Pager #: [phone] Office #: [phone]   Dinita Migliaccio M Joelle Flessner 09/20/2017, 10:45 AM

## 2017-09-20 NOTE — Procedures (Signed)
During PT pt became SOB with increased WOB, pt placed back on Bipap by RN. Pt tolerating well at this time, RT will monitor

## 2017-09-20 NOTE — Progress Notes (Signed)
Patient resting comfortably on 4L [place] maintaining spO2 94% with no respiratory distress noted. BIPAP is not needed at this time. RT will monitor as needed.

## 2017-09-20 NOTE — Progress Notes (Addendum)
[facility] Attending Note  ADMISSION DATE:09/16/2017  CONSULTATION DATE:09/16/2017  REFERRING MD:Dr. Marlou Starks / CCS   CHIEF COMPLAINT:Acute Hypercarbic Respiratory Failure   Presenting HPI:  71 y.o. female with ongoing tobacco use and known underlying severe COPD with pulmonary emphysema. Patient with diagnosis of invasive ductal carcinoma of the left breast was admitted yesterday for left mastectomy. Patient also underwent sentinel lymph node biopsy and dissection on the left. Postoperatively patient was extubated but had respiratory distress and acute hypercarbic respiratory failure requiring noninvasive positive pressure ventilation. Patient was subsequently transitioned to the ICU and PCCM was consulted for assistance in treatment.  Subjective:  Continuing to require intermittent BiPAP. Patient still somewhat somnolent today. Unable to cooperate with physical therapy. Patient reports continued dyspnea despite BiPAP. Denies any chest pain, pressure, or tightness. No significant cough.  Review of Systems:  No subjective fever or chills. No abdominal pain or nausea.  FiO2 (%):  [50 %] 50 %  Temp:  [98 F (36.7 C)-98.9 F (37.2 C)] 98.9 F (37.2 C) (11/23 0749) Pulse Rate:  [45-118] 94 (11/23 1046) Resp:  [18-38] 26 (11/23 1046) BP: (92-127)/(56-79) 127/74 (11/23 1046) SpO2:  [91 %-100 %] 100 % (11/23 1046) FiO2 (%):  [50 %] 50 % (11/22 1203) Weight:  [98 lb 12.3 oz (44.8 kg)] 98 lb 12.3 oz (44.8 kg) (11/23 0500)  Gen.: Patient sitting up in bed. Currently on BiPAP. No distress. Integument: Warm. Dry. Without rash. Pulmonary: Symmetrically decreased breath sounds. Normal work of breathing on BiPAP. Cardiovascular: Regular rate. Unable to appreciate JVD. Abdomen: Soft. Nontender. Nondistended. Neurological: Awake. Grossly nonfocal. Oriented to person, place, year, and president.  LINES/TUBES: OETT 11/21 - 11/21 (self-extubated) Left JP Drains 11/19  >>> PIV  CBC Latest Ref Rng & Units 09/20/2017 09/19/2017 09/18/2017  WBC 4.0 - 10.5 K/uL 4.7 6.0 8.0  Hemoglobin 12.0 - 15.0 g/dL 11.0(L) 10.7(L) 11.7(L)  Hematocrit 36.0 - 46.0 % 34.8(L) 34.0(L) 37.9  Platelets 150 - 400 K/uL 188 176 161   BMP Latest Ref Rng & Units 09/20/2017 09/19/2017 09/18/2017  Glucose 65 - 99 mg/dL 90 70 99  BUN 6 - 20 mg/dL 14 8 7   Creatinine 0.44 - 1.00 mg/dL 0.63 0.70 0.65  Sodium 135 - 145 mmol/L 140 136 135  Potassium 3.5 - 5.1 mmol/L 4.3 4.2 4.8  Chloride 101 - 111 mmol/L 100(L) 101 102  CO2 22 - 32 mmol/L 34(H) 29 24  Calcium 8.9 - 10.3 mg/dL 8.6(L) 8.0(L) 8.3(L)   Hepatic Function Latest Ref Rng & Units 09/20/2017 09/19/2017 05/31/2017  Total Protein 6.0 - 8.3 g/dL - - 7.4  Albumin 3.5 - 5.0 g/dL 3.1(L) 3.1(L) 4.4  AST 0 - 37 U/L - - 23  ALT 0 - 35 U/L - - 16  Alk Phosphatase 39 - 117 U/L - - 63  Total Bilirubin 0.2 - 1.2 mg/dL - - 1.2  Bilirubin, Direct 0.0 - 0.3 mg/dL - - -    IMAGING/STUDIES: PORT CXR 11/21:  Previously reviewed by me. Endotracheal tube in good position. No focal opacity or pleural effusion appreciated. Hyperinflation suggested. PORT CXR 11/22:  Previously reviewed by me. Endotracheal tube removed. Silhouetting of right hemidiaphragm is new. Suggesting atelectasis or evolving opacity/effusion. No other new focal opacity appreciated.   MICROBIOLOGY: MRSA PCR 11/19:  Negative   ANTIBIOTICS: Ancef (periop)  SIGNIFICANT EVENTS: 11/19 - Admit for mastectomy 11/21 - Readmit to ICU and intubation for worsening hypercarbia after Morphine 17m IV earlier in evening 11/23 -  Still requiring intermittent BiPAP for increased WOB. Unable to tolerate PT w/ dyspnea. Starting Duonebs q4hr scheduled & Decadron IV q12hr.  ASSESSMENT/PLAN:  71 y.o.  female with underlying severe COPD and ongoing tobacco use disorder. Status post left mastectomy. Self extubated on 11/21. Continues to have tenuous respiratory status.   1. Acute on chronic  hypercarbic respiratory failure: Avoiding narcotics completely. Continuing pulmonary toilet. Treatment of COPD as follows. Targeting saturation 88-94% to minimize VQ mismatching. 2. Severe COPD: No clear signs of exacerbation. Discontinuing Stiolto. Starting DuoNeb every 4 hours scheduled & Decadron 2 mg IV every 12 hours 4 doses. Continuing BiPAP as needed for increased work of breathing. Continuing albuterol nebulized as needed. 3. Postoperative pain: Continuing Tylenol only. Avoiding narcotics given increased sedation.  4. Tobacco use disorder: Continuing nicotine replacement with patches daily.  5. Essential hypertension: Currently normotensive. Holding home antihypertensive regimen. 6. Acute encephalopathy: Resolved. Secondary to hypercarbia. 7. Hypothyroidism: Continuing on Synthroid. 8. Invasive ductal carcinoma: Status post left mastectomy. Postoperative management per primary service.  Prophylaxis: Heparin 5000 units subcutaneous every 8 hours & SCDs. Diet:  Diet as tolerated. Code Status:  Full Code.  Disposition:  Awaiting SDU bed. PT consulted & following. Family Update:  Patient updated during rounds.  I have spent a total of 38 minutes of time today caring for the patient, reviewing the patient's electronic medical record, and with more than 50% of that time spent coordinating care with the patient as well as reviewing the continuing plan of care with the patient and nurse at bedside.  Remainder of care as per primary service. PCCM will continue to follow.   Sonia Baller Ashok Cordia, M.D. [facility] Pager:  [phone] After 7pm or if no response, call [phone] 10:52 AM 09/20/17

## 2017-09-20 NOTE — Progress Notes (Signed)
4 Days Post-Op  Subjective:  Remains off vent.  Mild respiratory distress.  SPO2 98%.  On nasal cannula No wound problems. JP drainage serosanguineous, slowing down Transferring to stepdown Case management consulted for SNF placement  Hemoglobin 11.0.  Stable.  Potassium 4.3. Discussed care plan with Dr. Ashok Cordia. Spoke with nursing staff and PT about range of motion of shoulder  Objective: Vital signs in last 24 hours: Temp:  [98 F (36.7 C)-98.9 F (37.2 C)] 98.9 F (37.2 C) (11/23 0749) Pulse Rate:  [45-118] 101 (11/23 0805) Resp:  [18-38] 25 (11/23 0805) BP: (92-115)/(56-79) 105/64 (11/23 0800) SpO2:  [90 %-100 %] 98 % (11/23 0830) FiO2 (%):  [50 %] 50 % (11/22 1203) Weight:  [44.8 kg (98 lb 12.3 oz)] 44.8 kg (98 lb 12.3 oz) (11/23 0500) Last BM Date: 09/13/17  Intake/Output from previous day: 11/22 0701 - 11/23 0700 In: 130.8 [I.V.:130.8] Out: 665 [Urine:600; Drains:65] Intake/Output this shift: No intake/output data recorded.   EXAM: General appearance: Alert.  Depressed affect.  Low BMI.  Does not appear to be in any respiratory distress Resp: Lungs seem clear with normal work of breathing. Chest wall: no tenderness, Left mastectomy wound looks very good.  Skin flaps healthy and viable.  No hematoma.  Drainage is serosanguineous.  No arm swelling. GI: soft, non-tender; bowel sounds normal; no masses,  no organomegaly     Lab Results:  Results for orders placed or performed during the hospital encounter of 09/16/17 (from the past 24 hour(s))  Glucose, capillary     Status: Abnormal   Collection Time: 09/19/17  8:13 PM  Result Value Ref Range   Glucose-Capillary 61 (L) 65 - 99 mg/dL   Comment 1 Notify RN   Glucose, capillary     Status: Abnormal   Collection Time: 09/19/17  8:41 PM  Result Value Ref Range   Glucose-Capillary 125 (H) 65 - 99 mg/dL   Comment 1 Capillary Specimen   Glucose, capillary     Status: None   Collection Time: 09/19/17 11:40 PM  Result  Value Ref Range   Glucose-Capillary 88 65 - 99 mg/dL   Comment 1 Capillary Specimen   Procalcitonin     Status: None   Collection Time: 09/20/17  2:24 AM  Result Value Ref Range   Procalcitonin <0.10 ng/mL  Magnesium     Status: None   Collection Time: 09/20/17  2:24 AM  Result Value Ref Range   Magnesium 1.9 1.7 - 2.4 mg/dL  CBC     Status: Abnormal   Collection Time: 09/20/17  2:24 AM  Result Value Ref Range   WBC 4.7 4.0 - 10.5 K/uL   RBC 3.56 (L) 3.87 - 5.11 MIL/uL   Hemoglobin 11.0 (L) 12.0 - 15.0 g/dL   HCT 34.8 (L) 36.0 - 46.0 %   MCV 97.8 78.0 - 100.0 fL   MCH 30.9 26.0 - 34.0 pg   MCHC 31.6 30.0 - 36.0 g/dL   RDW 14.0 11.5 - 15.5 %   Platelets 188 150 - 400 K/uL  Renal function panel     Status: Abnormal   Collection Time: 09/20/17  2:24 AM  Result Value Ref Range   Sodium 140 135 - 145 mmol/L   Potassium 4.3 3.5 - 5.1 mmol/L   Chloride 100 (L) 101 - 111 mmol/L   CO2 34 (H) 22 - 32 mmol/L   Glucose, Bld 90 65 - 99 mg/dL   BUN 14 6 - 20 mg/dL   Creatinine,  Ser 0.63 0.44 - 1.00 mg/dL   Calcium 8.6 (L) 8.9 - 10.3 mg/dL   Phosphorus 2.8 2.5 - 4.6 mg/dL   Albumin 3.1 (L) 3.5 - 5.0 g/dL   GFR calc non Af Amer >60 >60 mL/min   GFR calc Af Amer >60 >60 mL/min   Anion gap 6 5 - 15  Glucose, capillary     Status: None   Collection Time: 09/20/17  3:12 AM  Result Value Ref Range   Glucose-Capillary 84 65 - 99 mg/dL   Comment 1 Capillary Specimen      Studies/Results: No results found.  . heparin  5,000 Units Subcutaneous Q8H  . levothyroxine  88 mcg Oral QAC breakfast  . mouth rinse  15 mL Mouth Rinse BID  . nicotine  7 mg Transdermal Daily  . nystatin  5 mL Mouth/Throat QID  . Tiotropium Bromide-Olodaterol  2 puff Inhalation Daily     Assessment/Plan: s/p Procedure(s): LEFT MASTECTOMY WITH SENTINEL LYMPH NODE BIOPSY  POD #4.  Left mastectomy with sentinel lymph node biopsy. No surgical problems Diet as tol. Out of bed PT and OT consults  Postop  acute hypercarbic respiratory failure.  Patient has done reasonably well since self extubation yesterday Discussed with Dr. Ashok Cordia. 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  No support.  Will need SNF placement.  Case management consulted    @PROBHOSP @  LOS: 4 days    Kristin [name] 09/20/2017  . .prob

## 2017-09-21 LAB — URINALYSIS, ROUTINE W REFLEX MICROSCOPIC
Bilirubin Urine: NEGATIVE
GLUCOSE, UA: NEGATIVE mg/dL
KETONES UR: 20 mg/dL — AB
LEUKOCYTES UA: NEGATIVE
Nitrite: NEGATIVE
PH: 6 (ref 5.0–8.0)
Protein, ur: NEGATIVE mg/dL
Specific Gravity, Urine: 1.02 (ref 1.005–1.030)

## 2017-09-21 LAB — RENAL FUNCTION PANEL
ANION GAP: 11 (ref 5–15)
Albumin: 3.1 g/dL — ABNORMAL LOW (ref 3.5–5.0)
BUN: 15 mg/dL (ref 6–20)
CHLORIDE: 99 mmol/L — AB (ref 101–111)
CO2: 30 mmol/L (ref 22–32)
Calcium: 8.9 mg/dL (ref 8.9–10.3)
Creatinine, Ser: 0.66 mg/dL (ref 0.44–1.00)
GFR calc Af Amer: >60 mL/min (ref >60)
Glucose, Bld: 99 mg/dL (ref 65–99)
POTASSIUM: 4.5 mmol/L (ref 3.5–5.1)
Phosphorus: 2.8 mg/dL (ref 2.5–4.6)
Sodium: 140 mmol/L (ref 135–145)

## 2017-09-21 LAB — CBC
HEMATOCRIT: 36.5 % (ref 36.0–46.0)
HEMOGLOBIN: 11.7 g/dL — AB (ref 12.0–15.0)
MCH: 30.6 pg (ref 26.0–34.0)
MCHC: 32.1 g/dL (ref 30.0–36.0)
MCV: 95.5 fL (ref 78.0–100.0)
Platelets: 216 10*3/uL (ref 150–400)
RBC: 3.82 MIL/uL — ABNORMAL LOW (ref 3.87–5.11)
RDW: 13.5 % (ref 11.5–15.5)
WBC: 4.9 10*3/uL (ref 4.0–10.5)

## 2017-09-21 LAB — MAGNESIUM: Magnesium: 2 mg/dL (ref 1.7–2.4)

## 2017-09-21 MED ORDER — IPRATROPIUM-ALBUTEROL 0.5-2.5 (3) MG/3ML IN SOLN
3.0000 mL | Freq: Three times a day (TID) | RESPIRATORY_TRACT | Status: DC
  Administered 2017-09-21 – 2017-09-24 (×10): 3 mL via RESPIRATORY_TRACT
  Filled 2017-09-21 (×9): qty 3

## 2017-09-21 MED ORDER — LEVOTHYROXINE SODIUM 88 MCG PO TABS
88.0000 ug | ORAL_TABLET | Freq: Every day | ORAL | Status: DC
  Administered 2017-09-22 – 2017-09-24 (×3): 88 ug via ORAL
  Filled 2017-09-21 (×3): qty 1

## 2017-09-21 NOTE — Progress Notes (Signed)
[facility] Attending Note  ADMISSION DATE:09/16/2017  CONSULTATION DATE:09/16/2017  REFERRING MD:Dr. Marlou Starks / CCS   CHIEF COMPLAINT:Acute Hypercarbic Respiratory Failure   Presenting HPI:  71 y.o. female with ongoing tobacco use and known underlying severe COPD with pulmonary emphysema. Patient with diagnosis of invasive ductal carcinoma of the left breast was admitted yesterday for left mastectomy. Patient also underwent sentinel lymph node biopsy and dissection on the left. Postoperatively patient was extubated but had respiratory distress and acute hypercarbic respiratory failure requiring noninvasive positive pressure ventilation. Patient was subsequently transitioned to the ICU and PCCM was consulted for assistance in treatment.  Subjective: Not required noninvasive mechanical ventilatory support since yesterday in a.m.  Currently on nasal cannula without distress.   Review of Systems: Denies fevers chills or sweats.     Temp:  [97.4 F (36.3 C)-98.6 F (37 C)] 97.7 F (36.5 C) (11/24 0729) Pulse Rate:  [87-122] 106 (11/24 0729) Resp:  [18-42] 42 (11/24 0729) BP: (97-124)/(52-75) 122/75 (11/24 0729) SpO2:  [82 %-100 %] 96 % (11/24 0838) Weight:  [98 lb 1.7 oz (44.5 kg)] 98 lb 1.7 oz (44.5 kg) (11/24 0535)  General: Pleasant female in no acute distress at rest HEENT: No JVD is appreciated, negative for lymphadenopathy PSY: Appears appropriate today moves all extremities x4 Neuro: Intact CV: Heart sounds are regular regular rate and rhythm PULM: Decreased air movement, 95%saturations on FiO2 4 L nasal cannula SA:YTKZ, non-tender, bsx4 active  Extremities: warm/dry negative edema  Skin: no rashes or lesions   LINES/TUBES: OETT 11/21 - 11/21 (self-extubated) Left JP Drains 11/19 >>> PIV  CBC Latest Ref Rng & Units 09/21/2017 09/20/2017 09/19/2017  WBC 4.0 - 10.5 K/uL 4.9 4.7 6.0  Hemoglobin 12.0 - 15.0 g/dL 11.7(L) 11.0(L) 10.7(L)   Hematocrit 36.0 - 46.0 % 36.5 34.8(L) 34.0(L)  Platelets 150 - 400 K/uL 216 188 176   BMP Latest Ref Rng & Units 09/21/2017 09/20/2017 09/19/2017  Glucose 65 - 99 mg/dL 99 90 70  BUN 6 - 20 mg/dL _0 Creatinine 0.44 - 1.00 mg/dL 0.66 0.63 0.70  Sodium 135 - 145 mmol/L 140 140 136  Potassium 3.5 - 5.1 mmol/L 4.5 4.3 4.2  Chloride 101 - 111 mmol/L 99(L) 100(L) 101  CO2 22 - 32 mmol/L 30 34(H) 29  Calcium 8.9 - 10.3 mg/dL 8.9 8.6(L) 8.0(L)   Hepatic Function Latest Ref Rng & Units 09/21/2017 09/20/2017 09/19/2017  Total Protein 6.0 - 8.3 g/dL - - -  Albumin 3.5 - 5.0 g/dL 3.1(L) 3.1(L) 3.1(L)  AST 0 - 37 U/L - - -  ALT 0 - 35 U/L - - -  Alk Phosphatase 39 - 117 U/L - - -  Total Bilirubin 0.2 - 1.2 mg/dL - - -  Bilirubin, Direct 0.0 - 0.3 mg/dL - - -    IMAGING/STUDIES: PORT CXR 11/21:  Previously reviewed by me. Endotracheal tube in good position. No focal opacity or pleural effusion appreciated. Hyperinflation suggested. PORT CXR 11/22:  Previously reviewed by me. Endotracheal tube removed. Silhouetting of right hemidiaphragm is new. Suggesting atelectasis or evolving opacity/effusion. No other new focal opacity appreciated.   MICROBIOLOGY: MRSA PCR 11/19:  Negative   ANTIBIOTICS: Ancef (periop)  SIGNIFICANT EVENTS: 11/19 - Admit for mastectomy 11/21 - Readmit to ICU and intubation for worsening hypercarbia after Morphine 37m IV earlier in evening 11/23 - Still requiring intermittent BiPAP for increased WOB. Unable to tolerate PT w/ dyspnea. Starting Duonebs q4hr scheduled & Decadron IV q12hr.  09/21/2017 she is currently on stepdown unit bed not requiring BiPAP at this time.  Reports being comfortable  ASSESSMENT/PLAN:  71 y.o.  female with underlying severe COPD and ongoing tobacco use disorder. Status post left mastectomy. Self extubated on 11/21.  Status post left mastectomy for invasive ductal carcinoma per [facility].  Per surgery  Status post  hypercarbic respiratory failure in the setting of severe COPD and sensitivity to narcotics.  We will continue to hold narcotics.  Utilize nonnarcotic ways of pain relief. Continue bronchodilators PRN noninvasive mechanical ventilatory support She may want to consider stop smoking.  Hypertension currently controlled.  Altered mental status.  Currently awake alert in no acute distress  P CCM will follow-up again on 09/23/2017.  Prophylaxis: Heparin 5000 units subcutaneous every 8 hours & SCDs. Diet:  Diet as tolerated. Code Status:  Full Code.  Disposition: Currently in  SDU bed. PT consulted & following. Family Update:  Patient updated during rounds.   Remainder of care as per primary service. PCCM will continue to follow.   Richardson Landry Kienna Moncada ACNP Maryanna Shape PCCM Pager [phone] till 1 pm If no answer page [phone] 09/21/2017, 11:52 AM

## 2017-09-21 NOTE — Progress Notes (Signed)
  Speech Language Pathology Treatment: Dysphagia  Patient Details Name: Kristin [name] MRN: [id number] DOB: Aug 22, 1946 Today's Date: 09/21/2017 Time: 9211-9417 SLP Time Calculation (min) (ACUTE ONLY): 25 min  Assessment / Plan / Recommendation Clinical Impression  Patient seen for po readiness. Alert and cooperative. Moderate SOB with RR fluctuating between mid 20s to high 40s at baseline as well as during po intake. Vocal quality WFL without notable change per notes s/p brief intubation. H/o esophageal dysphagia noted with patient reporting she consumes primarily pureed solids prior to admission. Skilled observation with pureed solids and thin liquids revealed what appeared to be intact airway protection with no overt indication of aspiration, prolonged manipulation/mastication of pureed solids. SLP providing min cueing initially for frequent breaks to control increased SOB with activity. By end of session patient with independent use of breaks with self feeding as needed. Additionally patient independent with small bites and sips. Although aspiration risk remains moderate given respiratory insufficiency increasing risk for preswallow aspiration, recommend initiation of po diet with strict use of precautions. RN made aware. SLP will f/u.    HPI HPI: Pt is a 71 year old female with end stage COPD and recent diagnosis breast Ca admitted 11/19 for L mastectomy. Post-procedure she was extubated but had respiratory distress and was placed on BiPAP. PMH also includes pre-diabetes, PNA, motor neuron disease, esophageal reflux, dyspnea, and dysphagia over the last 10 years per pt report. MBS completed in May 2015 showed a functional oropharyngeal swallow, although testing was limited as pt would not consume straw sips, larger boluses of purees, or any more solid textures. Pt also says that she had EGD and gastric emptying study that were also [facility].      SLP Plan  Continue with current plan of care        Recommendations  Diet recommendations: Dysphagia 1 (puree);Thin liquid Liquids provided via: Cup;Straw Medication Administration: Whole meds with puree Supervision: Patient able to self feed;Full supervision/cueing for compensatory strategies Compensations: Slow rate;Small sips/bites;Follow solids with liquid(frequent breaks to control SOB) Postural Changes and/or Swallow Maneuvers: Seated upright 90 degrees;Upright 30-60 min after meal                Oral Care Recommendations: Oral care BID Follow up Recommendations: None SLP Visit Diagnosis: Dysphagia, unspecified (R13.10) Plan: Continue with current plan of care       [facility] [place], [place] [phone]                 Kristin [name] 09/21/2017, 8:51 AM

## 2017-09-21 NOTE — Progress Notes (Signed)
5 Days Post-Op  Subjective: Stable off of ventilator.  SPO2 96%. Complains of chronic stomach problems.  Alternating diarrhea and constipation.  Followed by Kristin [name]  Hemoglobin 11.7.  Stable.  Potassium 4.5.  Creatinine 0.66.  Glucose 99.  No problems with left mastectomy wound.  Drainage serosanguineous.  PT working with her regarding left shoulder range of motion. Social work and case management consulted for SNF placement.  Objective: Vital signs in last 24 hours: Temp:  [97.4 F (36.3 C)-98.6 F (37 C)] 97.7 F (36.5 C) (11/24 0729) Pulse Rate:  [87-122] 95 (11/24 0415) Resp:  [18-37] 28 (11/24 0415) BP: (97-127)/(52-74) 114/72 (11/24 0415) SpO2:  [82 %-100 %] 96 % (11/24 0729) FiO2 (%):  [40 %] 40 % (11/23 1137) Weight:  [44.5 kg (98 lb 1.7 oz)] 44.5 kg (98 lb 1.7 oz) (11/24 0535) Last BM Date: 09/13/17  Intake/Output from previous day: 11/23 0701 - 11/24 0700 In: -  Out: 368 [Urine:320; Drains:48] Intake/Output this shift: No intake/output data recorded.  EXAM: General appearance:Alert. Depressed affect. Low BMI. Does not appear to be in any respiratory distress.  Carries on good conversation.  Skin warm and dry. Resp:Lungs seem clear with normal work of breathing. Chest wall:no tenderness,Left mastectomy wound looks very good. Skin flaps healthy and viable. No hematoma. Drainage is serosanguineous. No arm swelling. ZO:XWRU, non-tender; bowel sounds normal; no masses, no organomegaly      Lab Results:  Results for orders placed or performed during the hospital encounter of 09/16/17 (from the past 24 hour(s))  Magnesium     Status: None   Collection Time: 09/21/17  2:33 AM  Result Value Ref Range   Magnesium 2.0 1.7 - 2.4 mg/dL  CBC     Status: Abnormal   Collection Time: 09/21/17  2:33 AM  Result Value Ref Range   WBC 4.9 4.0 - 10.5 K/uL   RBC 3.82 (L) 3.87 - 5.11 MIL/uL   Hemoglobin 11.7 (L) 12.0 - 15.0 g/dL   HCT 36.5 36.0 - 46.0 %   MCV  95.5 78.0 - 100.0 fL   MCH 30.6 26.0 - 34.0 pg   MCHC 32.1 30.0 - 36.0 g/dL   RDW 13.5 11.5 - 15.5 %   Platelets 216 150 - 400 K/uL  Renal function panel     Status: Abnormal   Collection Time: 09/21/17  2:33 AM  Result Value Ref Range   Sodium 140 135 - 145 mmol/L   Potassium 4.5 3.5 - 5.1 mmol/L   Chloride 99 (L) 101 - 111 mmol/L   CO2 30 22 - 32 mmol/L   Glucose, Bld 99 65 - 99 mg/dL   BUN 15 6 - 20 mg/dL   Creatinine, Ser 0.66 0.44 - 1.00 mg/dL   Calcium 8.9 8.9 - 10.3 mg/dL   Phosphorus 2.8 2.5 - 4.6 mg/dL   Albumin 3.1 (L) 3.5 - 5.0 g/dL   GFR calc non Af Amer >60 >60 mL/min   GFR calc Af Amer >60 >60 mL/min   Anion gap 11 5 - 15     Studies/Results: No results found.  Marland Kitchen dexamethasone  2 mg Intravenous Q12H  . heparin  5,000 Units Subcutaneous Q8H  . ipratropium-albuterol  3 mL Nebulization Q4H  . levothyroxine  44 mcg Intravenous Daily  . mouth rinse  15 mL Mouth Rinse BID  . nicotine  7 mg Transdermal Daily  . nystatin  5 mL Mouth/Throat QID     Assessment/Plan: s/p Procedure(s): LEFT MASTECTOMY WITH SENTINEL  LYMPH NODE BIOPSY  POD #5. Left mastectomy with sentinel lymph node biopsy. No surgical problems Diet - soft.  He Out of bed PT and OT consults  Postop acute hypercarbic respiratory failure.Patient has done reasonably well since self extubation on 11/22.  Discussed with Dr. Ashok Cordia. 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  No support.  Will need SNF placement.  Case management/SW  consulted     @PROBHOSP @  LOS: 5 days    Kristin [name] 09/21/2017  . .prob

## 2017-09-22 NOTE — Progress Notes (Signed)
6 Days Post-Op  Subjective: Stable and alert. Could not void and Foley catheter reinserted. No wound or drain problems. No increased work of breathing.  SPO2 99% on nasal cannula. Appreciate pulmonary medicine advice and fine tuning of her chronic hypercarbic COPD  Consults to social work and case management have been put in, but she has not been seen over the holiday weekend yet.  She will need SNF.  Objective: Vital signs in last 24 hours: Temp:  [98 F (36.7 C)-98.3 F (36.8 C)] 98.1 F (36.7 C) (11/25 0347) Pulse Rate:  [85-109] 86 (11/25 0734) Resp:  [15-33] 18 (11/25 0734) BP: (104-111)/(58-66) 111/66 (11/25 0734) SpO2:  [93 %-100 %] 99 % (11/25 0734) Weight:  [43.2 kg (95 lb 3.8 oz)] 43.2 kg (95 lb 3.8 oz) (11/25 0347) Last BM Date: 09/13/17  Intake/Output from previous day: 11/24 0701 - 11/25 0700 In: 3 [P.O.:820] Out: 710 [Urine:250; Drains:460] Intake/Output this shift: Total I/O In: -  Out: 100 [Urine:100]    EXAM: General appearance:Alert. Depressed affect. More talkative today.Low BMI. Does not appear to be in any respiratory distress.  Carries on good conversation.  Skin warm and dry. Resp:Lungs seem clear with normal work of breathing. Chest wall:no tenderness,Left mastectomy wound looks very good. Skin flaps healthy and viable. No hematoma. Drainage is serosanguineous. No arm swelling. Extremities: Left shoulder range of motion somewhat limited.  She is stiff.  I can get her out to 90.  No arm swelling. Physical therapy involved. MW:NUUV, non-tender; bowel sounds normal; no masses, no organomegaly     Lab Results:  No results found for this or any previous visit (from the past 24 hour(s)).   Studies/Results: No results found.  . heparin  5,000 Units Subcutaneous Q8H  . ipratropium-albuterol  3 mL Nebulization TID  . levothyroxine  88 mcg Oral QAC breakfast  . mouth rinse  15 mL Mouth Rinse BID  . nicotine  7 mg Transdermal Daily   . nystatin  5 mL Mouth/Throat QID     Assessment/Plan: s/p Procedure(s): LEFT MASTECTOMY WITH SENTINEL LYMPH NODE BIOPSY    POD #6. Left mastectomy with sentinel lymph node biopsy. No surgical problems Diet- soft.   Out of bed PT and OT consults  Urinary retention: Foley back in  Postop acute hypercarbic respiratory failure.Patient has done reasonably well since self extubation on 11/22.  Discussed with Dr. Ashok Cordia. He will advise when she meets discharge criteria from pulmonary standpoint  Tobacco abuse Severe COPD with pulmonary emphysema Chronic hypercarbia  Disposition:She lives alone. No support. Will need SNF placement. Case management/SW  consulted   @PROBHOSP @  LOS: 6 days    [name] 09/22/2017  . .prob

## 2017-09-22 NOTE — Progress Notes (Signed)
  Speech Language Pathology Treatment: Dysphagia  Patient Details Name: Kristin [name] MRN: [id number] DOB: 22-Dec-1945 Today's Date: 09/22/2017 Time: 0932-6712 SLP Time Calculation (min) (ACUTE ONLY): 21 min  Assessment / Plan / Recommendation Clinical Impression  Pt's diet is currently soft per orders; per previous swallow treatment session pt reported she eats puree at home. Discussed the difference and pt preferred to return to puree (D1). Observation with thin and puree were overall functional with mildly increased work of breathing and RR remaining stable. She states she typically never eats after 2:00 pm because due to decreased appetite. Reiterated importance of rest breaks due to COPD. Will check on again briefly.     HPI HPI: Pt is a 71 year old female with end stage COPD and recent diagnosis breast Ca admitted 11/19 for L mastectomy. Post-procedure she was extubated but had respiratory distress and was placed on BiPAP. PMH also includes pre-diabetes, PNA, motor neuron disease, esophageal reflux, dyspnea, and dysphagia over the last 10 years per pt report. MBS completed in May 2015 showed a functional oropharyngeal swallow, although testing was limited as pt would not consume straw sips, larger boluses of purees, or any more solid textures. Pt also says that she had EGD and gastric emptying study that were also [facility].      SLP Plan  Continue with current plan of care       Recommendations  Diet recommendations: Dysphagia 1 (puree);Thin liquid Liquids provided via: Cup Medication Administration: Whole meds with puree Supervision: Patient able to self feed;Intermittent supervision to cue for compensatory strategies Compensations: Slow rate;Small sips/bites;Follow solids with liquid Postural Changes and/or Swallow Maneuvers: Seated upright 90 degrees;Upright 30-60 min after meal                Oral Care Recommendations: Oral care BID Follow up Recommendations: None SLP Visit  Diagnosis: Dysphagia, unspecified (R13.10) Plan: Continue with current plan of care       GO                Houston Siren 09/22/2017, 11:53 AM  Orbie Pyo Colvin Caroli.Ed [employer] [phone]

## 2017-09-23 DIAGNOSIS — R0902 Hypoxemia: Secondary | ICD-10-CM

## 2017-09-23 DIAGNOSIS — J441 Chronic obstructive pulmonary disease with (acute) exacerbation: Secondary | ICD-10-CM

## 2017-09-23 DIAGNOSIS — Z720 Tobacco use: Secondary | ICD-10-CM

## 2017-09-23 LAB — URINE CULTURE

## 2017-09-23 MED ORDER — DOCUSATE SODIUM 50 MG PO CAPS
50.0000 mg | ORAL_CAPSULE | Freq: Every day | ORAL | Status: DC
  Filled 2017-09-23: qty 1

## 2017-09-23 MED ORDER — ACETAMINOPHEN 325 MG PO TABS
650.0000 mg | ORAL_TABLET | Freq: Four times a day (QID) | ORAL | Status: DC | PRN
  Administered 2017-09-23: 650 mg via ORAL
  Filled 2017-09-23: qty 2

## 2017-09-23 MED ORDER — BOOST / RESOURCE BREEZE PO LIQD
1.0000 | Freq: Three times a day (TID) | ORAL | Status: DC
Start: 2017-09-23 — End: 2017-09-24
  Administered 2017-09-23 – 2017-09-24 (×3): 1 via ORAL

## 2017-09-23 MED ORDER — CIPROFLOXACIN HCL 500 MG PO TABS: 500.0000 mg | ORAL_TABLET | Freq: Two times a day (BID) | ORAL | Status: DC

## 2017-09-23 MED ORDER — POLYETHYLENE GLYCOL 3350 17 G PO PACK
17.0000 g | PACK | Freq: Two times a day (BID) | ORAL | Status: DC | PRN
  Administered 2017-09-23: 17 g via ORAL
  Filled 2017-09-23: qty 1

## 2017-09-23 MED ORDER — CIPROFLOXACIN HCL 500 MG PO TABS
500.0000 mg | ORAL_TABLET | Freq: Two times a day (BID) | ORAL | Status: AC
  Administered 2017-09-24 (×2): 500 mg via ORAL
  Filled 2017-09-23 (×2): qty 1

## 2017-09-23 MED ORDER — DOCUSATE SODIUM 50 MG/5ML PO LIQD
50.0000 mg | Freq: Every day | ORAL | Status: DC
  Administered 2017-09-23 – 2017-09-24 (×2): 50 mg via ORAL
  Filled 2017-09-23 (×2): qty 10

## 2017-09-23 NOTE — Care Management Note (Signed)
Case Management Note  Patient Details  Name: Kristin [name] MRN: [id number] Date of Birth: 02/02/1946  Subjective/Objective:    Pt is s/p mastectomy      Action/Plan:  PTA pt was completely independent and lived alone.  CM informed by CSW that pt is now interested in going home with [facility].  CM discussed with the pt the SNF recommendations and the concern with pt discharging home with current mobility capabilities (pt can only tolerate EOB).  Pt informed CM that she does not have 24 hour help nor supervision for the home and that she also has concerns with returning home in her current condition.  Pt requested CM discuss early am PT treatments with therapy as this is when she has the most energy (pt informed CM that it was out of frustration with PT assessment today that she stated she wanted to return home).  CM contacted therapy and communicated concerns.  CM also informed CSW that pt is agreeable to SNF for discharge   Expected Discharge Date:                  Expected Discharge Plan:  [place]  In-House Referral:  Clinical Social Work  Discharge planning Services  CM Consult  Post Acute Care Choice:    Choice offered to:     DME Arranged:    DME Agency:     HH Arranged:    [place] Agency:     Status of Service:  In process, will continue to follow  If discussed at Long Length of Stay Meetings, dates discussed:    Additional Comments: Pt remains on [place] - plan is titrate oxygen down and increase oral intake prior to SNF - currently pt is only eating 0-25%.   Maryclare Labrador, RN 09/23/2017, 2:14 PM

## 2017-09-23 NOTE — Progress Notes (Signed)
Nutrition Follow-up  DOCUMENTATION CODES:   Severe malnutrition in context of chronic illness, Underweight  INTERVENTION:   -Boost Breeze po TID, each supplement provides 250 kcal and 9 grams of protein  NUTRITION DIAGNOSIS:   Severe Malnutrition related to chronic illness(COPD, dysphagia) as evidenced by severe muscle depletion, severe fat depletion, percent weight loss(22.5% weight loss within 1 year).  Ongoing  GOAL:   Patient will meet greater than or equal to 90% of their needs  Progressing  MONITOR:   Diet advancement, PO intake, Supplement acceptance  REASON FOR ASSESSMENT:   Consult(Verbal consult from RN ) Assessment of nutrition requirement/status  ASSESSMENT:   71 yo female with PMH of vitamin D deficiency, GERD, COPD, chronic obstructive bronchitis, hypothyroidism, HTN, HLD, diverticulosis, IBS, prediabetes, motor neuron DZ, breast cancer who was admitted on 11/19 S/P mastectomy for invasive ductal carcinoma of the breast.  11/20- transferred to SDU 11/21- intubated, transferred to ICU, self-extubated 11/22- hypoglycemic event 11/23- transferred to SDU 11/24- s/p BSE- advanced to dysphagia 1 diet with thin liquids  Pt working with therapies at time of visit; spoke with supervising therapist outside the room, who reports pt is making minimal progress with therapy assessment.   Pt with poor oral intake; noted meal completion 0-25%. RD had Boost Breeze supplements ordered previously. Pt does not tolerate milky-type supplements well historically, has lactose intolerance, and does not tolerate ensure.   CSW consulted for SNF placement.   Labs reviewed.   Diet Order:  DIET - DYS 1 Room service appropriate? Yes; Fluid consistency: Thin  EDUCATION NEEDS:   No education needs have been identified at this time  Skin:  Skin Assessment: Skin Integrity Issues: Skin Integrity Issues:: Incisions Incisions: closed lt breast  Last BM:  09/13/17  Height:   Ht  Readings from Last 1 Encounters:  09/17/17 5\' 6"  (1.676 m)    Weight:   Wt Readings from Last 1 Encounters:  09/23/17 104 lb 8 oz (47.4 kg)    Ideal Body Weight:  59.1 kg  BMI:  Body mass index is 16.87 kg/m.  Estimated Nutritional Needs:   Kcal:  1500-1700  Protein:  70-85 gm  Fluid:  1.5-1.7 L    Audray Rumore A. Jimmye Norman, RD, LDN, CDE Pager: [phone] After hours Pager: [phone]

## 2017-09-23 NOTE — Progress Notes (Signed)
Occupational Therapy Treatment Patient Details Name: Kristin [name] MRN: [id number] DOB: [birth year] Today's Date: 09/23/2017    History of present illness 71 y.o. female with breast cancer, ongoing tobacco use, and known underlying severe COPD with pulmonary emphysema. Patient admitted yesterday for left mastectomy with sentinel lymph node biopsy. Postoperatively patient was extubated but had respiratory distress and acute hypercarbic respiratory failure, AMS,  requiring noninvasive positive pressure ventilation. Pt self-extubated on 11/21.   OT comments  Pt demonstrating some progress toward OT goals. She did; however decline OOB mobility or ADL seated at EOB this session stating "this is my bedtime" because she wakes up very early in the mornings. Initially pt reporting that she will not D/C to SNF because of experiences this am with PT session but with further education, pt excited about the possibility of rehab before home. Notified RN and case management. Pt educated concerning HEP for L UE shoulder, elbow, wrist, and hand within parameters listed below. Will continue to follow while admitted. Continue to recommend SNF level rehabilitation and plan to progress with mobility next am.    Follow Up Recommendations  SNF    Equipment Recommendations  3 in 1 bedside commode    Recommendations for Other Services      Precautions / Restrictions Precautions Precautions: Other (comment) Precaution Comments: watch O2 sats, limited L shoulder flex/abd to 90 deg Restrictions Weight Bearing Restrictions: No       Mobility Bed Mobility Overal bed mobility: Needs Assistance Bed Mobility: Supine to Sit     Supine to sit: Min assist;HOB elevated Sit to supine: Min assist   General bed mobility comments: Pt required min assist to sit and advance to edge of bed but refused to advance mobility further as she reports feeling hot flashes and being rushed.    Transfers                  General transfer comment: Pt declined participation in transfers, reports she would feel better doing this in the morning despite PT treatment performed during am.       Balance     Sitting balance-Leahy Scale: Fair Sitting balance - Comments: pt able to perform sitting balance EOB ~20 min single extremity support (Reaching activites with BUE following flex/abd precautions on LUE)       Standing balance comment: declined to participate                           ADL either performed or assessed with clinical judgement   ADL Overall ADL's : Needs assistance/impaired Eating/Feeding: Set up;Sitting   Grooming: Wash/dry face;Set up;Bed level                                 General ADL Comments: Pt declines participation in OOB mobility or ADL at EOB as she reports this is her bedtime. Very frustrated with PT session this am as she felt rushed. Initially reporting that she refuses to D/C to SNF although she does acknowledge significant weakness. However, after discussion and education concerning SNF pt is agreeable to this. Educated on HEP for L UE with precautions (no forward flexion or abduction >90 degrees). Need to progress with ADL education and improve independence if able to go home.       Vision       Quarry manager  Arousal/Alertness: Awake/alert Behavior During Therapy: WFL for tasks assessed/performed Overall Cognitive Status: Within Functional Limits for tasks assessed                                 General Comments: agitated with SPTA as she reports feeling "rushed"         Exercises Exercises: Other exercises Other Exercises Other Exercises: Facilitated 10 repetitions of elbow/wrist/hand AROM exercises for edema management.  Other Exercises: Facilitated L shoulder forward flexion and abduction 0-90 degrees only for 10 repetitions.    Shoulder Instructions       General Comments      Pertinent  Vitals/ Pain       Pain Assessment: 0-10 Pain Score: 6  Pain Location: abdomen and incision Pain Descriptors / Indicators: Discomfort;Sore Pain Intervention(s): Monitored during session  Home Living                                          Prior Functioning/Environment              Frequency  Min 2X/week        Progress Toward Goals  OT Goals(current goals can now be found in the care plan section)  Progress towards OT goals: Progressing toward goals  Acute Rehab OT Goals Patient Stated Goal: to go home OT Goal Formulation: With patient Time For Goal Achievement: 10/04/17 Potential to Achieve Goals: Good  Plan Discharge plan remains appropriate    Co-evaluation                 AM-PAC PT "6 Clicks" Daily Activity     Outcome Measure   Help from another person eating meals?: None Help from another person taking care of personal grooming?: A Little Help from another person toileting, which includes using toliet, bedpan, or urinal?: A Lot Help from another person bathing (including washing, rinsing, drying)?: A Lot Help from another person to put on and taking off regular upper body clothing?: A Lot Help from another person to put on and taking off regular lower body clothing?: A Lot 6 Click Score: 15    End of Session Equipment Utilized During Treatment: Oxygen(2L)  OT Visit Diagnosis: Muscle weakness (generalized) (M62.81);Adult, failure to thrive (R62.7);Pain Pain - Right/Left: Left Pain - part of body: Shoulder   Activity Tolerance Patient tolerated treatment well   Patient Left in bed;with call bell/phone within reach;with bed alarm set   Nurse Communication Mobility status        Time: 1545-1630 OT Time Calculation (min): 45 min  Charges: OT General Charges $OT Visit: 1 Visit OT Treatments $Self Care/Home Management : 8-22 mins $Therapeutic Activity: 8-22 mins $Therapeutic Exercise: 8-22 mins  Norman Herrlich, MS  OTR/L  Pager: [place] A Aeon Koors 09/23/2017, 4:58 PM

## 2017-09-23 NOTE — Progress Notes (Addendum)
[facility] [facility] Attending Note  ADMISSION DATE:09/16/2017  CONSULTATION DATE:09/16/2017  REFERRING MD:Dr. Marlou Starks / CCS   CHIEF COMPLAINT:Acute Hypercarbic Respiratory Failure   Presenting HPI:  71 y.o. female with ongoing tobacco use and known underlying severe COPD with pulmonary emphysema. Patient with diagnosis of invasive ductal carcinoma of the left breast was admitted yesterday for left mastectomy. Patient also underwent sentinel lymph node biopsy and dissection on the left. Postoperatively patient was extubated but had respiratory distress and acute hypercarbic respiratory failure requiring noninvasive positive pressure ventilation. Patient was subsequently transitioned to the ICU and PCCM was consulted for assistance in treatment.  Subjective: Doing well on nasal cannula O2 although she is not on this at home. Describes hot flashes with associated SOB. Also some epigastric pain intermittently. Notably worse after eating.    FiO2 (%):  [28 %] 28 %  Temp:  [97.8 F (36.6 C)-98.6 F (37 C)] 98.4 F (36.9 C) (11/26 1129) Pulse Rate:  [87-104] 88 (11/26 0750) Resp:  [19-26] 19 (11/26 0750) BP: (97-115)/(61-69) 105/69 (11/26 0750) SpO2:  [95 %-100 %] 95 % (11/26 1339) FiO2 (%):  [28 %] 28 % (11/26 1339) Weight:  [47.4 kg (104 lb 8 oz)] 47.4 kg (104 lb 8 oz) (11/26 0346)  General:  Frail elderly female in NAD Neuro:  Alert, oriented, on-focal HEENT:  [place]/AT, No JVD noted, PERRL Cardiovascular:  RRR, no MRG Lungs:  Clear bilateral breath sounds. Poor air entry Abdomen:  Soft, non-distended, non-tender Musculoskeletal:  No acute deformity or ROM limitation Skin:  Intact, MMM  LINES/TUBES: OETT 11/21 - 11/21 (self-extubated)  CBC Latest Ref Rng & Units 09/21/2017 09/20/2017 09/19/2017  WBC 4.0 - 10.5 K/uL 4.9 4.7 6.0  Hemoglobin 12.0 - 15.0 g/dL 11.7(L) 11.0(L) 10.7(L)  Hematocrit 36.0 - 46.0 % 36.5 34.8(L) 34.0(L)  Platelets 150 - 400 K/uL 216 188  176   BMP Latest Ref Rng & Units 09/21/2017 09/20/2017 09/19/2017  Glucose 65 - 99 mg/dL 99 90 70  BUN 6 - 20 mg/dL _0 Creatinine 0.44 - 1.00 mg/dL 0.66 0.63 0.70  Sodium 135 - 145 mmol/L 140 140 136  Potassium 3.5 - 5.1 mmol/L 4.5 4.3 4.2  Chloride 101 - 111 mmol/L 99(L) 100(L) 101  CO2 22 - 32 mmol/L 30 34(H) 29  Calcium 8.9 - 10.3 mg/dL 8.9 8.6(L) 8.0(L)   Hepatic Function Latest Ref Rng & Units 09/21/2017 09/20/2017 09/19/2017  Total Protein 6.0 - 8.3 g/dL - - -  Albumin 3.5 - 5.0 g/dL 3.1(L) 3.1(L) 3.1(L)  AST 0 - 37 U/L - - -  ALT 0 - 35 U/L - - -  Alk Phosphatase 39 - 117 U/L - - -  Total Bilirubin 0.2 - 1.2 mg/dL - - -  Bilirubin, Direct 0.0 - 0.3 mg/dL - - -    IMAGING/STUDIES: PORT CXR 11/21:  Previously reviewed by me. Endotracheal tube in good position. No focal opacity or pleural effusion appreciated. Hyperinflation suggested. PORT CXR 11/22:  Previously reviewed by me. Endotracheal tube removed. Silhouetting of right hemidiaphragm is new. Suggesting atelectasis or evolving opacity/effusion. No other new focal opacity appreciated.   MICROBIOLOGY: MRSA PCR 11/19:  Negative   ANTIBIOTICS: Ancef (periop)  SIGNIFICANT EVENTS: 11/19 - Admit for mastectomy 11/21 - Readmit to ICU and intubation for worsening hypercarbia after Morphine 54m IV earlier in evening 11/23 - Still requiring intermittent BiPAP for increased WOB. Unable to tolerate PT w/ dyspnea. Starting Duonebs q4hr scheduled & Decadron IV q12hr. 09/21/2017  she is currently on stepdown unit bed not requiring BiPAP at this time.  Reports being comfortable  ASSESSMENT/PLAN:  71 y.o.  female with underlying severe COPD and ongoing tobacco use disorder. Status post left mastectomy. Self extubated on 11/21 and has been improving on [place] O2.   Status post left mastectomy for invasive ductal carcinoma per [facility].   - Per surgery  Acute on chronic hypercarbic respiratory failure in the  setting of severe COPD and sensitivity to narcotics.   - Holding narcotic medicaitons - Continue Duoneb TID - DC BiPAP from room.  - Supplemental O2 to keep SpO2 88-95% - Smoking cessation counseling ongoing.   Hypertension currently controlled.  Altered mental status (resolved)  She is stable for discharge to SNF from pulmonary standpoint.   Georgann Housekeeper, AGACNP-BC [place] [facility] Pager [phone] or [phone]  09/23/2017 3:53 PM  Attending Note:  71 year old female with PMH of COPD and undergoing tobacco abuse who had a mastectomy for invasive ductal carcinoma and was intubated and extubated 2 times who is now extubated and is in the stepdown unit on 2L.  Patient is very deconditioned and her lungs are very quite on exam.  I reviewed CXR myself, hyperinflation noted.  Discussed with PCCM-NP  COPD:  - Bronchodilators as ordered  - Albuterol PRN  - F/U with pulmonary as outpatient  Hypoxemia: anticipate transietn  - Titrate O2 for sat of 88-92%  - Anticipate will need to go to SNF with o2  Tobacco abuse:  - Smoking cessation  PCCM will sign off, please call back if needed.  Patient seen and examined, agree with above note.  I dictated the care and orders written for this patient under my direction.  Rush Farmer, [place]

## 2017-09-23 NOTE — Progress Notes (Addendum)
Tx performed and charted by SPTA under direct guidance and supervision of PTA at all times.  Charting reviewed for accuracy and depicts tx performed and services provided.  Governor Rooks, PTA pager [phone]    Physical Therapy Treatment Patient Details Name: Kristin [name] MRN: [id number] DOB: 05/30/1946 Today's Date: 09/23/2017    History of Present Illness 71 y.o. female with breast cancer, ongoing tobacco use, and known underlying severe COPD with pulmonary emphysema. Patient admitted yesterday for left mastectomy with sentinel lymph node biopsy. Postoperatively patient was extubated but had respiratory distress and acute hypercarbic respiratory failure, AMS,  requiring noninvasive positive pressure ventilation. Pt self-extubated on 11/21.    PT Comments    Pt stated that she would prefer to have therapy earlier in the AM. Pt willing to participate in today's session with frequent encouragement required due to pt's fear,symptoms of SOB and hot flashes. BP at beginning of session supine133/44mmHg. Min Assist for LE management and Trunk elevation to EOB. Pt required long rest break once EOB. Continued symptoms of SOB even with RR 20-23bpm and O2 at >90% on 4L/min [place]. Pt stated "you just don't understand I can't do what you ask because I can't breathe." Pt continued to sit EOB for ~15 min not willing to participate. BP at end of session seated EOB 133/28mmHg. Pt left EOB with nursing due to pt refusing assist back in bed from SPTA. Continue to recommend SNF at this time due to lack of participation and need for assistance with limited mobility.   Follow Up Recommendations  SNF;Supervision/Assistance - 24 hour     Equipment Recommendations  None recommended by PT    Recommendations for Other Services       Precautions / Restrictions Precautions Precautions: Other (comment) Precaution Comments: watch O2 sats, limited L shoulder flex/abd to 90 deg Restrictions Weight Bearing  Restrictions: No    Mobility  Bed Mobility Overal bed mobility: Needs Assistance Bed Mobility: Supine to Sit     Supine to sit: Min assist;HOB elevated Sit to supine: Min assist   Bed mobility comments: increased time due to + SOB, assist for trunk elevation to EOB and LE management. Pt able to scoot to EOB to place feet on floor. VC for hand placement  Transfers                 General transfer comment: pt unable due to reported symptoms of SOB RR 20-23bpm HR 104-115bpm O2 4L/min [place] >90% during bed mobility and while sitting EOB. Reported symptoms of hotflashes Ambulation/Gait                 Stairs            Wheelchair Mobility    Modified Rankin (Stroke Patients Only)       Balance     Sitting balance-Leahy Scale: Fair Sitting balance - Comments: pt able to perform sitting balance EOB ~20 min single extremity support (Reaching activites with BUE following flex/abd precautions on LUE)       Standing balance comment: declined to participate                            Cognition Arousal/Alertness: Awake/alert Behavior During Therapy: WFL for tasks assessed/performed Overall Cognitive Status: Within Functional Limits for tasks assessed  General Comments: agitated with SPTA as she reports feeling "rushed"      Exercises    General Comments        Pertinent Vitals/Pain Pain Assessment: 0-10 Pain Score: 6  Pain Location: abdomen and incision Pain Descriptors / Indicators: Discomfort;Sore Pain Intervention(s): Monitored during session    Home Living                      Prior Function            PT Goals (current goals can now be found in the care plan section) Acute Rehab PT Goals Patient Stated Goal: to go home Potential to Achieve Goals: Fair Progress towards PT goals: Not progressing toward goals - comment    Frequency    Min 3X/week      PT Plan Current  plan remains appropriate    Co-evaluation              AM-PAC PT "6 Clicks" Daily Activity  Outcome Measure  Difficulty turning over in bed (including adjusting bedclothes, sheets and blankets)?: Unable Difficulty moving from lying on back to sitting on the side of the bed? : Unable Difficulty sitting down on and standing up from a chair with arms (e.g., wheelchair, bedside commode, etc,.)?: Unable Help needed moving to and from a bed to chair (including a wheelchair)?: A Lot Help needed walking in hospital room?: A Lot Help needed climbing 3-5 steps with a railing? : Total 6 Click Score: 8    End of Session Equipment Utilized During Treatment: Oxygen;Gait belt Activity Tolerance: Treatment limited secondary to agitation Patient left: with nursing/sitter in room;Other (comment) Nurse Communication: Mobility status PT Visit Diagnosis: Unsteadiness on feet (R26.81);Difficulty in walking, not elsewhere classified (R26.2)     Time: 0623-7628 PT Time Calculation (min) (ACUTE ONLY): 41 min  Charges:  $Therapeutic Activity: 38-52 mins                    G Codes:       Fransisca Connors, SPTA    Kristin [name] 09/23/2017, 4:32 PM

## 2017-09-23 NOTE — Progress Notes (Signed)
7 Days Post-Op   Subjective/Chief Complaint: Complains of someone talking harshly to her. Otherwise she seems to be ok   Objective: Vital signs in last 24 hours: Temp:  [97.8 F (36.6 C)-98.6 F (37 C)] 98.3 F (36.8 C) (11/26 1523) Pulse Rate:  [87-104] 102 (11/26 1523) Resp:  [19-29] 29 (11/26 1523) BP: (97-115)/(62-71) 108/71 (11/26 1523) SpO2:  [89 %-100 %] 89 % (11/26 1523) FiO2 (%):  [28 %] 28 % (11/26 1339) Weight:  [47.4 kg (104 lb 8 oz)] 47.4 kg (104 lb 8 oz) (11/26 0346) Last BM Date: 09/13/17  Intake/Output from previous day: 11/25 0701 - 11/26 0700 In: 240 [P.O.:240] Out: 655 [Urine:600; Drains:55] Intake/Output this shift: Total I/O In: -  Out: 400 [Urine:400]  General appearance: alert and cooperative Resp: clear to auscultation bilaterally Chest wall: skin flaps look good Cardio: regular rate and rhythm GI: soft, non-tender; bowel sounds normal; no masses,  no organomegaly  Lab Results:  Recent Labs    09/21/17 0233  WBC 4.9  HGB 11.7*  HCT 36.5  PLT 216   BMET Recent Labs    09/21/17 0233  NA 140  K 4.5  CL 99*  CO2 30  GLUCOSE 99  BUN 15  CREATININE 0.66  CALCIUM 8.9   PT/INR No results for input(s): LABPROT, INR in the last 72 hours. ABG No results for input(s): PHART, HCO3 in the last 72 hours.  Invalid input(s): PCO2, PO2  Studies/Results: No results found.  Anti-infectives: Anti-infectives (From admission, onward)   Start     Dose/Rate Route Frequency Ordered Stop   09/16/17 0907  ceFAZolin (ANCEF) IVPB 2g/100 mL premix     2 g 200 mL/hr over 30 Minutes Intravenous On call to O.R. 09/16/17 0907 09/16/17 1220      Assessment/Plan: s/p Procedure(s): LEFT MASTECTOMY WITH SENTINEL LYMPH NODE BIOPSY (Left) Advance diet  She seems stable from a pulmonary standpoint. I would agree with snf placement when a bed is ready  LOS: 7 days    TOTH III,PAUL S 09/23/2017

## 2017-09-23 NOTE — NC FL2 (Signed)
[place] MEDICAID FL2 LEVEL OF CARE SCREENING TOOL     IDENTIFICATION  Patient Name: Kristin [name] Birthdate: 1946-03-29 Sex: female Admission Date (Current Location): 09/16/2017  [facility] and [place] Number:  Herbalist and Address:  [facility], [place] [address], [place], [place] [address]      Provider Number: [id number]  Attending Physician Name and Address:  Jovita Kussmaul, MD  Relative Name and Phone Number:       Current Level of Care: Hospital Recommended Level of Care: [place] Prior Approval Number:    Date Approved/Denied:   PASRR Number: [id number]  Discharge Plan: SNF    Current Diagnoses: Patient Active Problem List   Diagnosis Date Noted  . S/P left mastectomy 09/16/2017  . Breast cancer of lower-outer quadrant of left female breast ([place]) 09/08/2017  . Encounter for general adult medical examination with abnormal findings 05/31/2017  . Medicare annual wellness visit, initial 05/31/2017  . Pulmonary nodule 04/02/2017  . Upper abdominal pain 08/09/2016  . SOB (shortness of breath) 08/09/2016  . Upper respiratory infection 12/13/2015  . Generalized abdominal pain 01/28/2015  . Abdominal bloating 01/28/2015  . Neck pain 03/29/2014  . Smoker 03/20/2013  . EARLY SATIETY 12/19/2010  . DYSPHAGIA UNSPECIFIED 12/19/2010  . Dysphagia, unspecified 12/19/2010  . FLATULENCE-GAS-BLOATING 12/19/2010  . EARLY SATIETY 12/12/2010  . PERIPHERAL NEUROPATHY 02/21/2010  . FOOT PAIN, BILATERAL 02/21/2010  . WEIGHT LOSS, ABNORMAL 12/22/2009  . Type 2 diabetes mellitus, controlled ([place]) 11/11/2009  . HYPERLIPIDEMIA, MIXED 08/19/2009  . Allergic rhinitis 08/19/2009  . VITILIGO 05/18/2009  . DEGENERATIVE DISC DISEASE, CERVICAL SPINE 01/16/2009  . NECK PAIN, LEFT 01/06/2009  . DYSPHAGIA PHARYNGEAL PHASE 11/18/2008  . ABDOMINAL PAIN-GENERALIZED 06/18/2008  . UNSPECIFIED VITAMIN D DEFICIENCY 05/26/2008  . GERD  05/26/2008  . PUD, HX OF 05/26/2008  . SINUSITIS, ACUTE 03/24/2008  . EAR PAIN, LEFT 02/09/2008  . MYALGIA 02/09/2008  . FATIGUE 02/09/2008  . CHEST PAIN UNSPECIFIED 02/09/2008  . COPD GOLD D 05/22/2007  . BRONCHITIS, OBSTRUCTIVE CHRONIC W/EXACRB 04/24/2007  . Hypothyroidism 03/17/2007  . OCULAR MIGRAINE 03/17/2007  . Essential hypertension 03/17/2007    Orientation RESPIRATION BLADDER Height & Weight     Self, Time, Situation, Place  O2, Other (Comment)(Nasal Canula 2 L. Has not used bipap since 11/23: 10/5 at 40%.) Continent, Indwelling catheter Weight: 104 lb 8 oz (47.4 kg) Height:  5\' 6"  (167.6 cm)  BEHAVIORAL SYMPTOMS/MOOD NEUROLOGICAL BOWEL NUTRITION STATUS  (None) (None) Continent Diet(DYS 1)  AMBULATORY STATUS COMMUNICATION OF NEEDS Skin   Extensive Assist Verbally Other (Comment)(Catheter entry/exit.)                       Personal Care Assistance Level of Assistance  Bathing, Feeding, Dressing Bathing Assistance: Limited assistance Feeding assistance: Limited assistance Dressing Assistance: Maximum assistance     Functional Limitations Info  Sight, Hearing, Speech Sight Info: Adequate Hearing Info: Adequate Speech Info: Adequate    SPECIAL CARE FACTORS FREQUENCY  PT (By licensed PT), OT (By licensed OT), Speech therapy     PT Frequency: 5 x week OT Frequency: 5 x week     Speech Therapy Frequency: 5 x week      Contractures Contractures Info: Not present    Additional Factors Info  Code Status, Allergies Code Status Info: Full Allergies Info: Symbicort (Budesonide-formoterol Fumarate).           Current Medications (09/23/2017):  This is the current  hospital active medication list Current Facility-Administered Medications  Medication Dose Route Frequency Provider Last Rate Last Dose  . 0.9 %  sodium chloride infusion   Intravenous Continuous Javier Glazier, MD 10 mL/hr at 09/19/17 1355    . acetaminophen (TYLENOL) suppository 650 mg   650 mg Rectal Q4H PRN Javier Glazier, MD      . acetaminophen (TYLENOL) tablet 650 mg  650 mg Oral Q6H PRN Jill Alexanders, PA-C   650 mg at 09/23/17 1122  . albuterol (PROVENTIL) (2.5 MG/3ML) 0.083% nebulizer solution 2.5 mg  2.5 mg Nebulization Q6H PRN Autumn Messing III, MD   2.5 mg at 09/20/17 0347  . docusate (COLACE) 50 MG/5ML liquid 50 mg  50 mg Oral Daily Autumn Messing III, MD      . feeding supplement (BOOST / RESOURCE BREEZE) liquid 1 Container  1 Container Oral TID BM Jovita Kussmaul, MD   1 Container at 09/23/17 1216  . heparin injection 5,000 Units  5,000 Units Subcutaneous Q8H Autumn Messing III, MD   5,000 Units at 09/23/17 [phone]  . ipratropium-albuterol (DUONEB) 0.5-2.5 (3) MG/3ML nebulizer solution 3 mL  3 mL Nebulization TID Javier Glazier, MD   3 mL at 09/23/17 1338  . levothyroxine (SYNTHROID, LEVOTHROID) tablet 88 mcg  88 mcg Oral QAC breakfast Javier Glazier, MD   88 mcg at 09/23/17 7588  . MEDLINE mouth rinse  15 mL Mouth Rinse BID Autumn Messing III, MD   15 mL at 09/22/17 2308  . nicotine (NICODERM CQ - dosed in mg/24 hr) patch 7 mg  7 mg Transdermal Daily Omar Person, NP   7 mg at 09/23/17 0848  . nystatin (MYCOSTATIN) 100000 UNIT/ML suspension 500,000 Units  5 mL Mouth/Throat QID Javier Glazier, MD   500,000 Units at 09/23/17 1224  . ondansetron (ZOFRAN-ODT) disintegrating tablet 4 mg  4 mg Oral Q6H PRN Autumn Messing III, MD       Or  . ondansetron [facility]) injection 4 mg  4 mg Intravenous Q6H PRN Autumn Messing III, MD   4 mg at 09/17/17 1257  . phenol (CHLORASEPTIC) mouth spray 1 spray  1 spray Mouth/Throat PRN Jovita Kussmaul, MD   1 spray at 09/18/17 2044  . polyethylene glycol (MIRALAX / GLYCOLAX) packet 17 g  17 g Oral Q12H PRN Jill Alexanders, PA-C   17 g at 09/23/17 1123     Discharge Medications: Please see discharge summary for a list of discharge medications.  Relevant Imaging Results:  Relevant Lab Results:   Additional Information SS#:  [id number]  Candie Chroman, LCSW

## 2017-09-23 NOTE — Progress Notes (Signed)
On regular round found patient 02 sat 78% on 2L [place].. Increased 02 to 4L [place] and o2 sat slowly increased to 95%, Will cont to monitor.

## 2017-09-23 NOTE — Clinical Social Work Note (Signed)
Clinical Social Work Assessment  Patient Details  Name: Kristin [name] MRN: [id number] Date of Birth: 06/06/1946  Date of referral:  09/23/17               Reason for consult:  Facility Placement, Discharge Planning                Permission sought to share information with:  Chartered certified accountant granted to share information::  Yes, Verbal Permission Granted  Name::        Agency::  SNF's  Relationship::     Contact Information:     Housing/Transportation Living arrangements for the past 2 months:  Single Family Home Source of Information:  Patient, Medical Team Patient Interpreter Needed:  None Criminal Activity/Legal Involvement Pertinent to Current Situation/Hospitalization:  No - Comment as needed Significant Relationships:  Friend, Adult Children Lives with:  Self Do you feel safe going back to the place where you live?  Yes Need for family participation in patient care:  Yes (Comment)  Care giving concerns:  PT recommending SNF placement once medically stable for discharge.   Social Worker assessment / plan:  CSW met with patient. No supports at bedside. CSW introduced role and explained that PT recommendations would be discussed. Patient prefers to return home with home health rather than SNF. Patient discussed frustrations with having to repeat what happened to her so many times. Patient stated she feels that staff hear her but are not really listening and does not want this to continue at a SNF. She has never been to a SNF before, just gotten outpatient speech therapy. CSW provided support. Patient has a history in counseling. She got her undergraduate in psychiatry, masters in community agency counseling, and her phd in [place]. Patient stated that she has friends she can call to assist her at home as needed, including meal preparation. Patient is agreeable to CSW sending out SNF referral in case she changes her mind. CSW provided SNF list for review.  CSW send RNCM a message letting her know that patient is seriously considering HHPT. Patient was not set up with home health prior to admission. No further concerns. CSW encouraged patient to contact CSW as needed. CSW will continue to follow patient for support and facilitate discharge to SNF, if agreeable, once medically stable.  Employment status:  Retired Nurse, adult PT Recommendations:  [place] / Referral to community resources:  [place]  Patient/Family's Response to care:  Patient prefers home health over SNF. Patient's children and friends supportive and involved in patient's care. Patient appreciated social work intervention.  Patient/Family's Understanding of and Emotional Response to Diagnosis, Current Treatment, and Prognosis:  Patient has a good understanding of the reason for admission and her need for continued therapy once discharged. Patient is frustrated with having to repeat herself so much to staff.  Emotional Assessment Appearance:  Appears stated age Attitude/Demeanor/Rapport:  Other(Frustrated but pleasant) Affect (typically observed):  Appropriate, Calm, Pleasant, Frustrated Orientation:  Oriented to Self, Oriented to Place, Oriented to  Time, Oriented to Situation Alcohol / Substance use:  Tobacco Use Psych involvement (Current and /or in the community):  No (Comment)  Discharge Needs  Concerns to be addressed:  Care Coordination Readmission within the last 30 days:  No Current discharge risk:  Dependent with Mobility, Lives alone Barriers to Discharge:  Continued Medical Work up, [place], LCSW 09/23/2017, 1:50 PM

## 2017-09-23 NOTE — Progress Notes (Signed)
[place] Progress Note Patient Name: Kristin [name] DOB: Jun 16, 1946 MRN: [id number]   Date of Service  09/23/2017  HPI/Events of Note  Notified of urine culture positive for pseudomonas aeruginosa. Culture from 11/24. UA from 11/24 appears largely bland without suggestion of infection. Medication allergies reviewed. Organism is pansensitive.   eICU Interventions  1. Starting treatment with Cipro 500 mg by mouth twice a day-2 doses ordered 2. Defer to rounding physicians on continuing antibiotic therapy      Intervention Category Major Interventions: Infection - evaluation and management  Tera Partridge 09/23/2017, 8:30 PM

## 2017-09-23 NOTE — Clinical Social Work Placement (Signed)
   CLINICAL SOCIAL WORK PLACEMENT  NOTE  Date:  09/23/2017  Patient Details  Name: Kristin [name] MRN: [id number] Date of Birth: 03-07-1946  Clinical Social Work is seeking post-discharge placement for this patient at the [place] level of care (*CSW will initial, date and re-position this form in  chart as items are completed):  Yes   Patient/family provided with [place] Work Department's list of facilities offering this level of care within the geographic area requested by the patient (or if unable, by the patient's family).  Yes   Patient/family informed of their freedom to choose among providers that offer the needed level of care, that participate in Medicare, Medicaid or managed care program needed by the patient, have an available bed and are willing to accept the patient.  Yes   Patient/family informed of [facility]'s ownership interest in [facility] and [facility], as well as of the fact that they are under no obligation to receive care at these facilities.  PASRR submitted to EDS on 09/23/17     PASRR number received on 09/23/17     Existing PASRR number confirmed on       FL2 transmitted to all facilities in geographic area requested by pt/family on 09/23/17     FL2 transmitted to all facilities within larger geographic area on       Patient informed that his/her managed care company has contracts with or will negotiate with certain facilities, including the following:            Patient/family informed of bed offers received.  Patient chooses bed at       Physician recommends and patient chooses bed at      Patient to be transferred to   on  .  Patient to be transferred to facility by       Patient family notified on   of transfer.  Name of family member notified:        PHYSICIAN Please sign FL2     Additional Comment:    _______________________________________________ Candie Chroman, LCSW 09/23/2017,  1:58 PM

## 2017-09-23 NOTE — Progress Notes (Signed)
Pt. On 4L of 02. Trying to titrate off it. Not on O2 at home. Currently on two liters. sats at 90%

## 2017-09-23 NOTE — Clinical Social Work Note (Signed)
Bed offers provided: [facility] and [facility]. Patient asked about [facility]. They had not responded yet so CSW contacted admissions coordinator. They can take her tomorrow and will start authorization today for discharge tomorrow. Patient notified that she cannot smoke at the facility. She said she does not have any cigarettes and is on the patch.  Dayton Scrape, [place]

## 2017-09-24 DIAGNOSIS — C50512 Malignant neoplasm of lower-outer quadrant of left female breast: Secondary | ICD-10-CM | POA: Diagnosis not present

## 2017-09-24 DIAGNOSIS — R0602 Shortness of breath: Secondary | ICD-10-CM | POA: Diagnosis not present

## 2017-09-24 DIAGNOSIS — E440 Moderate protein-calorie malnutrition: Secondary | ICD-10-CM | POA: Diagnosis not present

## 2017-09-24 DIAGNOSIS — Z483 Aftercare following surgery for neoplasm: Secondary | ICD-10-CM | POA: Diagnosis not present

## 2017-09-24 DIAGNOSIS — J9600 Acute respiratory failure, unspecified whether with hypoxia or hypercapnia: Secondary | ICD-10-CM | POA: Diagnosis not present

## 2017-09-24 DIAGNOSIS — E039 Hypothyroidism, unspecified: Secondary | ICD-10-CM | POA: Diagnosis not present

## 2017-09-24 DIAGNOSIS — F419 Anxiety disorder, unspecified: Secondary | ICD-10-CM | POA: Diagnosis not present

## 2017-09-24 DIAGNOSIS — K5901 Slow transit constipation: Secondary | ICD-10-CM | POA: Diagnosis not present

## 2017-09-24 DIAGNOSIS — E785 Hyperlipidemia, unspecified: Secondary | ICD-10-CM | POA: Diagnosis not present

## 2017-09-24 DIAGNOSIS — Z9012 Acquired absence of left breast and nipple: Secondary | ICD-10-CM | POA: Diagnosis not present

## 2017-09-24 DIAGNOSIS — J449 Chronic obstructive pulmonary disease, unspecified: Secondary | ICD-10-CM | POA: Diagnosis not present

## 2017-09-24 DIAGNOSIS — R0789 Other chest pain: Secondary | ICD-10-CM | POA: Diagnosis not present

## 2017-09-24 DIAGNOSIS — J9611 Chronic respiratory failure with hypoxia: Secondary | ICD-10-CM | POA: Diagnosis not present

## 2017-09-24 DIAGNOSIS — R5381 Other malaise: Secondary | ICD-10-CM | POA: Diagnosis not present

## 2017-09-24 DIAGNOSIS — R1310 Dysphagia, unspecified: Secondary | ICD-10-CM | POA: Diagnosis not present

## 2017-09-24 DIAGNOSIS — M6281 Muscle weakness (generalized): Secondary | ICD-10-CM | POA: Diagnosis not present

## 2017-09-24 DIAGNOSIS — R262 Difficulty in walking, not elsewhere classified: Secondary | ICD-10-CM | POA: Diagnosis not present

## 2017-09-24 DIAGNOSIS — I10 Essential (primary) hypertension: Secondary | ICD-10-CM | POA: Diagnosis not present

## 2017-09-24 DIAGNOSIS — Z720 Tobacco use: Secondary | ICD-10-CM | POA: Diagnosis not present

## 2017-09-24 DIAGNOSIS — R488 Other symbolic dysfunctions: Secondary | ICD-10-CM | POA: Diagnosis not present

## 2017-09-24 DIAGNOSIS — E782 Mixed hyperlipidemia: Secondary | ICD-10-CM | POA: Diagnosis not present

## 2017-09-24 DIAGNOSIS — C801 Malignant (primary) neoplasm, unspecified: Secondary | ICD-10-CM | POA: Diagnosis not present

## 2017-09-24 DIAGNOSIS — J439 Emphysema, unspecified: Secondary | ICD-10-CM | POA: Diagnosis not present

## 2017-09-24 MED ORDER — HYDROCODONE-ACETAMINOPHEN 5-325 MG PO TABS: 1.0000 | ORAL_TABLET | Freq: Four times a day (QID) | ORAL | 0 refills | Status: DC | PRN

## 2017-09-24 NOTE — Consult Note (Addendum)
   [facility] CM Inpatient Consult   09/24/2017  Kristin [name] 1946/07/09 [id number]  Chart reviewed for [facility].  Patient is currently for skilled facility her disposition. Admitted with end stage COPD and recent diagnosis breast Cancer.  Met with the patient at the bedside.  Patient states she is to discharge today to [facility]  Will need to verify this with Education officer, museum.  Patient is verbally agreeable to [facility] following for transition planning for her return home to her apartment.  Patient was given a folder with [facility] information.  Will have follow up at the facility with [facility] involvement.   She states her primary care provider 'was' Terri Piedra, NP and Velora Heckler was assigning her another primary.  [facility] Care Management does not replace or interfere with any services arranged by the inpatient care management staff.    For questions, please contact:  Natividad Brood, RN BSN [facility] Liaison  [phone] business mobile phone Toll free office [phone]

## 2017-09-24 NOTE — Clinical Social Work Note (Addendum)
Insurance authorization still pending. CSW notified patient.  Dayton Scrape, [place]  1:25 pm Authorization still pending.  Dayton Scrape, [place]  3:32 pm Authorization still pending.  Dayton Scrape, CSW [phone]  4:13 pm Authorization approved for discharge to SNF today.  Dayton Scrape, [place]

## 2017-09-24 NOTE — Progress Notes (Signed)
8 Days Post-Op   Subjective/Chief Complaint: Feels better today   Objective: Vital signs in last 24 hours: Temp:  [97.7 F (36.5 C)-98.4 F (36.9 C)] 98.1 F (36.7 C) (11/27 0816) Pulse Rate:  [86-109] 90 (11/27 0816) Resp:  [21-30] 30 (11/27 0816) BP: (94-124)/(60-76) 124/64 (11/27 0816) SpO2:  [89 %-99 %] 95 % (11/27 0816) FiO2 (%):  [28 %] 28 % (11/26 1339) Weight:  [46.6 kg (102 lb 11.8 oz)] 46.6 kg (102 lb 11.8 oz) (11/27 0440) Last BM Date: 09/13/17  Intake/Output from previous day: 11/26 0701 - 11/27 0700 In: -  Out: 635 [Urine:600; Drains:35] Intake/Output this shift: No intake/output data recorded.  General appearance: alert and cooperative Resp: clear to auscultation bilaterally Chest wall: skin flaps look good Cardio: regular rate and rhythm GI: soft, non-tender; bowel sounds normal; no masses,  no organomegaly  Lab Results:  No results for input(s): WBC, HGB, HCT, PLT in the last 72 hours. BMET No results for input(s): NA, K, CL, CO2, GLUCOSE, BUN, CREATININE, CALCIUM in the last 72 hours. PT/INR No results for input(s): LABPROT, INR in the last 72 hours. ABG No results for input(s): PHART, HCO3 in the last 72 hours.  Invalid input(s): PCO2, PO2  Studies/Results: No results found.  Anti-infectives: Anti-infectives (From admission, onward)   Start     Dose/Rate Route Frequency Ordered Stop   09/24/17 0800  ciprofloxacin (CIPRO) tablet 500 mg  Status:  Discontinued     500 mg Oral 2 times daily 09/23/17 2032 09/23/17 2249   09/23/17 2249  ciprofloxacin (CIPRO) tablet 500 mg     500 mg Oral 2 times daily 09/23/17 2249 09/24/17 1959   09/23/17 2030  ciprofloxacin (CIPRO) tablet 500 mg  Status:  Discontinued     500 mg Oral 2 times daily 09/23/17 2029 09/23/17 2032   09/16/17 0907  ceFAZolin (ANCEF) IVPB 2g/100 mL premix     2 g 200 mL/hr over 30 Minutes Intravenous On call to O.R. 09/16/17 0907 09/16/17 1220      Assessment/Plan: s/p  Procedure(s): LEFT MASTECTOMY WITH SENTINEL LYMPH NODE BIOPSY (Left) Advance diet Discharge to snf Will need to follow up with me in 1 week Empty drains and record output twice a day  LOS: 8 days    TOTH III,PAUL S 09/24/2017

## 2017-09-24 NOTE — Discharge Summary (Signed)
Physician Discharge Summary  Patient ID: Kristin [name] MRN: [id number] DOB/AGE: May 20, 1946 71 y.o.  Admit date: 09/16/2017 Discharge date: 09/24/2017  Admission Diagnoses:  Discharge Diagnoses:  Active Problems:   Breast cancer of lower-outer quadrant of left female breast ([place])   S/P left mastectomy   Discharged Condition: fair  Hospital Course: the pt underwent left modified radical mastectomy. Her postoperative course was complicated by respiratory failure requiring mechanical ventilation. This was not unexpected given her underlying COPD and Neuromuscular disorder. She recovered and is now need snf placement.  Consults: pulmonary/intensive care  Significant Diagnostic Studies: none  Treatments: surgery: as above  Discharge Exam: Blood pressure 124/64, pulse 90, temperature 98.1 F (36.7 C), temperature source Oral, resp. rate (!) 30, height 5\' 6"  (1.676 m), weight 46.6 kg (102 lb 11.8 oz), SpO2 95 %. General appearance: alert and cooperative Resp: clear to auscultation bilaterally Chest wall: skin flaps look good Cardio: regular rate and rhythm GI: soft, non-tender; bowel sounds normal; no masses,  no organomegaly  Disposition: Final discharge disposition not confirmed  Discharge Instructions    Call MD for:  difficulty breathing, headache or visual disturbances   Complete by:  As directed    Call MD for:  extreme fatigue   Complete by:  As directed    Call MD for:  hives   Complete by:  As directed    Call MD for:  persistant dizziness or light-headedness   Complete by:  As directed    Call MD for:  persistant nausea and vomiting   Complete by:  As directed    Call MD for:  redness, tenderness, or signs of infection (pain, swelling, redness, odor or green/yellow discharge around incision site)   Complete by:  As directed    Call MD for:  severe uncontrolled pain   Complete by:  As directed    Call MD for:  temperature >100.4   Complete by:  As directed    Diet - low sodium heart healthy   Complete by:  As directed    Discharge instructions   Complete by:  As directed    Sponge bathe until drains are removed. Empty drains, record output, and recharge bulbs twice a day   Increase activity slowly   Complete by:  As directed    No wound care   Complete by:  As directed      Allergies as of 09/24/2017      Reactions   Symbicort [budesonide-formoterol Fumarate] Anaphylaxis, Other (See Comments)   Throat swelling      Medication List    TAKE these medications   albuterol (2.5 MG/3ML) 0.083% nebulizer solution Commonly known as:  PROVENTIL USE ONE VIAL IN NEBULIZER EVERY 4 HOURS AS NEEDED FOR  WHEEZING  OR  SHORTNESS  OF  BREATH   hydrochlorothiazide 25 MG tablet Commonly known as:  HYDRODIURIL Take 1 tablet by mouth once a day. Must make appt w/new provider for future refills   HYDROcodone-acetaminophen 5-325 MG tablet Commonly known as:  NORCO/VICODIN Take 1-2 tablets by mouth every 6 (six) hours as needed for moderate pain or severe pain.   losartan 50 MG tablet Commonly known as:  COZAAR TAKE 1 TABLET BY MOUTH ONCE DAILY What changed:    how much to take  how to take this  when to take this   rosuvastatin 5 MG tablet Commonly known as:  CRESTOR Take 1 tablet by mouth daily.  Must make appt w/new provider for future refills  SYNTHROID 88 MCG tablet Generic drug:  levothyroxine TAKE 1 TABLET BY MOUTH ONCE DAILY BEFORE BREAKFAST What changed:  See the new instructions.   Tiotropium Bromide-Olodaterol 2.5-2.5 MCG/ACT Aers Commonly known as:  STIOLTO RESPIMAT Inhale 2 puffs into the lungs daily.       Contact information for follow-up providers    Autumn Messing III, MD Follow up in 1 week(s).   Specialty:  General Surgery Contact information: [place] [address] [phone]            Contact information for after-discharge care    Destination    [facility]  SNF Follow up.   Service:  Skilled Nursing Contact information: [address] [place] [phone]                  Signed: Merrie Roof 09/24/2017, 8:31 AM

## 2017-09-24 NOTE — Progress Notes (Signed)
Physical Therapy Treatment Patient Details Name: Kristin [name] MRN: [id number] DOB: December 15, 1945 Today's Date: 09/24/2017    History of Present Illness 71 y.o. female with breast cancer, ongoing tobacco use, and known underlying severe COPD with pulmonary emphysema. Patient admitted yesterday for left mastectomy with sentinel lymph node biopsy. Postoperatively patient was extubated but had respiratory distress and acute hypercarbic respiratory failure, AMS,  requiring noninvasive positive pressure ventilation. Pt self-extubated on 11/21.    PT Comments    Pt in recliner upon entry. Decline ambulation. Tried to encourage to participate by explaining benefits of ambulation, pt became agitated and said "don't tell me that again." Pt declined standing and performing standing exercises due to fatigue from transferring to the chair. Agreed to perform seated exercises. Pt repeatedly stated "Just do what you gotta do" and would then say "I am not gonna do that. Stop telling me to do that." Pt performed exercises reluctantly. Asked SPTA to leave as she felt she couldn't breathe and was aggravated. O2 sats >90% entire treatment. HR 100-104 bpm. BP before session 103/71. Repositioned pt and notified RN pt was having difficulty breathing.  Follow Up Recommendations  SNF;Supervision/Assistance - 24 hour     Equipment Recommendations  None recommended by PT    Recommendations for Other Services       Precautions / Restrictions Precautions Precautions: Other (comment) Precaution Comments: watch O2 sats, limited L shoulder flex/abd to 90 deg Restrictions Weight Bearing Restrictions: No    Mobility  Bed Mobility        General bed mobility comments: Up in recliner upon entry.  Transfers       General transfer comment: Min assist for stability.   Ambulation/Gait                 Stairs            Wheelchair Mobility    Modified Rankin (Stroke Patients Only)        Balance                                       Cognition Arousal/Alertness: Awake/alert Behavior During Therapy: WFL for tasks assessed/performed Overall Cognitive Status: Within Functional Limits for tasks assessed                                 General Comments: Agitated with SPTA when educating on importance of ambulation and deep breathing. Said "Please just stop. Don't tell me that again."      Exercises General Exercises - Lower Extremity Ankle Circles/Pumps: AROM;Both;20 reps;Supine Quad Sets: AROM;Both;10 reps;Supine Short Arc Quad: AROM;Both;10 reps;Supine Heel Slides: AROM;Both;10 reps;Supine Straight Leg Raises: AROM;Both;10 reps;Supine    General Comments        Pertinent Vitals/Pain Pain Assessment: Faces Faces Pain Scale: Hurts little more Pain Location: abdomen; B LE cramping/spasm Pain Descriptors / Indicators: Discomfort;Sore Pain Intervention(s): Monitored during session;Repositioned    Home Living                      Prior Function            PT Goals (current goals can now be found in the care plan section) Acute Rehab PT Goals Patient Stated Goal: none discussed PT Goal Formulation: With patient Time For Goal Achievement: 09/27/17 Potential to Achieve Goals: Fair Progress towards PT  goals: Not progressing toward goals - comment    Frequency    Min 3X/week      PT Plan Current plan remains appropriate    Co-evaluation              AM-PAC PT "6 Clicks" Daily Activity  Outcome Measure  Difficulty turning over in bed (including adjusting bedclothes, sheets and blankets)?: Unable Difficulty moving from lying on back to sitting on the side of the bed? : Unable Difficulty sitting down on and standing up from a chair with arms (e.g., wheelchair, bedside commode, etc,.)?: Unable Help needed moving to and from a bed to chair (including a wheelchair)?: A Lot Help needed walking in hospital room?:  A Lot Help needed climbing 3-5 steps with a railing? : Total 6 Click Score: 8    End of Session Equipment Utilized During Treatment: Oxygen;Gait belt Activity Tolerance: Treatment limited secondary to agitation;Patient limited by fatigue Patient left: in chair;with call bell/phone within reach Nurse Communication: Mobility status PT Visit Diagnosis: Unsteadiness on feet (R26.81);Difficulty in walking, not elsewhere classified (R26.2)     Time: 2500-3704 PT Time Calculation (min) (ACUTE ONLY): 25 min  Charges:  $Therapeutic Exercise: 23-37 mins                    G Codes:       Janna Arch, SPTA   Janna Arch 09/24/2017, 2:46 PM

## 2017-09-24 NOTE — Clinical Social Work Note (Signed)
CSW facilitated patient discharge including contacting patient family and facility to confirm patient discharge plans. Clinical information faxed to facility and family agreeable with plan. CSW arranged ambulance transport via [facility] to [place]. RN to call report prior to discharge [phone] Room [phone]).  CSW will sign off for now as social work intervention is no longer needed. Please consult [place] again if new needs arise.  Dayton Scrape, [place]

## 2017-09-24 NOTE — Progress Notes (Signed)
Occupational Therapy Treatment Patient Details Name: Kristin [name] MRN: [id number] DOB: [birth year] Today's Date: 09/24/2017    History of present illness 71 y.o. female with breast cancer, ongoing tobacco use, and known underlying severe COPD with pulmonary emphysema. Patient admitted yesterday for left mastectomy with sentinel lymph node biopsy. Postoperatively patient was extubated but had respiratory distress and acute hypercarbic respiratory failure, AMS,  requiring noninvasive positive pressure ventilation. Pt self-extubated on 11/21.   OT comments  Pt demonstrating progress toward OT goals this session. She was able to complete simulated stand-pivot toilet transfer with min handheld assistance this session. Pt presents with anxious behavior regarding OOB mobility and benefited from education concerning pursed lip breathing techniques and relaxation techniques. VSS on 2L O2 throughout session. D/C recommendation remains appropriate. OT will continue to follow while admitted.    Follow Up Recommendations  SNF    Equipment Recommendations  3 in 1 bedside commode    Recommendations for Other Services      Precautions / Restrictions Precautions Precautions: Other (comment) Precaution Comments: watch O2 sats, limited L shoulder flex/abd to 90 deg Restrictions Weight Bearing Restrictions: No       Mobility Bed Mobility Overal bed mobility: Needs Assistance Bed Mobility: Rolling;Sidelying to Sit Rolling: Min assist Sidelying to sit: Mod assist;HOB elevated       General bed mobility comments: Mod assist to raise trunk from [facility]  Transfers Overall transfer level: Needs assistance Equipment used: 1 person hand held assist Transfers: Sit to/from [facility] Sit to Stand: Min assist Stand pivot transfers: Min assist       General transfer comment: Min assist for stability.     Balance Overall balance assessment: Needs assistance Sitting-balance  support: Feet supported;Single extremity supported Sitting balance-Leahy Scale: Fair     Standing balance support: Single extremity supported;During functional activity Standing balance-Leahy Scale: Poor Standing balance comment: Relies on single UE support.                            ADL either performed or assessed with clinical judgement   ADL Overall ADL's : Needs assistance/impaired Eating/Feeding: Set up;Sitting                       Toilet Transfer: Minimal assistance;Stand-pivot Toilet Transfer Details (indicate cue type and reason): Simulated from bed to chair.          Functional mobility during ADLs: Minimal assistance General ADL Comments: Min assist to transfer from bed to chair for simulated toilet transfer. ABle to participate well but does demonstrate significant generalized weakness and decreased activity tolerance.      Vision       Perception     Praxis      Cognition Arousal/Alertness: Awake/alert Behavior During Therapy: WFL for tasks assessed/performed Overall Cognitive Status: Within Functional Limits for tasks assessed                                          Exercises     Shoulder Instructions       General Comments      Pertinent Vitals/ Pain       Pain Assessment: Faces Faces Pain Scale: Hurts even more Pain Location: abdomen and incision: B LE cramping/spasm Pain Descriptors / Indicators: Discomfort;Sore Pain Intervention(s): Monitored during session  Home Living  Prior Functioning/Environment              Frequency  Min 2X/week        Progress Toward Goals  OT Goals(current goals can now be found in the care plan section)  Progress towards OT goals: Progressing toward goals  Acute Rehab OT Goals Patient Stated Goal: rehab then home OT Goal Formulation: With patient Time For Goal Achievement: 10/04/17 Potential to  Achieve Goals: Good  Plan Discharge plan remains appropriate    Co-evaluation                 AM-PAC PT "6 Clicks" Daily Activity     Outcome Measure   Help from another person eating meals?: None Help from another person taking care of personal grooming?: A Little Help from another person toileting, which includes using toliet, bedpan, or urinal?: A Little Help from another person bathing (including washing, rinsing, drying)?: A Little Help from another person to put on and taking off regular upper body clothing?: A Little Help from another person to put on and taking off regular lower body clothing?: A Little 6 Click Score: 19    End of Session Equipment Utilized During Treatment: Oxygen(2LO2)  OT Visit Diagnosis: Muscle weakness (generalized) (M62.81);Adult, failure to thrive (R62.7);Pain Pain - Right/Left: Left Pain - part of body: Shoulder   Activity Tolerance Patient tolerated treatment well   Patient Left in bed;with call bell/phone within reach;with bed alarm set   Nurse Communication Mobility status        Time: 3159-4585 OT Time Calculation (min): 19 min  Charges: OT General Charges $OT Visit: 1 Visit OT Treatments $Self Care/Home Management : 8-22 mins  Norman Herrlich, MS OTR/L  Pager: [place] [facility] [place] 09/24/2017, 10:26 AM

## 2017-09-24 NOTE — Consult Note (Signed)
   [facility] CM Inpatient Consult   09/24/2017  Kristin [name] [birth year] [id number]   Duplicate/Error

## 2017-09-24 NOTE — Clinical Social Work Placement (Signed)
   CLINICAL SOCIAL WORK PLACEMENT  NOTE  Date:  09/24/2017  Patient Details  Name: Kristin [name] MRN: [id number] Date of Birth: [birth year]  Clinical Social Work is seeking post-discharge placement for this patient at the [place] level of care (*CSW will initial, date and re-position this form in  chart as items are completed):  Yes   Patient/family provided with [facility] Work Department's list of facilities offering this level of care within the geographic area requested by the patient (or if unable, by the patient's family).  Yes   Patient/family informed of their freedom to choose among providers that offer the needed level of care, that participate in Medicare, Medicaid or managed care program needed by the patient, have an available bed and are willing to accept the patient.  Yes   Patient/family informed of [facility]'s ownership interest in [facility] and [facility], as well as of the fact that they are under no obligation to receive care at these facilities.  PASRR submitted to EDS on 09/23/17     PASRR number received on 09/23/17     Existing PASRR number confirmed on       FL2 transmitted to all facilities in geographic area requested by pt/family on 09/23/17     FL2 transmitted to all facilities within larger geographic area on       Patient informed that his/her managed care company has contracts with or will negotiate with certain facilities, including the following:        Yes   Patient/family informed of bed offers received.  Patient chooses bed at [facility] recommends and patient chooses bed at      Patient to be transferred to [facility] on 09/24/17.  Patient to be transferred to facility by [facility]     Patient family notified on 09/24/17 of transfer.  Name of family member notified:  Patient notified her son on the phone while CSW in the room.     PHYSICIAN    Additional Comment:    _______________________________________________ Candie Chroman, LCSW 09/24/2017, 4:32 PM

## 2017-09-24 NOTE — Progress Notes (Addendum)
  Speech Language Pathology Treatment: Dysphagia  Patient Details Name: Kristin [name] MRN: [id number] DOB: 10-11-1946 Today's Date: 09/24/2017 Time: 1103-1594 SLP Time Calculation (min) (ACUTE ONLY): 12 min  Assessment / Plan / Recommendation Clinical Impression  Pt seen for observation and review of diet texture and precautions. She reports tolerating puree without oral or pharyngeal difficulty (on puree at home). She likes to eat only during the hours of "3:30am-2:00pm" due to discomfort with gas after 2:00. SLP advised to consume most nutritious foods first (protein, vegetables). Mild pursed lip breathing following 2 sips gingerale. She verbalized anxiety about drinking, stating "I may regret it". Encouraged her to take breaks during meals. Also noted pt's last BM was 11/16 that likely contributing to po consumption. Continue puree texture, thin liquids. No further ST needed.    HPI HPI: Pt is a 71 year old female with end stage COPD and recent diagnosis breast Ca admitted 11/19 for L mastectomy. Post-procedure she was extubated but had respiratory distress and was placed on BiPAP. PMH also includes pre-diabetes, PNA, motor neuron disease, esophageal reflux, dyspnea, and dysphagia over the last 10 years per pt report. MBS completed in May 2015 showed a functional oropharyngeal swallow, although testing was limited as pt would not consume straw sips, larger boluses of purees, or any more solid textures. Pt also says that she had EGD and gastric emptying study that were also [facility].      SLP Plan  All goals met;Discharge SLP treatment due to (comment)       Recommendations  Diet recommendations: Dysphagia 1 (puree);Thin liquid Liquids provided via: Cup;No straw Medication Administration: Whole meds with puree Supervision: Patient able to self feed Compensations: Slow rate;Small sips/bites;Follow solids with liquid Postural Changes and/or Swallow Maneuvers: Seated upright 90 degrees;Upright  30-60 min after meal                Oral Care Recommendations: Oral care BID Follow up Recommendations: None SLP Visit Diagnosis: Dysphagia, unspecified (R13.10) Plan: All goals met;Discharge SLP treatment due to (comment)       GO                [place] Siren 09/24/2017, 11:38 AM  Orbie Pyo Colvin Caroli.[employer] [phone]

## 2017-09-25 ENCOUNTER — Other Ambulatory Visit: Payer: Self-pay | Admitting: Licensed Clinical Social Worker

## 2017-09-25 ENCOUNTER — Encounter: Payer: Self-pay | Admitting: Adult Health

## 2017-09-25 ENCOUNTER — Non-Acute Institutional Stay (SKILLED_NURSING_FACILITY): Payer: Medicare HMO | Admitting: Adult Health

## 2017-09-25 DIAGNOSIS — J9611 Chronic respiratory failure with hypoxia: Secondary | ICD-10-CM | POA: Diagnosis not present

## 2017-09-25 DIAGNOSIS — C50512 Malignant neoplasm of lower-outer quadrant of left female breast: Secondary | ICD-10-CM

## 2017-09-25 DIAGNOSIS — R1310 Dysphagia, unspecified: Secondary | ICD-10-CM

## 2017-09-25 DIAGNOSIS — Z170 Estrogen receptor positive status [ER+]: Secondary | ICD-10-CM | POA: Diagnosis not present

## 2017-09-25 DIAGNOSIS — I10 Essential (primary) hypertension: Secondary | ICD-10-CM | POA: Diagnosis not present

## 2017-09-25 DIAGNOSIS — E039 Hypothyroidism, unspecified: Secondary | ICD-10-CM | POA: Diagnosis not present

## 2017-09-25 DIAGNOSIS — R5381 Other malaise: Secondary | ICD-10-CM | POA: Diagnosis not present

## 2017-09-25 DIAGNOSIS — K5901 Slow transit constipation: Secondary | ICD-10-CM

## 2017-09-25 DIAGNOSIS — J449 Chronic obstructive pulmonary disease, unspecified: Secondary | ICD-10-CM | POA: Diagnosis not present

## 2017-09-25 DIAGNOSIS — E785 Hyperlipidemia, unspecified: Secondary | ICD-10-CM | POA: Diagnosis not present

## 2017-09-25 NOTE — Progress Notes (Signed)
Location:  [facility] Room Number: 307-A Place of Service:  SNF (31) Provider: Senaida Lange. Medina-Vargas - NP     Patient Care Team: Golden Circle, FNP as PCP - General (Family Medicine) Jovita Kussmaul, MD as Consulting Physician (General Surgery) Truitt Merle, MD as Consulting Physician (Hematology) Alla Feeling, NP as Nurse Practitioner (Nurse Practitioner)  Extended Emergency Contact Information Primary Emergency Contact: Waldon Merl [place] Phone: [phone] Relation: Son Secondary Emergency Contact: Jerry Caras [place] Phone: [phone] Mobile Phone: [phone] Relation: Friend  Code Status:  Full Code Goals of care: Advanced Directive information Advanced Directives 09/22/2017  Does Patient Have a Medical Advance Directive? No  Would patient like information on creating a medical advance directive? No - Patient declined     Chief Complaint  Patient presents with  . Acute Visit    Follow up on hospitalization at [facility] 11/19-11/27/18 for malignant neoplasm    HPI:  Pt is a 71 y.o. female seen today for hospital followup.  She was admitted to [facility] on 09/25/17 for short-term rehabilitation following an admission at [facility] 09/16/17-09/24/17 for S/P left modified radical mastectomy on 09/16/17 for breast cancer of the lower-outer quadrant of her left breast. Post-op, she had a complication of respiratory failure requiring mechanical ventilation on 09/18/17. She self-extubated later on on the same day. She was seen in the room today. She complained of constipation. She said that she has irritable bowel syndrome and she is either constipated or having diarrhea. She was noted to have foley catheter draining to urine bag. She has O2 @ 3L/min continuously. She has a PMH of essential hypertension, ocular migraine, COPD, IBS, GERD, hypothyroidism, DDD of cervical  spine, and HLD.     Past Medical History:  Diagnosis Date  . Acute sinusitis, unspecified   . Breast calcifications   . Breast cancer ([place])   . Cervicalgia   . Chest pain, unspecified   . Complication of anesthesia    hard time waking up - cant remember how long ago  . COPD (chronic obstructive pulmonary disease) ([place])   . Degeneration of cervical intervertebral disc   . Diverticulosis of colon (without mention of hemorrhage)   . Dyspnea   . Esophageal reflux   . Hyperlipidemia   . IBS (irritable bowel syndrome)   . 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 Neuromuscular disorder ([place])    motor neuron disease  . Obstructive chronic bronchitis with exacerbation ([place])   . Personal history of peptic ulcer disease   . Pneumonia    hx  . Pre-diabetes    per patient  . Unspecified essential hypertension   . Unspecified hypothyroidism   . Unspecified vitamin D deficiency   . Vitiligo    Past Surgical History:  Procedure Laterality Date  . ABDOMINAL HYSTERECTOMY W/ PARTIAL VAGINACTOMY    . BREAST BIOPSY Right   . BREAST EXCISIONAL BIOPSY Right 1998  . COLONOSCOPY  05/19/2012  . ESOPHAGOGASTRODUODENOSCOPY  01/03/2011   normal   . MASTECTOMY W/ SENTINEL NODE BIOPSY Left 09/16/2017   Procedure: LEFT MASTECTOMY WITH SENTINEL LYMPH NODE BIOPSY;  Surgeon: Jovita Kussmaul, MD;  Location: [facility];  Service: General;  Laterality: Left;  Marland Kitchen MULTIPLE TOOTH EXTRACTIONS    . THYROIDECTOMY, PARTIAL    . TONSILLECTOMY      Allergies  Allergen Reactions  . Symbicort [Budesonide-Formoterol Fumarate] Anaphylaxis and Other (See Comments)    Throat  swelling    Outpatient Encounter Medications as of 09/25/2017  Medication Sig  . albuterol (PROVENTIL) (2.5 MG/3ML) 0.083% nebulizer solution USE ONE VIAL IN NEBULIZER EVERY 4 HOURS AS NEEDED FOR  WHEEZING  OR  SHORTNESS  OF  BREATH  . hydrochlorothiazide (HYDRODIURIL) 25 MG tablet Take 1 tablet by mouth once a day. Must make appt w/new provider for future  refills  . HYDROcodone-acetaminophen (NORCO/VICODIN) 5-325 MG tablet Take 1 tablet by mouth every 6 (six) hours as needed for moderate pain or severe pain.  Marland Kitchen losartan (COZAAR) 50 MG tablet TAKE 1 TABLET BY MOUTH ONCE DAILY  . rosuvastatin (CRESTOR) 5 MG tablet Take 1 tablet by mouth daily.  Must make appt w/new provider for future refills  . SYNTHROID 88 MCG tablet TAKE 1 TABLET BY MOUTH ONCE DAILY BEFORE BREAKFAST  . Tiotropium Bromide-Olodaterol (STIOLTO RESPIMAT) 2.5-2.5 MCG/ACT AERS Inhale 2 puffs into the lungs daily.  . [DISCONTINUED] HYDROcodone-acetaminophen (NORCO/VICODIN) 5-325 MG tablet Take 1-2 tablets by mouth every 6 (six) hours as needed for moderate pain or severe pain.   No facility-administered encounter medications on file as of 09/25/2017.     Review of Systems  GENERAL: No change in appetite, no fatigue, no weight changes, no fever, chills or weakness MOUTH and THROAT: Denies oral discomfort, gingival pain or bleeding, pain from teeth or hoarseness   RESPIRATORY: no cough, SOB, DOE, wheezing, hemoptysis CARDIAC: No chest pain, edema or palpitations GI: +constipation GU: Denies  discharge PSYCHIATRIC: Denies feelings of depression or anxiety. No report of hallucinations, insomnia, paranoia, or agitation   Immunization History  Administered Date(s) Administered  . Influenza Split 08/04/2016  . Influenza Whole 07/21/2008, 08/19/2009, 08/30/2010, 07/29/2012  . Influenza, High Dose Seasonal PF 06/28/2017  . Influenza,inj,Quad PF,6+ Mos 07/08/2015  . Pneumococcal Conjugate-13 10/07/2015  . Pneumococcal Polysaccharide-23 10/30/2003  . Td 10/30/2003  . Tdap 06/28/2017  . Zoster 05/26/2008   Pertinent  Health Maintenance Due  Topic Date Due  . OPHTHALMOLOGY EXAM  07/10/2016  . PNA vac Low Risk Adult (2 of 2 - PPSV23) 10/06/2016  . FOOT EXAM  02/13/2017  . HEMOGLOBIN A1C  02/25/2018  . MAMMOGRAM  03/01/2019  . COLONOSCOPY  05/19/2022  . INFLUENZA VACCINE   Completed  . DEXA SCAN  Completed   Fall Risk  08/12/2017 03/26/2017  Falls in the past year? No No    Vitals:   09/25/17 0835  BP: 122/70  Pulse: 68  Resp: 16  Temp: (!) 97.2 F (36.2 C)  TempSrc: Oral  SpO2: 100%  Weight: 102 lb 11.7 oz (46.6 kg)  Height: 5' 6"  (1.676 m)   Body mass index is 16.58 kg/m.   Physical Exam  GENERAL APPEARANCE: Well nourished. In no acute distress.  SKIN:  Left chest surgical site is dry, no redness, covered with abdominal pad and a tube binder MOUTH and THROAT: Lips are without lesions. Oral mucosa is moist and without lesions. RESPIRATORY: Breathing is even & unlabored, BS CTAB CARDIAC: RRR, no murmur,no extra heart sounds, no edema GI: Abdomen soft, normal BS, no masses, no tenderness EXTREMITIES:  Able to move X 4 extremities PSYCHIATRIC: Alert and oriented X 3. Affect and behavior are appropriate   Labs reviewed: Recent Labs    09/19/17 0223 09/20/17 0224 09/21/17 0233  NA 136 140 140  K 4.2 4.3 4.5  CL 101 100* 99*  CO2 29 34* 30  GLUCOSE 70 90 99  BUN 8 14 15   CREATININE 0.70 0.63 0.66  CALCIUM 8.0* 8.6* 8.9  MG 2.0 1.9 2.0  PHOS 3.3 2.8 2.8   Recent Labs    02/05/17 0920 05/31/17 0903 09/19/17 0223 09/20/17 0224 09/21/17 0233  AST 22 23  --   --   --   ALT 13 16  --   --   --   ALKPHOS 66 63  --   --   --   BILITOT 1.2 1.2  --   --   --   PROT 7.5 7.4  --   --   --   ALBUMIN 4.5 4.4 3.1* 3.1* 3.1*   Recent Labs    09/19/17 0223 09/20/17 0224 09/21/17 0233  WBC 6.0 4.7 4.9  HGB 10.7* 11.0* 11.7*  HCT 34.0* 34.8* 36.5  MCV 98.0 97.8 95.5  PLT 176 188 216   Lab Results  Component Value Date   TSH 1.227 09/17/2017   Lab Results  Component Value Date   HGBA1C 6.2 (H) 08/27/2017   Lab Results  Component Value Date   CHOL 132 05/31/2017   HDL 76.30 05/31/2017   LDLCALC 43 05/31/2017   TRIG 63.0 05/31/2017   CHOLHDL 2 05/31/2017    Significant Diagnostic Results in last 30 days:  Nm Sentinel  Node Inj-no Rpt (breast)  Result Date: 09/16/2017 Sulfur colloid was injected by the nuclear medicine technologist for melanoma sentinel node.   Dg Chest Port 1 View  Result Date: 09/19/2017 CLINICAL DATA:  Short of breath.  Ventilator dependent EXAM: PORTABLE CHEST 1 VIEW COMPARISON:  09/18/2017 FINDINGS: Endotracheal tube no longer visualized. New severe apical emphysema unchanged. Bibasilar airspace disease right greater than left with mild progression. Negative for effusion IMPRESSION: Bibasilar airspace disease with mild progression. Endotracheal tube no longer visualized. Electronically Signed   By: Franchot Gallo M.D.   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 The endotracheal tube tip is proximally 6 cm above the level of the carina. The lungs are hyperexpanded. Cardiomediastinal contours are normal. IMPRESSION: Endotracheal tube tip greater than 5 cm above the inferior carina. COPD without acute airspace disease. Electronically Signed   By: Ulyses Jarred M.D.   On: 09/18/2017 03:40   Dg Abd Portable 1v  Result Date: 09/18/2017 CLINICAL DATA:  Orogastric tube placement EXAM: PORTABLE ABDOMEN - 1 VIEW COMPARISON:  CT abdomen and pelvis February 07, 2017 FINDINGS: Orogastric tube tip is in the proximal stomach with the side port at the gastroesophageal junction. There are loops of mildly dilated bowel in the upper abdomen. No free air evident. IMPRESSION: Orogastric tube tip in proximal stomach with side port at gastroesophageal junction. Advise advancing nasogastric tube 6-8 cm to confirm that both nasogastric tube tip and side port are within the stomach. Mild bowel dilatation. There may be a degree of underlying ileus. No free air evident. Electronically Signed   By: Lowella Grip III M.D.   On:  09/18/2017 07:19   US Breast Ltd Uni Left Inc Axilla  Result Date: 08/29/2017 CLINICAL DATA:  Palpable lump left breast EXAM: 2D DIGITAL DIAGNOSTIC LEFT MAMMOGRAM WITH CAD AND ADJUNCT TOMO ULTRASOUND LEFT BREAST COMPARISON:  Previous exam(s). ACR Breast Density Category d: The breast tissue is extremely dense, which lowers the sensitivity of mammography. FINDINGS: CC and MLO views of the left breast are submitted. In the  place of previously noted linear branching calcifications of lateral left breast, there is now an associated mass measuring 1.1 cm. Mammographic images were processed with CAD. On physical exam, there is a palpable are 1.5 cm mass at the left breast 3 to 4 o'clock position. Targeted ultrasound is performed, showing irregular hypoechoic mass abutting the skin and the chest wall measuring 1.3 x 0.9 x 1.1 cm at the left breast 4 o'clock 3 cm from nipple. Ultrasound of the left axilla demonstrate several abnormal thickened cortex left axillary lymph nodes. IMPRESSION: Highly suspicious findings. RECOMMENDATION: Recommend ultrasound-guided core biopsy of left breast mass. The abnormal lymph nodes in the left axilla are not accessible via ultrasound-guided core biopsy because the patient is cachectic and it would not be feasible for both the ultrasound probe and the biopsy apparatus to be in the space simultaneously to perform the biopsy. I have discussed the findings and recommendations with the patient. Results were also provided in writing at the conclusion of the visit. If applicable, a reminder letter will be sent to the patient regarding the next appointment. BI-RADS CATEGORY  5: Highly suggestive of malignancy. Electronically Signed   By: Abelardo Diesel M.D.   On: 08/29/2017 13:35   Mm Diag Breast Tomo Uni Left  Result Date: 08/29/2017 CLINICAL DATA:  Palpable lump left breast EXAM: 2D DIGITAL DIAGNOSTIC LEFT MAMMOGRAM WITH CAD AND ADJUNCT TOMO ULTRASOUND LEFT BREAST COMPARISON:  Previous  exam(s). ACR Breast Density Category d: The breast tissue is extremely dense, which lowers the sensitivity of mammography. FINDINGS: CC and MLO views of the left breast are submitted. In the place of previously noted linear branching calcifications of lateral left breast, there is now an associated mass measuring 1.1 cm. Mammographic images were processed with CAD. On physical exam, there is a palpable are 1.5 cm mass at the left breast 3 to 4 o'clock position. Targeted ultrasound is performed, showing irregular hypoechoic mass abutting the skin and the chest wall measuring 1.3 x 0.9 x 1.1 cm at the left breast 4 o'clock 3 cm from nipple. Ultrasound of the left axilla demonstrate several abnormal thickened cortex left axillary lymph nodes. IMPRESSION: Highly suspicious findings. RECOMMENDATION: Recommend ultrasound-guided core biopsy of left breast mass. The abnormal lymph nodes in the left axilla are not accessible via ultrasound-guided core biopsy because the patient is cachectic and it would not be feasible for both the ultrasound probe and the biopsy apparatus to be in the space simultaneously to perform the biopsy. I have discussed the findings and recommendations with the patient. Results were also provided in writing at the conclusion of the visit. If applicable, a reminder letter will be sent to the patient regarding the next appointment. BI-RADS CATEGORY  5: Highly suggestive of malignancy. Electronically Signed   By: Abelardo Diesel M.D.   On: 08/29/2017 13:35   Mm Clip Placement Left  Result Date: 08/29/2017 CLINICAL DATA:  Status post ultrasound-guided core biopsy of left breast mass EXAM: DIAGNOSTIC LEFT MAMMOGRAM POST ULTRASOUND BIOPSY COMPARISON:  Previous exam(s). FINDINGS: Mammographic images were obtained following left breast ultrasound guided biopsy of mass at 4 o'clock. Cc and lateral views of the left breast demonstrate ribbon biopsy clip 0.5 cm anterior and superior to the mass and  calcifications of concern. IMPRESSION: Post biopsy mammogram as described. Final Assessment: Post Procedure Mammograms for Marker Placement Electronically Signed   By: Abelardo Diesel M.D.   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 This was found to be concordant by Dr. Abelardo Diesel. Pathology results were discussed with the patient by telephone. The patient reported doing well after the biopsy with tenderness at the site. Post biopsy instructions and care were reviewed and questions were answered. The patient was encouraged to call [facility] for any additional concerns. Surgical consultation has been arranged with Dr. Autumn Messing at [facility] on August 30, 2017. Pathology results reported by Terie Purser, RN on 08/30/2017. Electronically Signed   By: Abelardo Diesel M.D.   On: 08/30/2017 13:31   Result Date: 09/02/2017 CLINICAL DATA:  Suspicious mass in the left breast for biopsy. EXAM: ULTRASOUND GUIDED LEFT BREAST CORE NEEDLE BIOPSY COMPARISON:  Previous exam(s). FINDINGS: I met with the patient and we discussed the procedure of ultrasound-guided biopsy, including benefits and alternatives. We discussed the high likelihood of a successful procedure. We discussed the risks of the procedure, including infection, bleeding, tissue injury, clip migration, and inadequate sampling. Informed written consent was given. The usual time-out protocol was performed immediately prior to the procedure. Lesion quadrant: Lateral lower quadrant Using sterile technique and 1% Lidocaine as local anesthetic, under direct ultrasound visualization, a 14 gauge spring-loaded device was used to perform biopsy of spiculated mass at the left  breast using a superior approach. At the conclusion of the procedure a ribbon tissue marker clip was deployed into the biopsy cavity. Follow up 2 view mammogram was performed and dictated separately. IMPRESSION: Ultrasound guided biopsy of left breast.  No apparent complications. Electronically Signed: By: Abelardo Diesel M.D. On: 08/29/2017 14:29    Assessment/Plan  1. Physical deconditioning - for rehabilitation, PT and OT, 40-50 strengthening exercises, fall precautions   2. Malignant neoplasm of lower-outer quadrant of left breast of female, estrogen receptor positive ([place]) - S/P left modified radical mastectomy, for rehabilitation with PT and OT, for therapeutic strengthening exercises, continue Norco 5-325 milligrams 1 tab every 6 hours when necessary for pain, follow-up with surgery, Dr. Autumn Messing, in 1 week   3. COPD GOLD D - no wheezing nor SOB, continue albuterol when necessary and Stiolto  Respimat inhale 2 puffs into the lungs [place]   4. Hypothyroidism, unspecified type - continue Synthroid 88 g 1 tab daily Lab Results  Component Value Date   TSH 1.227 09/17/2017     5. Essential hypertension - continue Cozaar 50 mg 1 tab daily and hydrochlorothiazide 25 mg 1 tab daily   6. Hyperlipidemia, unspecified hyperlipidemia type - continue rosuvastatin 5 mg 1 tab daily Lab Results  Component Value Date   CHOL 132 05/31/2017   HDL 76.30 05/31/2017   LDLCALC 43 05/31/2017   TRIG 63.0 05/31/2017   CHOLHDL 2 05/31/2017     7. Chronic respiratory failure with hypoxia - at 3 L/minute via [place] continuously, albuterol when necessary and Stiolto  Respimat inhale 2 puffs into the lungs daily  8. Slow transit constipation - start senna S 8.6-50 mg 2 tabs by mouth twice a day 3 days then when necessary  9. Dysphagia - continue pure heart healthy diet with thin liquids, aspiration precautions     Family/ staff Communication: :  Discussed plan with patient and charge nurse. Plan to  discontinue foley catheter today.   Labs/tests ordered:  None  Tasman Zapata C. [place] - NP [employer] [phone]

## 2017-09-25 NOTE — Patient Outreach (Signed)
[name]) Care Management  [facility] Social Work  09/25/2017  Kristin [name] [birth year] [phone]  Encounter Medications:  Outpatient Encounter Medications as of 09/25/2017  Medication Sig  . albuterol (PROVENTIL) (2.5 MG/3ML) 0.083% nebulizer solution USE ONE VIAL IN NEBULIZER EVERY 4 HOURS AS NEEDED FOR  WHEEZING  OR  SHORTNESS  OF  BREATH  . hydrochlorothiazide (HYDRODIURIL) 25 MG tablet Take 1 tablet by mouth once a day. Must make appt w/new provider for future refills  . HYDROcodone-acetaminophen (NORCO/VICODIN) 5-325 MG tablet Take 1 tablet by mouth every 6 (six) hours as needed for moderate pain or severe pain.  Marland Kitchen losartan (COZAAR) 50 MG tablet TAKE 1 TABLET BY MOUTH ONCE DAILY  . rosuvastatin (CRESTOR) 5 MG tablet Take 1 tablet by mouth daily.  Must make appt w/new provider for future refills  . SYNTHROID 88 MCG tablet TAKE 1 TABLET BY MOUTH ONCE DAILY BEFORE BREAKFAST  . Tiotropium Bromide-Olodaterol (STIOLTO RESPIMAT) 2.5-2.5 MCG/ACT AERS Inhale 2 puffs into the lungs daily.   No facility-administered encounter medications on file as of 09/25/2017.     Functional Status:  In your present state of health, do you have any difficulty performing the following activities: 09/25/2017 09/19/2017  Hearing? N N  Vision? N N  Difficulty concentrating or making decisions? N N  Walking or climbing stairs? Y Y  Comment - -  Dressing or bathing? N N  Doing errands, shopping? N N  Some recent data might be hidden    Fall/Depression Screening:  PHQ 2/9 Scores 09/25/2017  PHQ - 2 Score 1    Assessment: CSW received new referral on patient due to SNF placement. Patient was completely independent and lived alone before recent hospitalization on 09/16/17. Patient is a 71 year old female with ongoing tobacco use and known underlying severe COPD and recent diagnosis of breast cancer. CSW arrived at [facility] and met with patient in her room. Patient remembers meeting  with [facility] Liaison yesterday. CSW introduced self, reason for visit and of [place] social work services. Patient is agreeable to services. Patient is at [facility] for short term rehab. Patient reports that she has three children: 1 son who lives in town, 1 son who lives in [place] but in a different county and 1 daughter that lives in another state. Prior to recent hospitalization, patient was able to live alone and manage her health care needs herself. Patient was nervous about returning home and was agreeable to SNF stay. Patient is hopeful to be able to be independent again and admits having limited support. Patient denies wishing to compelte advance directives at this time. Patient denies having transportation issues stating that she has a car and also has a neighbor who will assist if ever needed. Patient reports living in a [employer]. Patient denies having any medical equipment at home. She shares that she has a couple of friends who assist her when needed. Patient reports that her son that lives in town has checked on her a lot since hospitalization and that daughter has contacted her several times by phone. CSW informed patient that [facility] Liaison made request for [place] involvement once she returns home from SNF and patient is agreeable to this referral. Patient denies wanting LTC placement. Patient shares that she is not eligible for Medicaid due to having a small pension. She shares that she has no one to assist her with her personal care needs once she returns home but is unsure as to whether she  will need someone or not. Patient reports having some signs of depression since her hospitalization but denies needing any mental health resources stating that her depression has more to do with her health care circumstances. Patient shares that she will now have to be on oxygen which is new for her. Patient started to experience some shortness of breath during end of session and called her  nurse. Breathing treatment was ordered.   [facility] CM Care Plan Problem One     Most Recent Value  Care Plan Problem One  SNF admission and ongoing health barriers  Role Documenting the Problem One  Clinical Social Worker  Care Plan for Problem One  Active  [facility] CM Short Term Goal #1   Patient will have a safe and stable discharge back home from SNF within 30 days  [facility] CM Short Term Goal #1 Start Date  09/25/17  Interventions for Short Term Goal #1  CSW completed SNF visit and met with patient. CSW will monitor patient's stay at SNF very closely and will coordinate with SNF staff and await discharge plans. CSW will ensure that all need resources, services and medical equipment are ordered as well.  [facility] CM Short Term Goal #2   Patient will be referred to a [facility] Pharmacist per [facility] request within 30 days  [facility] CM Short Term Goal #2 Start Date  09/25/17  Interventions for Short Term Goal #2  Patient is agreeable to Pharmacy involvement after SNF discharge and CSW will monitor case closely and await for SNF discharge before completing referral.     CSW went to SNF social worker's office but she was not available to meet. CSW completed call and left a voice message encouraging a return call and any future updates.   CSW completed call to patient's son Fannie Knee that lives in town. CSW was unable to reach him but left a HIPPA compliant voice message encouraging a return call once available.  Plan: CSW will route encounter to PCP and follow up within one week.  Eula Fried, BSW, MSW, [email or web address] Phone: [phone] Fax: [phone]

## 2017-09-26 ENCOUNTER — Encounter: Payer: Self-pay | Admitting: Internal Medicine

## 2017-09-26 ENCOUNTER — Non-Acute Institutional Stay (SKILLED_NURSING_FACILITY): Payer: Medicare HMO | Admitting: Internal Medicine

## 2017-09-26 DIAGNOSIS — J449 Chronic obstructive pulmonary disease, unspecified: Secondary | ICD-10-CM

## 2017-09-26 DIAGNOSIS — J9600 Acute respiratory failure, unspecified whether with hypoxia or hypercapnia: Secondary | ICD-10-CM | POA: Diagnosis not present

## 2017-09-26 DIAGNOSIS — I10 Essential (primary) hypertension: Secondary | ICD-10-CM | POA: Diagnosis not present

## 2017-09-26 DIAGNOSIS — Z9012 Acquired absence of left breast and nipple: Secondary | ICD-10-CM

## 2017-09-26 DIAGNOSIS — E440 Moderate protein-calorie malnutrition: Secondary | ICD-10-CM | POA: Diagnosis not present

## 2017-09-26 DIAGNOSIS — E46 Unspecified protein-calorie malnutrition: Secondary | ICD-10-CM | POA: Insufficient documentation

## 2017-09-26 DIAGNOSIS — G709 Myoneural disorder, unspecified: Secondary | ICD-10-CM | POA: Insufficient documentation

## 2017-09-26 DIAGNOSIS — F419 Anxiety disorder, unspecified: Secondary | ICD-10-CM | POA: Diagnosis not present

## 2017-09-26 NOTE — Assessment & Plan Note (Signed)
Need for caloric intake for postop healing discussed Nutrition consult for non-lactose-containing protein supplements Trial of Gaviscon chewables 3 times a day after meals

## 2017-09-26 NOTE — Assessment & Plan Note (Signed)
BP controlled; no change in antihypertensive medications  

## 2017-09-26 NOTE — Patient Instructions (Signed)
See assessment and plan under each diagnosis in the problem list and acutely for this visit 

## 2017-09-26 NOTE — Assessment & Plan Note (Signed)
Postop follow-up will be scheduled

## 2017-09-26 NOTE — Assessment & Plan Note (Signed)
Low-dose SSRI declined Ranitidine and Mylanta declined Patient agrees to trial of chewable Gaviscon postprandially

## 2017-09-26 NOTE — Progress Notes (Signed)
NURSING HOME LOCATION:  [facility] ROOM NUMBER:  307-A  CODE STATUS:  Full Code  PCP:  Golden Circle, FNP  [place] [address]   This is a comprehensive admission note to [facility] performed on this date less than 30 days from date of admission. Included are preadmission medical/surgical history;reconciled medication list; family history; social history and comprehensive review of systems.  Corrections and additions to the records were documented . Comprehensive physical exam was also performed. Additionally a clinical summary was entered for each active diagnosis pertinent to this admission in the Problem List to enhance continuity of care.  HPI: Patient was hospitalized 11/19-11/27/18 for left modified radical mastectomy for breast cancer by Dr Autumn Messing 11/19. Postoperatively she had respiratory failure 11/21 . She extubated herself approximately 8 hours later. This was in the context of underlying COPD and diagnosis of "neuromuscular disorder" (motor neuron disease). The most recent labs were 09/21/17. Albumin was 3.1 but stable. Anemia was improving, hemoglobin was 11.7. Indices were normochromic, normocytic. A1c was 6.2% on 10/30,  Past medical and surgical history: Includes hyperthyroidism, hypertension, dyslipidemia, anaphylactoid reaction to LABA/ inhaled steroid as throat swelling, vitamin D deficiency, prediabetes, history of peptic ulcer disease, GERD and difficulty with postanesthesia arousal. Surgeries include partial thyroidectomy and abdominal hysterectomy with culpotomy..  Social history: Chart states patient smoked approximately a fourth a pack a day ( she states up to 1 pack per day for ill-defined period) for over 5 decades; 1-2 cigarettes a day prior to hospitalization. Nondrinker. The patient has a PhD but is very defensive when asked about her education level. She makes the comment that "it makes people feel bad"(when I  talk about it).  Family history: Reviewed  Review of systems: She describes variable constipation and diarrhea with possible irritable bowel syndrome. Her major symptom is shortness of breath particularly if she ingests large amounts of food. It's worse when she lies flat. She treats this at home by not eating after mid afternoon.She will also get up and walk with improvement.. She has tried Zantac and Mylanta in the past with variable results. She describes anxiety with shortness of breath. This is manifested as some tremor and tachycardia.  Constitutional: No fever,significant weight change, fatigue  Eyes: No redness, discharge, pain, vision change ENT/mouth: No nasal congestion,  purulent discharge, earache,change in hearing ,sore throat  Cardiovascular: No chest pain, paroxysmal nocturnal dyspnea, claudication, edema  Respiratory: No cough, sputum production,hemoptysis, significant snoring,apnea  Gastrointestinal: No heartburn,dysphagia,abdominal pain, nausea / vomiting,rectal bleeding, melena Genitourinary: No dysuria,hematuria, pyuria,  incontinence, nocturia Musculoskeletal: No joint stiffness, joint swelling, weakness,pain Dermatologic: No rash, pruritus, change in appearance of skin Neurologic: No dizziness,headache,syncope, seizures, numbness , tingling Psychiatric: No significant depression, insomnia, anorexia Endocrine: No change in hair/skin/ nails, excessive thirst, excessive hunger, excessive urination  Hematologic/lymphatic: No significant bruising, lymphadenopathy,abnormal bleeding Allergy/immunology: No itchy/ watery eyes, significant sneezing, urticaria, angioedema  Physical exam:  Pertinent or positive findings: She is well spoken and articulate. She does appear cachectic with temporal wasting and limb atrophy. There is irregular vitiligo at the right anterior scalp line. Arcus senilis is present. There is slight exotropia of the right eye intermittently. She has complete  dentures. 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 Breath sounds are generally decreased. Clubbing of nailbeds noted.  General appearance:no acute distress , increased work of breathing is present.   Lymphatic: No lymphadenopathy about the head, neck, axilla . Eyes: No  conjunctival inflammation or lid edema is present. There is no scleral icterus. Ears:  External ear exam shows no significant lesions or deformities.   Nose:  External nasal examination shows no deformity or inflammation. Nasal mucosa are pink and moist without lesions ,exudates Oral exam: lips and gums are healthy appearing.There is no oropharyngeal erythema or exudate . Neck:  No thyromegaly, masses, tenderness noted.    Heart:  Normal rate and regular rhythm without gallop, murmur, click, rub .  Lungs: without wheezes, rhonchi,rales , rubs. Abdomen:Bowel sounds are normal. Abdomen is soft and nontender with no organomegaly, hernias,masses. GU: deferred  Extremities:  No cyanosis, edema  Skin: Warm & dry w/o tenting. No significant  rash.  See clinical summary under each active problem in the Problem List with associated updated therapeutic plan

## 2017-09-26 NOTE — Assessment & Plan Note (Signed)
Pulmonary follow-up will be scheduled

## 2017-09-26 NOTE — Assessment & Plan Note (Addendum)
Continue pulmonary toilet; risk of excess albuterol discussed with her. She describes shortness of breath despite documented O2 sats 95-100 percent with supplemental oxygen. Postprandial dyspnea , better with decreased intake & ambulation Variable response ranitidine and Mylanta. Trial of Gaviscon Pulmonary follow-up for COPD with Dr. Lamonte Sakai

## 2017-10-01 ENCOUNTER — Other Ambulatory Visit: Payer: Self-pay | Admitting: Licensed Clinical Social Worker

## 2017-10-01 NOTE — Patient Outreach (Signed)
[facility]) Care Management  10/01/2017  Kristin [name] April 11, 1946 [id number]  Assessment-CSW completed outreach attempt today to patient's son. CSW unable to reach son successfully. CSW left a HIPPA compliant voice message encouraging son to return call once available.  Plan-CSW will await return call from family. CSW will follow up with SNF next week.  Eula Fried, BSW, MSW, [email or web address] Phone: [phone] Fax: [phone]

## 2017-10-02 ENCOUNTER — Ambulatory Visit: Admission: RE | Admit: 2017-10-02 | Payer: Medicare HMO | Source: Ambulatory Visit

## 2017-10-02 ENCOUNTER — Ambulatory Visit
Admission: RE | Admit: 2017-10-02 | Discharge: 2017-10-02 | Disposition: A | Discharge status: Home / Self Care | Payer: Medicare HMO | Source: Ambulatory Visit | Attending: Radiation Oncology | Admitting: Radiation Oncology

## 2017-10-03 ENCOUNTER — Ambulatory Visit: Payer: Medicare HMO | Admitting: Podiatry

## 2017-10-03 ENCOUNTER — Encounter: Payer: Self-pay | Admitting: Adult Health

## 2017-10-03 ENCOUNTER — Non-Acute Institutional Stay (SKILLED_NURSING_FACILITY): Payer: Medicare HMO | Admitting: Adult Health

## 2017-10-03 DIAGNOSIS — E039 Hypothyroidism, unspecified: Secondary | ICD-10-CM | POA: Diagnosis not present

## 2017-10-03 DIAGNOSIS — C50512 Malignant neoplasm of lower-outer quadrant of left female breast: Secondary | ICD-10-CM

## 2017-10-03 DIAGNOSIS — R5381 Other malaise: Secondary | ICD-10-CM

## 2017-10-03 DIAGNOSIS — K5901 Slow transit constipation: Secondary | ICD-10-CM

## 2017-10-03 DIAGNOSIS — Z720 Tobacco use: Secondary | ICD-10-CM | POA: Diagnosis not present

## 2017-10-03 DIAGNOSIS — E782 Mixed hyperlipidemia: Secondary | ICD-10-CM | POA: Diagnosis not present

## 2017-10-03 DIAGNOSIS — I10 Essential (primary) hypertension: Secondary | ICD-10-CM | POA: Diagnosis not present

## 2017-10-03 DIAGNOSIS — J449 Chronic obstructive pulmonary disease, unspecified: Secondary | ICD-10-CM

## 2017-10-03 DIAGNOSIS — J9611 Chronic respiratory failure with hypoxia: Secondary | ICD-10-CM | POA: Diagnosis not present

## 2017-10-03 DIAGNOSIS — Z170 Estrogen receptor positive status [ER+]: Secondary | ICD-10-CM | POA: Diagnosis not present

## 2017-10-03 MED ORDER — TIOTROPIUM BROMIDE-OLODATEROL 2.5-2.5 MCG/ACT IN AERS: 2.0000 | INHALATION_SPRAY | Freq: Every day | RESPIRATORY_TRACT | 0 refills | Status: AC

## 2017-10-03 MED ORDER — HYDROCODONE-ACETAMINOPHEN 5-325 MG PO TABS: 1.0000 | ORAL_TABLET | Freq: Four times a day (QID) | ORAL | 0 refills | Status: AC | PRN

## 2017-10-03 MED ORDER — LOSARTAN POTASSIUM 50 MG PO TABS: 50.0000 mg | ORAL_TABLET | Freq: Every day | ORAL | 0 refills | Status: DC

## 2017-10-03 MED ORDER — ALBUTEROL SULFATE (2.5 MG/3ML) 0.083% IN NEBU: 2.5000 mg | INHALATION_SOLUTION | Freq: Four times a day (QID) | RESPIRATORY_TRACT | 0 refills | Status: AC | PRN

## 2017-10-03 MED ORDER — ROSUVASTATIN CALCIUM 5 MG PO TABS: ORAL_TABLET | ORAL | 0 refills | Status: DC

## 2017-10-03 MED ORDER — SYNTHROID 88 MCG PO TABS: 88.0000 ug | ORAL_TABLET | Freq: Every day | ORAL | 0 refills | Status: DC

## 2017-10-03 MED ORDER — HYDROCHLOROTHIAZIDE 25 MG PO TABS: ORAL_TABLET | ORAL | 0 refills | Status: DC

## 2017-10-03 MED ORDER — NICOTINE 7 MG/24HR TD PT24: 7.0000 mg | MEDICATED_PATCH | Freq: Every day | TRANSDERMAL | 0 refills | Status: AC

## 2017-10-03 NOTE — Progress Notes (Addendum)
Location:  [facility] Room Number: 307-A Place of Service:  SNF (31) Provider:  Durenda Age, NP  Patient Care Team: Golden Circle, FNP as PCP - General (Family Medicine) Jovita Kussmaul, MD as Consulting Physician (General Surgery) Truitt Merle, MD as Consulting Physician (Hematology) Alla Feeling, NP as Nurse Practitioner (Nurse Practitioner) Greg Cutter, LCSW as [place] Management (Licensed Clinical Social Worker) Ruedinger, Drexel Iha, [facility] as [place] Management (Pharmacist)  Extended Emergency Contact Information Primary Emergency Contact: [place] Phone: [phone] Relation: Son Secondary Emergency Contact: Jerry [place] of [place] Phone: [phone] Mobile Phone: [phone] Relation: Friend  Code Status:  Full Code Goals of care: Advanced Directive information Advanced Directives 10/03/2017  Does Patient Have a Medical Advance Directive? Yes  Type of Advance Directive Out of facility DNR (pink MOST or yellow form)  Does patient want to make changes to medical advance directive? No - Patient declined  Would patient like information on creating a medical advance directive? No - Patient declined     Chief Complaint  Patient presents with  . Discharge Note    Patient is being discharging to home on 10/04/17    HPI:  Pt is a 71 y.o. [name] seen today for a discharge visit.  She is discharging home on 10/04/17 with home health OT, PT, and Nursing services.    She has been admitted to [facility] on 09/25/17 from [facility] hospitalization dates 09/16/17-09/24/17 S/P left modified radical mastectomy on 09/16/17 for breast cancer of the lower outer quadrant of her left breast. Postop, she had complication of respiratory failure requiring mechanical ventilation on 09/18/17. She self extubated later on on same day. She  was seen in the room today and currently on 3L/min via [place]. She verbalized earlier that she is having SOB  But O2 sat was 98%. Chest x-ray was done today which showed no abnormality. She had duoneb treatment and verbalized relief of SOB. She said that this happens to her at home and she does nebulization and she feels better. She said that today Dr. Marlou Starks will discontinue her 2 JP drains attached to her left chest. She has a PMH of essential hypertension, ocular migraine, COPD, IBS, GERD, hypothyroidism, DDD of cervical spine, and HLD.    Past Medical History:  Diagnosis Date  . Acute sinusitis, unspecified   . Breast calcifications   . Breast cancer ([place])   . Cervicalgia   . Chest pain, unspecified   . Complication of anesthesia    hard time waking up - cant remember how long ago  . COPD (chronic obstructive pulmonary disease) ([place])   . Degeneration of cervical intervertebral disc   . Diverticulosis of colon (without mention of hemorrhage)   . Dyspnea   . Esophageal reflux   . Hyperlipidemia   . IBS (irritable bowel syndrome)   . Motor neuron disease ([place])   . Neuromuscular disorder ([place])    motor neuron disease  . Obstructive chronic bronchitis with exacerbation ([place])   . Personal history of peptic ulcer disease   . Pneumonia    hx  . Pre-diabetes    per patient  . Unspecified essential hypertension   . Unspecified hypothyroidism   . Unspecified vitamin D deficiency   . Vitiligo    Past Surgical History:  Procedure Laterality Date  . ABDOMINAL HYSTERECTOMY W/ PARTIAL VAGINACTOMY    . BREAST BIOPSY Right   .  BREAST EXCISIONAL BIOPSY Right 1998  . COLONOSCOPY  05/19/2012  . ESOPHAGOGASTRODUODENOSCOPY  01/03/2011   normal   . MASTECTOMY W/ SENTINEL NODE BIOPSY Left 09/16/2017   Procedure: LEFT MASTECTOMY WITH SENTINEL LYMPH NODE BIOPSY;  Surgeon: Jovita Kussmaul, MD;  Location: [facility];  Service: General;  Laterality: Left;  Marland Kitchen MULTIPLE TOOTH EXTRACTIONS    . THYROIDECTOMY,  PARTIAL    . TONSILLECTOMY      Allergies  Allergen Reactions  . Symbicort [Budesonide-Formoterol Fumarate] Anaphylaxis and Other (See Comments)    Throat swelling    Outpatient Encounter Medications as of 10/03/2017  Medication Sig  . albuterol (PROVENTIL) (2.5 MG/3ML) 0.083% nebulizer solution Take 3 mLs (2.5 mg total) by nebulization every 6 (six) hours as needed for wheezing or shortness of breath.  . hydrochlorothiazide (HYDRODIURIL) 25 MG tablet Take 1 tablet by mouth once a day. Must make appt w/new provider for future refills  . HYDROcodone-acetaminophen (NORCO/VICODIN) 5-325 MG tablet Take 1 tablet by mouth every 6 (six) hours as needed for moderate pain or severe pain.  Marland Kitchen losartan (COZAAR) 50 MG tablet Take 1 tablet (50 mg total) by mouth daily.  . nicotine (NICODERM CQ - DOSED IN MG/24 HR) 7 mg/24hr patch Place 1 patch (7 mg total) onto the skin daily for 14 days.  . rosuvastatin (CRESTOR) 5 MG tablet Take 1 tablet by mouth daily.  Must make appt w/new provider for future refills  . senna-docusate (SENOKOT-S) 8.6-50 MG tablet Take 2 tablets by mouth 2 (two) times daily.  Marland Kitchen SYNTHROID 88 MCG tablet Take 1 tablet (88 mcg total) by mouth daily before breakfast.  . Tiotropium Bromide-Olodaterol (STIOLTO RESPIMAT) 2.5-2.5 MCG/ACT AERS Inhale 2 puffs into the lungs daily.  . [DISCONTINUED] albuterol (PROVENTIL) (2.5 MG/3ML) 0.083% nebulizer solution Take 2.5 mg by nebulization every 6 (six) hours as needed for wheezing or shortness of breath.   . [DISCONTINUED] hydrochlorothiazide (HYDRODIURIL) 25 MG tablet Take 1 tablet by mouth once a day. Must make appt w/new provider for future refills  . [DISCONTINUED] HYDROcodone-acetaminophen (NORCO/VICODIN) 5-325 MG tablet Take 1 tablet by mouth every 6 (six) hours as needed for moderate pain or severe pain.  . [DISCONTINUED] losartan (COZAAR) 50 MG tablet TAKE 1 TABLET BY MOUTH ONCE DAILY  . [DISCONTINUED] nicotine (NICODERM CQ - DOSED IN MG/24  HR) 7 mg/24hr patch Place 7 mg onto the skin daily.  . [DISCONTINUED] rosuvastatin (CRESTOR) 5 MG tablet Take 1 tablet by mouth daily.  Must make appt w/new provider for future refills  . [DISCONTINUED] SYNTHROID 88 MCG tablet TAKE 1 TABLET BY MOUTH ONCE DAILY BEFORE BREAKFAST  . [DISCONTINUED] Tiotropium Bromide-Olodaterol (STIOLTO RESPIMAT) 2.5-2.5 MCG/ACT AERS Inhale 2 puffs into the lungs daily.   No facility-administered encounter medications on file as of 10/03/2017.     Review of Systems  GENERAL: No change in appetite, no fatigue, no weight changes, no fever, chills or weakness MOUTH and THROAT: Denies oral discomfort, gingival pain or bleeding RESPIRATORY: no cough, SOB, DOE, wheezing, hemoptysis CARDIAC: No chest pain, edema or palpitations GI: No abdominal pain, diarrhea, constipation, heart burn, nausea or vomiting GU: Denies dysuria, frequency, hematuria, incontinence, or discharge PSYCHIATRIC: Denies feelings of depression or anxiety. No report of hallucinations, insomnia, paranoia, or agitation   Immunization History  Administered Date(s) Administered  . Influenza Split 08/04/2016  . Influenza Whole 07/21/2008, 08/19/2009, 08/30/2010, 07/29/2012  . Influenza, High Dose Seasonal PF 06/28/2017  . Influenza,inj,Quad PF,6+ Mos 07/08/2015  . Pneumococcal Conjugate-13 10/07/2015  .  Pneumococcal Polysaccharide-23 10/30/2003  . Td 10/30/2003  . Tdap 06/28/2017  . Zoster 05/26/2008   Pertinent  Health Maintenance Due  Topic Date Due  . OPHTHALMOLOGY EXAM  07/10/2016  . PNA vac Low Risk Adult (2 of 2 - PPSV23) 10/06/2016  . FOOT EXAM  02/13/2017  . HEMOGLOBIN A1C  02/25/2018  . MAMMOGRAM  03/01/2019  . COLONOSCOPY  05/19/2022  . INFLUENZA VACCINE  Completed  . DEXA SCAN  Completed   Fall Risk  09/25/2017 08/12/2017 03/26/2017  Falls in the past year? No No No      Vitals:   10/03/17 1218  BP: 104/72  Pulse: 78  Resp: 18  Temp: (!) 97.1 F (36.2 C)  TempSrc:  Oral  SpO2: 98%  Weight: 102 lb 11.7 oz (46.6 kg)  Height: 5\' 6"  (1.676 m)   Body mass index is 16.58 kg/m.   Physical Exam  GENERAL APPEARANCE:  In no acute distress. SKIN:  Left chest surgical site has 2 tubings attached to JP drains, the one with dark blood has <1cc drainage and the other with serous (yellowish) has moderate 40 cc MOUTH and THROAT: Lips are without lesions. Oral mucosa is moist and without lesions. RESPIRATORY: Breathing is even & unlabored, BS CTAB CARDIAC: RRR, no murmur,no extra heart sounds, no edema GI: Abdomen soft, normal BS, no masses, no tenderness, no hepatomegaly, no splenomegaly EXTREMITIES:  Able to move X 4 extremities PSYCHIATRIC: Alert and oriented X 3. Affect and behavior are appropriate   Labs reviewed: Recent Labs    09/19/17 0223 09/20/17 0224 09/21/17 0233  NA 136 140 140  K 4.2 4.3 4.5  CL 101 100* 99*  CO2 29 34* 30  GLUCOSE 70 90 99  BUN 8 14 15   CREATININE 0.70 0.63 0.66  CALCIUM 8.0* 8.6* 8.9  MG 2.0 1.9 2.0  PHOS 3.3 2.8 2.8   Recent Labs    02/05/17 0920 05/31/17 0903 09/19/17 0223 09/20/17 0224 09/21/17 0233  AST 22 23  --   --   --   ALT 13 16  --   --   --   ALKPHOS 66 63  --   --   --   BILITOT 1.2 1.2  --   --   --   PROT 7.5 7.4  --   --   --   ALBUMIN 4.5 4.4 3.1* 3.1* 3.1*   Recent Labs    09/19/17 0223 09/20/17 0224 09/21/17 0233  WBC 6.0 4.7 4.9  HGB 10.7* 11.0* 11.7*  HCT 34.0* 34.8* 36.5  MCV 98.0 97.8 95.5  PLT 176 188 216   Lab Results  Component Value Date   TSH 1.227 09/17/2017   Lab Results  Component Value Date   HGBA1C 6.2 (H) 08/27/2017   Lab Results  Component Value Date   CHOL 132 05/31/2017   HDL 76.30 05/31/2017   LDLCALC 43 05/31/2017   TRIG 63.0 05/31/2017   CHOLHDL 2 05/31/2017    Significant Diagnostic Results in last 30 days:  Nm Sentinel Node Inj-no Rpt (breast)  Result Date: 09/16/2017 Sulfur colloid was injected by the nuclear medicine technologist for  melanoma sentinel node.   Dg Chest Port 1 View  Result Date: 09/19/2017 CLINICAL DATA:  Short of breath.  Ventilator dependent EXAM: PORTABLE CHEST 1 VIEW COMPARISON:  09/18/2017 FINDINGS: Endotracheal tube no longer visualized. New severe apical emphysema unchanged. Bibasilar airspace disease right greater than left with mild progression. Negative for effusion IMPRESSION: Bibasilar airspace disease  with mild progression. Endotracheal tube no longer visualized. Electronically Signed   By: Franchot Gallo M.D.   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 The endotracheal tube tip is proximally 6 cm above the level of the carina. The lungs are hyperexpanded. Cardiomediastinal contours are normal. IMPRESSION: Endotracheal tube tip greater than 5 cm above the inferior carina. COPD without acute airspace disease. Electronically Signed   By: Ulyses Jarred M.D.   On: 09/18/2017 03:40   Dg Abd Portable 1v  Result Date: 09/18/2017 CLINICAL DATA:  Orogastric tube placement EXAM: PORTABLE ABDOMEN - 1 VIEW COMPARISON:  CT abdomen and pelvis February 07, 2017 FINDINGS: Orogastric tube tip is in the proximal stomach with the side port at the gastroesophageal junction. There are loops of mildly dilated bowel in the upper abdomen. No free air evident. IMPRESSION: Orogastric tube tip in proximal stomach with side port at gastroesophageal junction. Advise advancing nasogastric tube 6-8 cm to confirm that both nasogastric tube tip and side port are within the stomach. Mild bowel dilatation. There may be a degree of underlying ileus. No free air evident. Electronically Signed   By: Lowella Grip III M.D.   On: 09/18/2017 07:19    Assessment/Plan  1. Physical deconditioning  - for Home health PT and OT, for therapeutic strengthening  exercises, fall precautions   2. 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 Marlou Starks and clarify when is the removal of her drains - HYDROcodone-acetaminophen (NORCO/VICODIN) 5-325 MG tablet; Take 1 tablet by mouth every 6 (six) hours as needed for moderate pain or severe pain.  Dispense: 30 tablet; Refill: 0  3. Essential hypertension -  well-controlled  - hydrochlorothiazide (HYDRODIURIL) 25 MG tablet; Take 1 tablet by mouth once a day. Must make appt w/new provider for future refills  Dispense: 30 tablet; Refill: 0 - losartan (COZAAR) 50 MG tablet; Take 1 tablet (50 mg total) by mouth daily.  Dispense: 30 tablet; Refill: 0  4. HYPERLIPIDEMIA, MIXED - rosuvastatin (CRESTOR) 5 MG tablet; Take 1 tablet by mouth daily.  Must make appt w/new provider for future refills  Dispense: 30 tablet; Refill: 0  5. Hypothyroidism, unspecified type Lab Results  Component Value Date   TSH 1.227 09/17/2017   - SYNTHROID 88 MCG tablet; Take 1 tablet (88 mcg total) by mouth daily before breakfast.  Dispense: 30 tablet; Refill: 0  6. Chronic respiratory failure with hypoxia (HCC) - will refer to pulmonology, continue O2 @ 3L/min via [place] continuously - albuterol (PROVENTIL) (2.5 MG/3ML) 0.083% nebulizer solution; Take 3 mLs (2.5 mg total) by nebulization every 6 (six) hours as needed for wheezing or shortness of breath.  Dispense: 75 mL; Refill: 0 - Tiotropium Bromide-Olodaterol (STIOLTO RESPIMAT) 2.5-2.5 MCG/ACT AERS; Inhale 2 puffs into the lungs daily.  Dispense: 1 Inhaler; Refill: 0  7. Slow transit constipation - continue senna S 2 tabs twice a day when necessary   8. COPD GOLD D - no SOB/wheezing - albuterol (PROVENTIL) (2.5 MG/3ML) 0.083% nebulizer solution; Take 3 mLs (2.5 mg total) by nebulization every 6 (six) hours as needed for wheezing or shortness of breath.  Dispense: 75 mL; Refill: 0 -  Tiotropium Bromide-Olodaterol (STIOLTO RESPIMAT) 2.5-2.5 MCG/ACT AERS; Inhale 2 puffs  into the lungs daily.  Dispense: 1 Inhaler; Refill: 0  9. Tobacco use - nicotine (NICODERM CQ - DOSED IN MG/24 HR) 7 mg/24hr patch; Place 1 patch (7 mg total) onto the skin daily for 14 days.  Dispense: 14 patch; Refill: 0     I have filled out patient's discharge paperwork and written prescriptions.  Patient will receive home health PT, OT, and Nursing.  DME provided: 3-in-1, rollator  Total discharge time: Greater than 30 minutes Greater than 50% was spent in counseling and coordination of care.   Discharge time involved coordination of the discharge process with social worker, nursing staff and therapy department. Medical justification for home health services/DME verified.    Durenda Age, NP [employer] [phone]

## 2017-10-07 ENCOUNTER — Other Ambulatory Visit: Payer: Self-pay | Admitting: Licensed Clinical Social Worker

## 2017-10-07 NOTE — Patient Outreach (Signed)
[facility]) Care Management  10/07/2017  Susanne Baumgarner 02/05/1946 [id number]  Assessment- CSW received return email from [facility]. CSW was informed that patient discharged back home on 10/04/17 per patient's decision. CSW completed outreach call to patient. Patient answered. She reports that she is still experiencing SOB but is happy to be back home and is completing her own breathing treatments. CSW advised her that a pharmacy would be contacting her for a 30 day post discharge medication reconciliation. Patient expressed understanding. Patient denies having any social work problems at this time. She states that has a very good friend that is staying with her. Patient is unable to answer anymore questions due to experiencing SOB. CSW will follow up at another time and will update Pharmacist.  Plan-CSW will follow up within one week and will ensure that patient has no further social work needs. CSW will update [facility] Pharmacist.  Eula Fried, BSW, MSW, [email or web address] Phone: [phone] Fax: [phone]

## 2017-10-07 NOTE — Patient Outreach (Signed)
[facility]) Care Management  10/07/2017  Maxie Debose 1946-04-20 [id number]  Assessment-CSW completed sent secure email to [place] requesting any updates on patient including any discharge information.   Plan-CSW will await return email or complete an additional outreach if needed.  Eula Fried, BSW, MSW, [email or web address] Phone: [phone] Fax: [phone]

## 2017-10-08 ENCOUNTER — Ambulatory Visit: Payer: Medicare HMO | Admitting: Emergency Medicine

## 2017-10-08 ENCOUNTER — Other Ambulatory Visit: Payer: Self-pay | Admitting: Pharmacist

## 2017-10-08 ENCOUNTER — Inpatient Hospital Stay: Admission: RE | Admit: 2017-10-08 | Payer: Medicare HMO | Source: Ambulatory Visit

## 2017-10-08 NOTE — Patient Outreach (Signed)
[facility]) Care Management  [place]   10/08/2017  Kristin [name] 1946-10-14 [id number]  Subjective:  Patient was referred to [place] Pharmacist for discharge medication review.  Patient was admitted at [facility] 11/19-11/27/18 for a mastectomy with respiratory distress post-operatively.  She was subsequently discharged to [facility] and reports she was discharged home on 10/04/17.    Patient has a past medical history significant for COPD, hypothyroidism, hyperlipidemia, hypertension.    Successful phone outreach to patient, HIPAA details verified and purpose of call explained to patient.  Patient reports she is able to review her medications over the phone.   She reports she does not use a pill box and denies adherence or cost concerns with her medications.    She reports she used to see Toniann Ket, NP and Shelba Flake for primary care and has an appointment with Joelene Millin, NP with [facility] next week.  She reports she may try to continue seeing a PCP at [facility].    Objective:   Current Medications: Current Outpatient Medications  Medication Sig Dispense Refill  . albuterol (PROVENTIL) (2.5 MG/3ML) 0.083% nebulizer solution Take 3 mLs (2.5 mg total) by nebulization every 6 (six) hours as needed for wheezing or shortness of breath. 75 mL 0  . hydrochlorothiazide (HYDRODIURIL) 25 MG tablet Take 1 tablet by mouth once a day. Must make appt w/new provider for future refills 30 tablet 0  . HYDROcodone-acetaminophen (NORCO/VICODIN) 5-325 MG tablet Take 1 tablet by mouth every 6 (six) hours as needed for moderate pain or severe pain. 30 tablet 0  . losartan (COZAAR) 50 MG tablet Take 1 tablet (50 mg total) by mouth daily. 30 tablet 0  . nicotine (NICODERM CQ - DOSED IN MG/24 HR) 7 mg/24hr patch Place 1 patch (7 mg total) onto the skin daily for 14 days. 14 patch 0  . rosuvastatin (CRESTOR) 5 MG tablet Take 1 tablet by mouth daily.  Must  make appt w/new provider for future refills 30 tablet 0  . senna-docusate (SENOKOT-S) 8.6-50 MG tablet Take 2 tablets by mouth 2 (two) times daily.    Marland Kitchen SYNTHROID 88 MCG tablet Take 1 tablet (88 mcg total) by mouth daily before breakfast. 30 tablet 0  . Tiotropium Bromide-Olodaterol (STIOLTO RESPIMAT) 2.5-2.5 MCG/ACT AERS Inhale 2 puffs into the lungs daily. 1 Inhaler 0   No current facility-administered medications for this visit.     Functional Status: In your present state of health, do you have any difficulty performing the following activities: 09/25/2017 09/19/2017  Hearing? N N  Vision? N N  Difficulty concentrating or making decisions? N N  Walking or climbing stairs? Y Y  Comment - -  Dressing or bathing? N N  Doing errands, shopping? N N  Some recent data might be hidden    Fall/Depression Screening: Fall Risk  09/25/2017 08/12/2017 03/26/2017  Falls in the past year? No No No   PHQ 2/9 Scores 09/25/2017  PHQ - 2 Score 1    ASSESSMENT: Date Discharged from Hospital: 09/24/17 and [facility] 10/04/17 Date Medication Reconciliation Performed: 10/08/2017  Medications Discontinued at Discharge:  none  New Medications at Discharge:   Hydrocodone/acetaminophen 5/235 mg---patient reports she did not get filled yet  Nicotine patch---patient reports she did not get yet   Patient was recently discharged from hospital and all medications have been reviewed  Drugs sorted by system:  Cardiovascular: -hydrochlorothiazide  -losartan  -rosuvastatin   Pulmonary/Allergy: -albuterol nebs  as needed -tiotropium/olodaterol Museum/gallery exhibitions officer)  Gastrointestinal: -sennosides/docusate---patient reports not using at this time  Endocrine: -levothyroxine   Pain: -hydrocodone/acetaminophen as needed  Other issues noted:  -Patient counseled nicotine patches may not be covered on her insurance and she may need to get over-the-counter at her pharmacy of choice.    PLAN: Patient to  take new medications as prescribed by her providers.   Will route this note to Joelene Millin, NP with [facility] given upcoming appointment with her.   Pharmacy episode closed as patient denies other pharmacy related needs.   Karrie Meres, PharmD, [place] [phone]

## 2017-10-10 DIAGNOSIS — M6281 Muscle weakness (generalized): Secondary | ICD-10-CM | POA: Diagnosis not present

## 2017-10-10 DIAGNOSIS — Z9981 Dependence on supplemental oxygen: Secondary | ICD-10-CM | POA: Diagnosis not present

## 2017-10-10 DIAGNOSIS — Z9012 Acquired absence of left breast and nipple: Secondary | ICD-10-CM | POA: Diagnosis not present

## 2017-10-10 DIAGNOSIS — Z439 Encounter for attention to unspecified artificial opening: Secondary | ICD-10-CM | POA: Diagnosis not present

## 2017-10-10 DIAGNOSIS — S90822D Blister (nonthermal), left foot, subsequent encounter: Secondary | ICD-10-CM | POA: Diagnosis not present

## 2017-10-10 DIAGNOSIS — I10 Essential (primary) hypertension: Secondary | ICD-10-CM | POA: Diagnosis not present

## 2017-10-10 DIAGNOSIS — J449 Chronic obstructive pulmonary disease, unspecified: Secondary | ICD-10-CM | POA: Diagnosis not present

## 2017-10-10 DIAGNOSIS — K5710 Diverticulosis of small intestine without perforation or abscess without bleeding: Secondary | ICD-10-CM | POA: Diagnosis not present

## 2017-10-10 DIAGNOSIS — C50512 Malignant neoplasm of lower-outer quadrant of left female breast: Secondary | ICD-10-CM | POA: Diagnosis not present

## 2017-10-11 ENCOUNTER — Telehealth: Payer: Self-pay | Admitting: Emergency Medicine

## 2017-10-11 DIAGNOSIS — Z9012 Acquired absence of left breast and nipple: Secondary | ICD-10-CM | POA: Diagnosis not present

## 2017-10-11 DIAGNOSIS — M6281 Muscle weakness (generalized): Secondary | ICD-10-CM | POA: Diagnosis not present

## 2017-10-11 DIAGNOSIS — I10 Essential (primary) hypertension: Secondary | ICD-10-CM | POA: Diagnosis not present

## 2017-10-11 DIAGNOSIS — J449 Chronic obstructive pulmonary disease, unspecified: Secondary | ICD-10-CM

## 2017-10-11 DIAGNOSIS — C50512 Malignant neoplasm of lower-outer quadrant of left female breast: Secondary | ICD-10-CM | POA: Diagnosis not present

## 2017-10-11 DIAGNOSIS — K5710 Diverticulosis of small intestine without perforation or abscess without bleeding: Secondary | ICD-10-CM | POA: Diagnosis not present

## 2017-10-11 DIAGNOSIS — Z9981 Dependence on supplemental oxygen: Secondary | ICD-10-CM | POA: Diagnosis not present

## 2017-10-11 DIAGNOSIS — S90822D Blister (nonthermal), left foot, subsequent encounter: Secondary | ICD-10-CM | POA: Diagnosis not present

## 2017-10-11 DIAGNOSIS — J9611 Chronic respiratory failure with hypoxia: Secondary | ICD-10-CM

## 2017-10-11 DIAGNOSIS — Z439 Encounter for attention to unspecified artificial opening: Secondary | ICD-10-CM | POA: Diagnosis not present

## 2017-10-11 NOTE — Telephone Encounter (Signed)
Spoke with pt, she states her pharmacy stated she filled her Stiolto so she is unable to receive a Rx from the pharmacy under her insurance. She asked for a sample to get through until she can refill it again. Sample placed up front, nothing further is needed.

## 2017-10-12 DIAGNOSIS — K5710 Diverticulosis of small intestine without perforation or abscess without bleeding: Secondary | ICD-10-CM | POA: Diagnosis not present

## 2017-10-12 DIAGNOSIS — Z439 Encounter for attention to unspecified artificial opening: Secondary | ICD-10-CM | POA: Diagnosis not present

## 2017-10-12 DIAGNOSIS — C50512 Malignant neoplasm of lower-outer quadrant of left female breast: Secondary | ICD-10-CM | POA: Diagnosis not present

## 2017-10-12 DIAGNOSIS — M6281 Muscle weakness (generalized): Secondary | ICD-10-CM | POA: Diagnosis not present

## 2017-10-12 DIAGNOSIS — S90822D Blister (nonthermal), left foot, subsequent encounter: Secondary | ICD-10-CM | POA: Diagnosis not present

## 2017-10-12 DIAGNOSIS — J449 Chronic obstructive pulmonary disease, unspecified: Secondary | ICD-10-CM | POA: Diagnosis not present

## 2017-10-12 DIAGNOSIS — Z9981 Dependence on supplemental oxygen: Secondary | ICD-10-CM | POA: Diagnosis not present

## 2017-10-12 DIAGNOSIS — Z9012 Acquired absence of left breast and nipple: Secondary | ICD-10-CM | POA: Diagnosis not present

## 2017-10-12 DIAGNOSIS — I10 Essential (primary) hypertension: Secondary | ICD-10-CM | POA: Diagnosis not present

## 2017-10-14 DIAGNOSIS — J449 Chronic obstructive pulmonary disease, unspecified: Secondary | ICD-10-CM | POA: Diagnosis not present

## 2017-10-14 DIAGNOSIS — I10 Essential (primary) hypertension: Secondary | ICD-10-CM | POA: Diagnosis not present

## 2017-10-14 DIAGNOSIS — Z9012 Acquired absence of left breast and nipple: Secondary | ICD-10-CM | POA: Diagnosis not present

## 2017-10-14 DIAGNOSIS — M6281 Muscle weakness (generalized): Secondary | ICD-10-CM | POA: Diagnosis not present

## 2017-10-14 DIAGNOSIS — K5710 Diverticulosis of small intestine without perforation or abscess without bleeding: Secondary | ICD-10-CM | POA: Diagnosis not present

## 2017-10-14 DIAGNOSIS — C50512 Malignant neoplasm of lower-outer quadrant of left female breast: Secondary | ICD-10-CM | POA: Diagnosis not present

## 2017-10-14 DIAGNOSIS — S90822D Blister (nonthermal), left foot, subsequent encounter: Secondary | ICD-10-CM | POA: Diagnosis not present

## 2017-10-14 DIAGNOSIS — Z9981 Dependence on supplemental oxygen: Secondary | ICD-10-CM | POA: Diagnosis not present

## 2017-10-14 DIAGNOSIS — Z439 Encounter for attention to unspecified artificial opening: Secondary | ICD-10-CM | POA: Diagnosis not present

## 2017-10-15 DIAGNOSIS — M6281 Muscle weakness (generalized): Secondary | ICD-10-CM | POA: Diagnosis not present

## 2017-10-15 DIAGNOSIS — Z439 Encounter for attention to unspecified artificial opening: Secondary | ICD-10-CM | POA: Diagnosis not present

## 2017-10-15 DIAGNOSIS — Z9981 Dependence on supplemental oxygen: Secondary | ICD-10-CM | POA: Diagnosis not present

## 2017-10-15 DIAGNOSIS — I10 Essential (primary) hypertension: Secondary | ICD-10-CM | POA: Diagnosis not present

## 2017-10-15 DIAGNOSIS — Z9012 Acquired absence of left breast and nipple: Secondary | ICD-10-CM | POA: Diagnosis not present

## 2017-10-15 DIAGNOSIS — K5710 Diverticulosis of small intestine without perforation or abscess without bleeding: Secondary | ICD-10-CM | POA: Diagnosis not present

## 2017-10-15 DIAGNOSIS — S90822D Blister (nonthermal), left foot, subsequent encounter: Secondary | ICD-10-CM | POA: Diagnosis not present

## 2017-10-15 DIAGNOSIS — C50512 Malignant neoplasm of lower-outer quadrant of left female breast: Secondary | ICD-10-CM | POA: Diagnosis not present

## 2017-10-15 DIAGNOSIS — J449 Chronic obstructive pulmonary disease, unspecified: Secondary | ICD-10-CM | POA: Diagnosis not present

## 2017-10-16 ENCOUNTER — Ambulatory Visit: Payer: Medicare HMO | Admitting: Family

## 2017-10-16 ENCOUNTER — Encounter: Payer: Self-pay | Admitting: Family

## 2017-10-16 ENCOUNTER — Other Ambulatory Visit: Payer: Self-pay | Admitting: Licensed Clinical Social Worker

## 2017-10-16 ENCOUNTER — Ambulatory Visit: Payer: Self-pay | Admitting: Nurse Practitioner

## 2017-10-16 DIAGNOSIS — I10 Essential (primary) hypertension: Secondary | ICD-10-CM

## 2017-10-16 DIAGNOSIS — E039 Hypothyroidism, unspecified: Secondary | ICD-10-CM

## 2017-10-16 DIAGNOSIS — E782 Mixed hyperlipidemia: Secondary | ICD-10-CM | POA: Diagnosis not present

## 2017-10-16 MED ORDER — LOSARTAN POTASSIUM 25 MG PO TABS: 25.0000 mg | ORAL_TABLET | Freq: Every day | ORAL | 0 refills | Status: AC

## 2017-10-16 MED ORDER — SYNTHROID 88 MCG PO TABS: 88.0000 ug | ORAL_TABLET | Freq: Every day | ORAL | 0 refills | Status: AC

## 2017-10-16 MED ORDER — HYDROCHLOROTHIAZIDE 25 MG PO TABS: ORAL_TABLET | ORAL | 0 refills | Status: AC

## 2017-10-16 MED ORDER — ROSUVASTATIN CALCIUM 5 MG PO TABS: ORAL_TABLET | ORAL | 0 refills | Status: AC

## 2017-10-16 NOTE — Patient Outreach (Addendum)
[facility]) Care Management  10/16/2017  Kerry Odonohue May 30, 1946 [id number]  Assessment-CSW completed outreach attempt today and was able to successfully reach patient. Patient answered and provided HIPPA verifications. Patient reports to be doing well since discharge back home from SNF. She reports that she can finally tell that she is starting to get better. Patient continues to deal with ongoing SOB but found that when she is experiencing SOB and someone rubs her back in circular motions that it helps to soothe her and to allow her to breath easier. Patient reports that her friend is still staying with her "some" and assisted her with her needs. Patient reports that she has talked to [facility] Pharmacist and completed medication reconciliation. Patient denies any further care management needs. She denies any further social work needs and is agreeable to case closure.   [facility] CM Care Plan Problem One     Most Recent Value  Care Plan Problem One  SNF admission and ongoing health barriers  Role Documenting the Problem One  Clinical Social Worker  Care Plan for Problem One  Active  [facility] CM Short Term Goal #1   Patient will have a safe and stable discharge back home from SNF within 30 days  [facility] CM Short Term Goal #1 Start Date  09/25/17  [facility] CM Short Term Goal #1 Met Date  10/16/17  Interventions for Short Term Goal #1  GOAL MET.   [facility] CM Short Term Goal #2   Patient will be referred to a [facility] Pharmacist per [place] request within 30 days  [facility] CM Short Term Goal #2 Start Date  09/25/17  [facility] CM Short Term Goal #2 Met Date  10/16/17  Interventions for Short Term Goal #2  GOAL COMPLETED     Plan-CSW will complete case closure at this time. CSW will update PCP and [facility] Care Management Assistant.   Eula Fried, BSW, MSW, [email or web address] Phone: [phone] Fax: [phone]

## 2017-10-16 NOTE — Progress Notes (Signed)
Kristin [name] is a 71 y.o. female with the following history as recorded in EpicCare:  Patient Active Problem List   Diagnosis Date Noted  . Acute respiratory failure ([place]) 09/26/2017  . Protein-calorie malnutrition ([place]) 09/26/2017  . Anxiety 09/26/2017  . Neuromuscular disorder ([place])   . S/P left mastectomy 09/16/2017  . Breast cancer of lower-outer quadrant of left female breast ([place]) 09/08/2017  . Encounter for general adult medical examination with abnormal findings 05/31/2017  . Medicare annual wellness visit, initial 05/31/2017  . Pulmonary nodule 04/02/2017  . Upper abdominal pain 08/09/2016  . SOB (shortness of breath) 08/09/2016  . Upper respiratory infection 12/13/2015  . Generalized abdominal pain 01/28/2015  . Abdominal bloating 01/28/2015  . Neck pain 03/29/2014  . Smoker 03/20/2013  . EARLY SATIETY 12/19/2010  . DYSPHAGIA UNSPECIFIED 12/19/2010  . Dysphagia, unspecified 12/19/2010  . FLATULENCE-GAS-BLOATING 12/19/2010  . PERIPHERAL NEUROPATHY 02/21/2010  . FOOT PAIN, BILATERAL 02/21/2010  . WEIGHT LOSS, ABNORMAL 12/22/2009  . Type 2 diabetes mellitus, controlled ([place]) 11/11/2009  . HYPERLIPIDEMIA, MIXED 08/19/2009  . Allergic rhinitis 08/19/2009  . VITILIGO 05/18/2009  . DEGENERATIVE DISC DISEASE, CERVICAL SPINE 01/16/2009  . NECK PAIN, LEFT 01/06/2009  . DYSPHAGIA PHARYNGEAL PHASE 11/18/2008  . ABDOMINAL PAIN-GENERALIZED 06/18/2008  . UNSPECIFIED VITAMIN D DEFICIENCY 05/26/2008  . GERD 05/26/2008  . PUD, HX OF 05/26/2008  . SINUSITIS, ACUTE 03/24/2008  . MYALGIA 02/09/2008  . FATIGUE 02/09/2008  . CHEST PAIN UNSPECIFIED 02/09/2008  . COPD GOLD D 05/22/2007  . BRONCHITIS, OBSTRUCTIVE CHRONIC W/EXACRB 04/24/2007  . Hypothyroidism 03/17/2007  . OCULAR MIGRAINE 03/17/2007  . Essential hypertension 03/17/2007    Current Outpatient Medications  Medication Sig Dispense Refill  . albuterol (PROVENTIL) (2.5 MG/3ML) 0.083% nebulizer solution Take 3 mLs (2.5  mg total) by nebulization every 6 (six) hours as needed for wheezing or shortness of breath. 75 mL 0  . alum hydroxide-mag trisilicate (GAVISCON) 81-82 MG CHEW chewable tablet Chew 1 tablet by mouth 3 (three) times daily after meals.    . hydrochlorothiazide (HYDRODIURIL) 25 MG tablet Take 1 tablet by mouth once a day. 90 tablet 0  . HYDROcodone-acetaminophen (NORCO/VICODIN) 5-325 MG tablet Take 1 tablet by mouth every 6 (six) hours as needed for moderate pain or severe pain. 30 tablet 0  . ipratropium-albuterol (DUONEB) 0.5-2.5 (3) MG/3ML SOLN Take 3 mLs by nebulization every 6 (six) hours as needed.    Marland Kitchen losartan (COZAAR) 25 MG tablet Take 1 tablet (25 mg total) by mouth daily. 90 tablet 0  . nicotine (NICODERM CQ - DOSED IN MG/24 HR) 7 mg/24hr patch Place 1 patch (7 mg total) onto the skin daily for 14 days. 14 patch 0  . OXYGEN Inhale 3 L into the lungs.    . rosuvastatin (CRESTOR) 5 MG tablet Take 1 tablet by mouth daily. 90 tablet 0  . senna-docusate (SENOKOT-S) 8.6-50 MG tablet Take 2 tablets by mouth 2 (two) times daily.    Marland Kitchen SYNTHROID 88 MCG tablet Take 1 tablet (88 mcg total) by mouth daily before breakfast. 90 tablet 0  . Tiotropium Bromide-Olodaterol (STIOLTO RESPIMAT) 2.5-2.5 MCG/ACT AERS Inhale 2 puffs into the lungs daily. 1 Inhaler 0   No current facility-administered medications for this visit.     Allergies: Symbicort [budesonide-formoterol fumarate]  Past Medical History:  Diagnosis Date  . Acute sinusitis, unspecified   . Breast calcifications   . Breast cancer ([place])   . Cervicalgia   . Chest pain, unspecified   . Complication of anesthesia  hard time waking up - cant remember how long ago  . COPD (chronic obstructive pulmonary disease) ([place])   . Degeneration of cervical intervertebral disc   . Diverticulosis of colon (without mention of hemorrhage)   . Dyspnea   . Esophageal reflux   . Hyperlipidemia   . IBS (irritable bowel syndrome)   . 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 Neuromuscular disorder ([place])    motor neuron disease  . Obstructive chronic bronchitis with exacerbation ([place])   . Personal history of peptic ulcer disease   . Pneumonia    hx  . Pre-diabetes    per patient  . Unspecified essential hypertension   . Unspecified hypothyroidism   . Unspecified vitamin D deficiency   . Vitiligo     Past Surgical History:  Procedure Laterality Date  . ABDOMINAL HYSTERECTOMY W/ PARTIAL VAGINACTOMY    . BREAST BIOPSY Right   . BREAST EXCISIONAL BIOPSY Right 1998  . COLONOSCOPY  05/19/2012  . ESOPHAGOGASTRODUODENOSCOPY  01/03/2011   normal   . MASTECTOMY W/ SENTINEL NODE BIOPSY Left 09/16/2017   Procedure: LEFT MASTECTOMY WITH SENTINEL LYMPH NODE BIOPSY;  Surgeon: Jovita Kussmaul, MD;  Location: [place];  Service: General;  Laterality: Left;  Marland Kitchen MULTIPLE TOOTH EXTRACTIONS    . THYROIDECTOMY, PARTIAL    . TONSILLECTOMY      Family History  Problem Relation Age of Onset  . Diabetes Mother        everyone on both sides  . Hypertension Mother        everyone on both sides  . Heart attack Mother   . Heart attack Maternal Grandmother   . Diabetes Maternal Grandmother   . Hypertension Maternal Grandmother   . Diabetes Maternal Grandfather   . Hypertension Maternal Grandfather   . Diabetes Paternal Grandmother   . Hypertension Paternal Grandmother   . Diabetes Paternal Grandfather   . Hypertension Paternal Grandfather   . Cancer Cousin 35       father's first cousin; breast and ovarian  . Colon cancer Neg Hx   . Stomach cancer Neg Hx   . Esophageal cancer Neg Hx   . Rectal cancer Neg Hx     Social History   Tobacco Use  . Smoking status: Former Smoker    Packs/day: 0.25    Years: 53.00    Pack years: 13.25    Types: Cigarettes  . Smokeless tobacco: Never Used  Substance Use Topics  . Alcohol use: No    Alcohol/week: 0.0 oz    Subjective:  Patient presents to follow-up on her chronic care needs: 1) Hypertension; 2)  Hyperlipidemia; 3) Hypothyroidism; Is recovering from mastectomy and recent stay at nursing home due to "physical deconditioning." Has been home since 12/7; notes she is now having to adjust to being on daily oxygen- 3L and is under care of pulmonology; needs to get her prescriptions updated until she can establish with her new PCP at this office.  She does have home health checking on her regularly and notes that they have mentioned her blood pressure being very low. She admits that she is not sure if she is feeling bad due to her blood pressure or "everything she has been through." Uses walker for mobility and admits she just doesn't feel very strong.   Objective:  Vitals:   10/16/17 0920  BP: (!) 110/50  Pulse: 83  Temp: 98.2 F (36.8 C)  TempSrc: Oral  SpO2: 96%  Height: 5\' 6"  (1.676  m)    General: Well developed, well nourished, in no acute distress  Skin : Warm and dry.  Head: Normocephalic and atraumatic  Neck: Supple without thyromegaly, adenopathy  Lungs: Respirations unlabored; clear to auscultation bilaterally without wheeze, rales, rhonchi  CVS exam: normal rate and regular rhythm.  Neurologic: Alert and oriented; speech intact; face symmetrical; moves all extremities well; CNII-XII intact without focal deficit   Assessment:  1. Essential hypertension   2. HYPERLIPIDEMIA, MIXED   3. Hypothyroidism, unspecified type     Plan:  1. Concern for hypotension; will cut Losartan to 25 mg daily; continue HCTZ 25 mg daily; as weight returns to normal of 120 lbs, may need to increase dosage again; she defers labs today but will establish with her new PCP next month; she will have home health call if her blood pressure becomes to high with changes; 2. Refill on Crestor 5 mg; lipids to be done at next visit as she defers labs today; 3. Refill on Synthroid 88 (brand); check labs at next OV;  Keep planned follow-up with pulmonology for 11/07/17; try to see her new PCP here on that same  day, sooner prn. No Follow-up on file.  No orders of the defined types were placed in this encounter.   Requested Prescriptions   Signed Prescriptions Disp Refills  . hydrochlorothiazide (HYDRODIURIL) 25 MG tablet 90 tablet 0    Sig: Take 1 tablet by mouth once a day.  . rosuvastatin (CRESTOR) 5 MG tablet 90 tablet 0    Sig: Take 1 tablet by mouth daily.  Marland Kitchen losartan (COZAAR) 25 MG tablet 90 tablet 0    Sig: Take 1 tablet (25 mg total) by mouth daily.  Marland Kitchen SYNTHROID 88 MCG tablet 90 tablet 0    Sig: Take 1 tablet (88 mcg total) by mouth daily before breakfast.

## 2017-10-16 NOTE — Patient Instructions (Signed)
Please schedule your follow-up with Charlsie Quest, NP on November 07, 2017

## 2017-10-18 ENCOUNTER — Telehealth: Payer: Self-pay | Admitting: Internal Medicine

## 2017-10-18 DIAGNOSIS — Z439 Encounter for attention to unspecified artificial opening: Secondary | ICD-10-CM | POA: Diagnosis not present

## 2017-10-18 DIAGNOSIS — J449 Chronic obstructive pulmonary disease, unspecified: Secondary | ICD-10-CM | POA: Diagnosis not present

## 2017-10-18 DIAGNOSIS — Z9981 Dependence on supplemental oxygen: Secondary | ICD-10-CM | POA: Diagnosis not present

## 2017-10-18 DIAGNOSIS — S90822D Blister (nonthermal), left foot, subsequent encounter: Secondary | ICD-10-CM | POA: Diagnosis not present

## 2017-10-18 DIAGNOSIS — M6281 Muscle weakness (generalized): Secondary | ICD-10-CM | POA: Diagnosis not present

## 2017-10-18 DIAGNOSIS — I10 Essential (primary) hypertension: Secondary | ICD-10-CM | POA: Diagnosis not present

## 2017-10-18 DIAGNOSIS — C50512 Malignant neoplasm of lower-outer quadrant of left female breast: Secondary | ICD-10-CM | POA: Diagnosis not present

## 2017-10-18 DIAGNOSIS — K5710 Diverticulosis of small intestine without perforation or abscess without bleeding: Secondary | ICD-10-CM | POA: Diagnosis not present

## 2017-10-18 DIAGNOSIS — Z9012 Acquired absence of left breast and nipple: Secondary | ICD-10-CM | POA: Diagnosis not present

## 2017-10-18 NOTE — Telephone Encounter (Signed)
Spoke with patient and advised of message below from [place]. Also called home health and told them to call back in regards.

## 2017-10-18 NOTE — Telephone Encounter (Signed)
Attempted to contact Aurea Graff, RN for [facility],regarding previous report to MD on pt's BP;  per Jodi Mourning "please ask her to just hold the Losartan for now; ask home health to call back with readings next week'' left message on voice mail at [phone]

## 2017-10-18 NOTE — Telephone Encounter (Signed)
Please ask her to just hold the Losartan for now; ask home health to call back with readings next week;

## 2017-10-18 NOTE — Telephone Encounter (Signed)
Langley Gauss RN from [facility] home [facility] called to notify MD of pt'sBP today 112/64 at 1245; used a manual cuff on right arm; this was on her lower dose of losartan 25 mg; meds are now in the home and will start them; will route to [facility] pool for notification

## 2017-10-18 NOTE — Telephone Encounter (Signed)
Kristin [name], you saw this patient recently for her blood pressure on 10/16/17. I did not know if you had any recommendation for this patient. She is scheduled to see Ashleigh for a follow up on 11/13/17. Please advise.

## 2017-10-21 DIAGNOSIS — C50512 Malignant neoplasm of lower-outer quadrant of left female breast: Secondary | ICD-10-CM | POA: Diagnosis not present

## 2017-10-21 DIAGNOSIS — S90822D Blister (nonthermal), left foot, subsequent encounter: Secondary | ICD-10-CM | POA: Diagnosis not present

## 2017-10-21 DIAGNOSIS — Z9981 Dependence on supplemental oxygen: Secondary | ICD-10-CM | POA: Diagnosis not present

## 2017-10-21 DIAGNOSIS — Z439 Encounter for attention to unspecified artificial opening: Secondary | ICD-10-CM | POA: Diagnosis not present

## 2017-10-21 DIAGNOSIS — J449 Chronic obstructive pulmonary disease, unspecified: Secondary | ICD-10-CM | POA: Diagnosis not present

## 2017-10-21 DIAGNOSIS — M6281 Muscle weakness (generalized): Secondary | ICD-10-CM | POA: Diagnosis not present

## 2017-10-21 DIAGNOSIS — I10 Essential (primary) hypertension: Secondary | ICD-10-CM | POA: Diagnosis not present

## 2017-10-21 DIAGNOSIS — Z9012 Acquired absence of left breast and nipple: Secondary | ICD-10-CM | POA: Diagnosis not present

## 2017-10-21 DIAGNOSIS — K5710 Diverticulosis of small intestine without perforation or abscess without bleeding: Secondary | ICD-10-CM | POA: Diagnosis not present

## 2017-10-23 ENCOUNTER — Telehealth: Payer: Self-pay | Admitting: Emergency Medicine

## 2017-10-23 DIAGNOSIS — J449 Chronic obstructive pulmonary disease, unspecified: Secondary | ICD-10-CM | POA: Diagnosis not present

## 2017-10-23 DIAGNOSIS — C50512 Malignant neoplasm of lower-outer quadrant of left female breast: Secondary | ICD-10-CM | POA: Diagnosis not present

## 2017-10-23 DIAGNOSIS — M6281 Muscle weakness (generalized): Secondary | ICD-10-CM | POA: Diagnosis not present

## 2017-10-23 DIAGNOSIS — I10 Essential (primary) hypertension: Secondary | ICD-10-CM | POA: Diagnosis not present

## 2017-10-23 DIAGNOSIS — Z9981 Dependence on supplemental oxygen: Secondary | ICD-10-CM | POA: Diagnosis not present

## 2017-10-23 DIAGNOSIS — S90822D Blister (nonthermal), left foot, subsequent encounter: Secondary | ICD-10-CM | POA: Diagnosis not present

## 2017-10-23 DIAGNOSIS — Z9012 Acquired absence of left breast and nipple: Secondary | ICD-10-CM | POA: Diagnosis not present

## 2017-10-23 DIAGNOSIS — Z439 Encounter for attention to unspecified artificial opening: Secondary | ICD-10-CM | POA: Diagnosis not present

## 2017-10-23 DIAGNOSIS — K5710 Diverticulosis of small intestine without perforation or abscess without bleeding: Secondary | ICD-10-CM | POA: Diagnosis not present

## 2017-10-23 NOTE — Telephone Encounter (Signed)
Verbalized to pt that it is okay for her to continue using the nicotine patches especially if it is helping her not to smoke. Pt verbalized to me that she has not smoked since she was in the hospital. Nothing further needed.

## 2017-10-24 DIAGNOSIS — M6281 Muscle weakness (generalized): Secondary | ICD-10-CM | POA: Diagnosis not present

## 2017-10-24 DIAGNOSIS — J449 Chronic obstructive pulmonary disease, unspecified: Secondary | ICD-10-CM | POA: Diagnosis not present

## 2017-10-24 DIAGNOSIS — K5710 Diverticulosis of small intestine without perforation or abscess without bleeding: Secondary | ICD-10-CM | POA: Diagnosis not present

## 2017-10-24 DIAGNOSIS — Z439 Encounter for attention to unspecified artificial opening: Secondary | ICD-10-CM | POA: Diagnosis not present

## 2017-10-24 DIAGNOSIS — I10 Essential (primary) hypertension: Secondary | ICD-10-CM | POA: Diagnosis not present

## 2017-10-24 DIAGNOSIS — S90822D Blister (nonthermal), left foot, subsequent encounter: Secondary | ICD-10-CM | POA: Diagnosis not present

## 2017-10-24 DIAGNOSIS — Z9981 Dependence on supplemental oxygen: Secondary | ICD-10-CM | POA: Diagnosis not present

## 2017-10-24 DIAGNOSIS — C50512 Malignant neoplasm of lower-outer quadrant of left female breast: Secondary | ICD-10-CM | POA: Diagnosis not present

## 2017-10-24 DIAGNOSIS — Z9012 Acquired absence of left breast and nipple: Secondary | ICD-10-CM | POA: Diagnosis not present

## 2017-10-25 DIAGNOSIS — J449 Chronic obstructive pulmonary disease, unspecified: Secondary | ICD-10-CM | POA: Diagnosis not present

## 2017-10-25 DIAGNOSIS — Z9012 Acquired absence of left breast and nipple: Secondary | ICD-10-CM | POA: Diagnosis not present

## 2017-10-25 DIAGNOSIS — C50512 Malignant neoplasm of lower-outer quadrant of left female breast: Secondary | ICD-10-CM | POA: Diagnosis not present

## 2017-10-25 DIAGNOSIS — M6281 Muscle weakness (generalized): Secondary | ICD-10-CM | POA: Diagnosis not present

## 2017-10-25 DIAGNOSIS — I10 Essential (primary) hypertension: Secondary | ICD-10-CM | POA: Diagnosis not present

## 2017-10-25 DIAGNOSIS — Z9981 Dependence on supplemental oxygen: Secondary | ICD-10-CM | POA: Diagnosis not present

## 2017-10-25 DIAGNOSIS — K5710 Diverticulosis of small intestine without perforation or abscess without bleeding: Secondary | ICD-10-CM | POA: Diagnosis not present

## 2017-10-25 DIAGNOSIS — S90822D Blister (nonthermal), left foot, subsequent encounter: Secondary | ICD-10-CM | POA: Diagnosis not present

## 2017-10-25 DIAGNOSIS — Z439 Encounter for attention to unspecified artificial opening: Secondary | ICD-10-CM | POA: Diagnosis not present

## 2017-10-28 DIAGNOSIS — M6281 Muscle weakness (generalized): Secondary | ICD-10-CM | POA: Diagnosis not present

## 2017-10-28 DIAGNOSIS — S90822D Blister (nonthermal), left foot, subsequent encounter: Secondary | ICD-10-CM | POA: Diagnosis not present

## 2017-10-28 DIAGNOSIS — J449 Chronic obstructive pulmonary disease, unspecified: Secondary | ICD-10-CM | POA: Diagnosis not present

## 2017-10-28 DIAGNOSIS — Z9012 Acquired absence of left breast and nipple: Secondary | ICD-10-CM | POA: Diagnosis not present

## 2017-10-28 DIAGNOSIS — K5710 Diverticulosis of small intestine without perforation or abscess without bleeding: Secondary | ICD-10-CM | POA: Diagnosis not present

## 2017-10-28 DIAGNOSIS — Z9981 Dependence on supplemental oxygen: Secondary | ICD-10-CM | POA: Diagnosis not present

## 2017-10-28 DIAGNOSIS — C50512 Malignant neoplasm of lower-outer quadrant of left female breast: Secondary | ICD-10-CM | POA: Diagnosis not present

## 2017-10-28 DIAGNOSIS — Z439 Encounter for attention to unspecified artificial opening: Secondary | ICD-10-CM | POA: Diagnosis not present

## 2017-10-28 DIAGNOSIS — I10 Essential (primary) hypertension: Secondary | ICD-10-CM | POA: Diagnosis not present

## 2017-10-30 DIAGNOSIS — C50512 Malignant neoplasm of lower-outer quadrant of left female breast: Secondary | ICD-10-CM | POA: Diagnosis not present

## 2017-10-30 DIAGNOSIS — M6281 Muscle weakness (generalized): Secondary | ICD-10-CM | POA: Diagnosis not present

## 2017-10-30 DIAGNOSIS — Z439 Encounter for attention to unspecified artificial opening: Secondary | ICD-10-CM | POA: Diagnosis not present

## 2017-10-30 DIAGNOSIS — J449 Chronic obstructive pulmonary disease, unspecified: Secondary | ICD-10-CM | POA: Diagnosis not present

## 2017-10-30 DIAGNOSIS — Z9012 Acquired absence of left breast and nipple: Secondary | ICD-10-CM | POA: Diagnosis not present

## 2017-10-30 DIAGNOSIS — S90822D Blister (nonthermal), left foot, subsequent encounter: Secondary | ICD-10-CM | POA: Diagnosis not present

## 2017-10-30 DIAGNOSIS — Z9981 Dependence on supplemental oxygen: Secondary | ICD-10-CM | POA: Diagnosis not present

## 2017-10-30 DIAGNOSIS — I10 Essential (primary) hypertension: Secondary | ICD-10-CM | POA: Diagnosis not present

## 2017-10-30 DIAGNOSIS — K5710 Diverticulosis of small intestine without perforation or abscess without bleeding: Secondary | ICD-10-CM | POA: Diagnosis not present

## 2017-10-31 ENCOUNTER — Telehealth: Payer: Self-pay | Admitting: Oncology

## 2017-10-31 ENCOUNTER — Ambulatory Visit: Payer: Medicare HMO

## 2017-10-31 ENCOUNTER — Telehealth: Payer: Self-pay | Admitting: *Deleted

## 2017-10-31 ENCOUNTER — Ambulatory Visit
Admission: RE | Admit: 2017-10-31 | Discharge: 2017-10-31 | Disposition: A | Discharge status: Home / Self Care | Payer: Medicare HMO | Source: Ambulatory Visit | Attending: Radiation Oncology | Admitting: Radiation Oncology

## 2017-10-31 DIAGNOSIS — C50512 Malignant neoplasm of lower-outer quadrant of left female breast: Secondary | ICD-10-CM | POA: Diagnosis not present

## 2017-10-31 DIAGNOSIS — I10 Essential (primary) hypertension: Secondary | ICD-10-CM | POA: Diagnosis not present

## 2017-10-31 DIAGNOSIS — M6281 Muscle weakness (generalized): Secondary | ICD-10-CM | POA: Diagnosis not present

## 2017-10-31 DIAGNOSIS — Z439 Encounter for attention to unspecified artificial opening: Secondary | ICD-10-CM | POA: Diagnosis not present

## 2017-10-31 DIAGNOSIS — Z9012 Acquired absence of left breast and nipple: Secondary | ICD-10-CM | POA: Diagnosis not present

## 2017-10-31 DIAGNOSIS — J449 Chronic obstructive pulmonary disease, unspecified: Secondary | ICD-10-CM | POA: Diagnosis not present

## 2017-10-31 DIAGNOSIS — K5710 Diverticulosis of small intestine without perforation or abscess without bleeding: Secondary | ICD-10-CM | POA: Diagnosis not present

## 2017-10-31 DIAGNOSIS — Z9981 Dependence on supplemental oxygen: Secondary | ICD-10-CM | POA: Diagnosis not present

## 2017-10-31 DIAGNOSIS — S90822D Blister (nonthermal), left foot, subsequent encounter: Secondary | ICD-10-CM | POA: Diagnosis not present

## 2017-10-31 NOTE — Telephone Encounter (Signed)
Attempted to call patient regarding her consult appointment.  There was no answer/voicemail.

## 2017-10-31 NOTE — Telephone Encounter (Signed)
Received Physician Orders in Error from [facility]; forwarded to [place] for scan/email/SLS 01/03

## 2017-11-01 DIAGNOSIS — I10 Essential (primary) hypertension: Secondary | ICD-10-CM | POA: Diagnosis not present

## 2017-11-01 DIAGNOSIS — Z9012 Acquired absence of left breast and nipple: Secondary | ICD-10-CM | POA: Diagnosis not present

## 2017-11-01 DIAGNOSIS — Z9981 Dependence on supplemental oxygen: Secondary | ICD-10-CM | POA: Diagnosis not present

## 2017-11-01 DIAGNOSIS — C50512 Malignant neoplasm of lower-outer quadrant of left female breast: Secondary | ICD-10-CM | POA: Diagnosis not present

## 2017-11-01 DIAGNOSIS — J449 Chronic obstructive pulmonary disease, unspecified: Secondary | ICD-10-CM | POA: Diagnosis not present

## 2017-11-01 DIAGNOSIS — S90822D Blister (nonthermal), left foot, subsequent encounter: Secondary | ICD-10-CM | POA: Diagnosis not present

## 2017-11-01 DIAGNOSIS — Z439 Encounter for attention to unspecified artificial opening: Secondary | ICD-10-CM | POA: Diagnosis not present

## 2017-11-01 DIAGNOSIS — K5710 Diverticulosis of small intestine without perforation or abscess without bleeding: Secondary | ICD-10-CM | POA: Diagnosis not present

## 2017-11-01 DIAGNOSIS — M6281 Muscle weakness (generalized): Secondary | ICD-10-CM | POA: Diagnosis not present

## 2017-11-04 DIAGNOSIS — M6281 Muscle weakness (generalized): Secondary | ICD-10-CM | POA: Diagnosis not present

## 2017-11-04 DIAGNOSIS — J9611 Chronic respiratory failure with hypoxia: Secondary | ICD-10-CM | POA: Diagnosis not present

## 2017-11-04 DIAGNOSIS — R1310 Dysphagia, unspecified: Secondary | ICD-10-CM | POA: Diagnosis not present

## 2017-11-04 DIAGNOSIS — R0602 Shortness of breath: Secondary | ICD-10-CM | POA: Diagnosis not present

## 2017-11-04 DIAGNOSIS — C50512 Malignant neoplasm of lower-outer quadrant of left female breast: Secondary | ICD-10-CM | POA: Diagnosis not present

## 2017-11-04 DIAGNOSIS — Z9981 Dependence on supplemental oxygen: Secondary | ICD-10-CM | POA: Diagnosis not present

## 2017-11-04 DIAGNOSIS — S90822D Blister (nonthermal), left foot, subsequent encounter: Secondary | ICD-10-CM | POA: Diagnosis not present

## 2017-11-04 DIAGNOSIS — Z439 Encounter for attention to unspecified artificial opening: Secondary | ICD-10-CM | POA: Diagnosis not present

## 2017-11-04 DIAGNOSIS — Z9012 Acquired absence of left breast and nipple: Secondary | ICD-10-CM | POA: Diagnosis not present

## 2017-11-04 DIAGNOSIS — I10 Essential (primary) hypertension: Secondary | ICD-10-CM | POA: Diagnosis not present

## 2017-11-04 DIAGNOSIS — J449 Chronic obstructive pulmonary disease, unspecified: Secondary | ICD-10-CM | POA: Diagnosis not present

## 2017-11-04 DIAGNOSIS — J439 Emphysema, unspecified: Secondary | ICD-10-CM | POA: Diagnosis not present

## 2017-11-04 DIAGNOSIS — K5710 Diverticulosis of small intestine without perforation or abscess without bleeding: Secondary | ICD-10-CM | POA: Diagnosis not present

## 2017-11-05 DIAGNOSIS — K5710 Diverticulosis of small intestine without perforation or abscess without bleeding: Secondary | ICD-10-CM | POA: Diagnosis not present

## 2017-11-05 DIAGNOSIS — C50512 Malignant neoplasm of lower-outer quadrant of left female breast: Secondary | ICD-10-CM | POA: Diagnosis not present

## 2017-11-05 DIAGNOSIS — Z9012 Acquired absence of left breast and nipple: Secondary | ICD-10-CM | POA: Diagnosis not present

## 2017-11-05 DIAGNOSIS — I10 Essential (primary) hypertension: Secondary | ICD-10-CM | POA: Diagnosis not present

## 2017-11-05 DIAGNOSIS — M6281 Muscle weakness (generalized): Secondary | ICD-10-CM | POA: Diagnosis not present

## 2017-11-05 DIAGNOSIS — S90822D Blister (nonthermal), left foot, subsequent encounter: Secondary | ICD-10-CM | POA: Diagnosis not present

## 2017-11-05 DIAGNOSIS — Z9981 Dependence on supplemental oxygen: Secondary | ICD-10-CM | POA: Diagnosis not present

## 2017-11-05 DIAGNOSIS — J449 Chronic obstructive pulmonary disease, unspecified: Secondary | ICD-10-CM | POA: Diagnosis not present

## 2017-11-05 DIAGNOSIS — Z439 Encounter for attention to unspecified artificial opening: Secondary | ICD-10-CM | POA: Diagnosis not present

## 2017-11-07 ENCOUNTER — Ambulatory Visit: Payer: Medicare HMO | Admitting: Emergency Medicine

## 2017-11-07 ENCOUNTER — Encounter: Payer: Self-pay | Admitting: Emergency Medicine

## 2017-11-07 DIAGNOSIS — R911 Solitary pulmonary nodule: Secondary | ICD-10-CM

## 2017-11-07 DIAGNOSIS — R6889 Other general symptoms and signs: Secondary | ICD-10-CM | POA: Diagnosis not present

## 2017-11-07 DIAGNOSIS — J449 Chronic obstructive pulmonary disease, unspecified: Secondary | ICD-10-CM | POA: Diagnosis not present

## 2017-11-07 NOTE — Progress Notes (Signed)
  Subjective:    Patient ID: Kristin [name], female    DOB: 1946/01/19  MRN: [id number]   Brief patient profile:  62 yobf smoker With emphysema by CT scan, severe obstruction on pulmonary function testing FEV1 0.84 (40%)  ROV 04/03/27 --  72 year old woman, active smoker with a history of COPD, chronic cough with chronic upper airway irritation syndrome. Also with a history of irritable bowel syndrome. She was seen approximate one month ago with increased abdominal discomfort and what appear to be associated worsening dyspnea. She had run out of her Combivent at that time. She was given Symbicort temporarily until her Combivent to be refilled. She returns now for follow-up. Chest x-ray on 5/14 showed severe bullous emphysema without infiltrates. There was a suspected nodular density projecting in the left lower lobe of unclear etiology and a CT chest was recommended. She is back on combivent 1 puff, now qid. Her breathing is worst at night, difficulty laying flat. Her IBS affects her breathing. Smokes about 1 cig a day. Uses albuterol rarely as long as she has her combivent.   ROV 06/28/17 -- follow-up visit for patient with tobacco use, severe obstruction and COPD, chronic cough. Also with a suspected left lower lobe nodule by chest x-ray. We performed a CT scan of the chest on 04/05/17 that I have reviewed, confirms a 6 mm lingular nodule. She tells me that she has been having a lot of abdominal pain. She has been on stiolto, uses albuterol about 2x a day. She is off combivent. Minimal cough. Weight has been stable ~90 lbs. No fevers.   ROV 11/07/17 --follow-up visit for severe COPD, tobacco use, chronic cough.  She also has a 6 mm lingular nodule. Since last visit she has been dx with breast CA, underwent l mastectomy. She has been quite weak. She is supposed to see Dr Sondra Come for XRT but was snowed out in early January. She quit smoking in November!. She is on Stiolto, but has only been using prn.  Difficulty getting to her f/u CT scan due to transportation. Breathing and cough unchanged, stable right now.      Objective:   Vitals:   11/07/17 0947  BP: 122/60  Pulse: 87  SpO2: 99%  Height: 5\' 6"  (1.676 m)   Gen: Pleasant, thin woman, in no distress,  normal affect  ENT: No lesions,  mouth clear,  oropharynx clear, no postnasal drip  Neck: No JVD, no TMG, no carotid bruits  Lungs: No use of accessory muscles, Very distant, clear without rales or rhonchi  Cardiovascular: RRR, heart sounds normal, no murmur or gallops, no peripheral edema  Musculoskeletal: No deformities, no cyanosis or clubbing  Neuro: alert, non focal  Skin: Warm, some tightness that is suggestive of possible scleroderma.     Assessment & Plan:  COPD GOLD D Breathing has been stable but she has not been using Stiolto on a schedule.  I believe that she can benefit if we start taking it correctly.  Discussed this with her today.  She will continue to use DuoNeb as needed.  Pulmonary nodule 6 mm lingular nodule.  She needs CT scan follow-up, had difficulty attending the visit due to transportation.  We will try to reschedule.  Baltazar Apo, MD, PhD 11/07/2017, 10:37 AM [facility] [phone] or if no answer [phone]

## 2017-11-07 NOTE — Assessment & Plan Note (Signed)
6 mm lingular nodule.  She needs CT scan follow-up, had difficulty attending the visit due to transportation.  We will try to reschedule.

## 2017-11-07 NOTE — Patient Instructions (Signed)
Please start taking your Stiolto every day, once a day Keep your DuoNeb available to use as needed for shortness of breath We will re-order your CT scan of the chest to follow your pulmonary nodule.  CONGRATULATIONS on stopping smoking! Follow with Dr Lamonte Sakai in 2 months or sooner if you have any problems.

## 2017-11-07 NOTE — Assessment & Plan Note (Signed)
Breathing has been stable but she has not been using Stiolto on a schedule.  I believe that she can benefit if we start taking it correctly.  Discussed this with her today.  She will continue to use DuoNeb as needed.

## 2017-11-08 ENCOUNTER — Other Ambulatory Visit: Payer: Self-pay | Admitting: Nurse Practitioner

## 2017-11-08 DIAGNOSIS — C50512 Malignant neoplasm of lower-outer quadrant of left female breast: Secondary | ICD-10-CM | POA: Diagnosis not present

## 2017-11-08 DIAGNOSIS — Z9012 Acquired absence of left breast and nipple: Secondary | ICD-10-CM | POA: Diagnosis not present

## 2017-11-08 DIAGNOSIS — J449 Chronic obstructive pulmonary disease, unspecified: Secondary | ICD-10-CM | POA: Diagnosis not present

## 2017-11-08 DIAGNOSIS — K5710 Diverticulosis of small intestine without perforation or abscess without bleeding: Secondary | ICD-10-CM | POA: Diagnosis not present

## 2017-11-08 DIAGNOSIS — M6281 Muscle weakness (generalized): Secondary | ICD-10-CM | POA: Diagnosis not present

## 2017-11-08 DIAGNOSIS — S90822D Blister (nonthermal), left foot, subsequent encounter: Secondary | ICD-10-CM | POA: Diagnosis not present

## 2017-11-08 DIAGNOSIS — Z170 Estrogen receptor positive status [ER+]: Principal | ICD-10-CM

## 2017-11-08 DIAGNOSIS — Z439 Encounter for attention to unspecified artificial opening: Secondary | ICD-10-CM | POA: Diagnosis not present

## 2017-11-08 DIAGNOSIS — Z9981 Dependence on supplemental oxygen: Secondary | ICD-10-CM | POA: Diagnosis not present

## 2017-11-08 DIAGNOSIS — I10 Essential (primary) hypertension: Secondary | ICD-10-CM | POA: Diagnosis not present

## 2017-11-10 NOTE — Progress Notes (Deleted)
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 [facility]) Staging form: Breast, AJCC 8th Edition - Clinical stage from 08/29/2017: Stage Unknown (cT1c, cNX, cM0, ER: Positive, PR: Positive, HER2: Negative) - Signed by Alla Feeling, NP on 09/09/2017 - Pathologic stage from 09/16/2017: Stage IA (pT1c, pN1a, cM0, G2, ER: Positive, PR: Positive, HER2: Negative) - Signed by Alla Feeling, NP on 11/11/2017        Breast cancer of lower-outer quadrant of left female breast ([place])   08/29/2017 Mammogram    Diagnostic mammogram 08/29/17 Irregular hypoechoic mass abutting the skin and the chest wall measuring 1.3 x 0.9 x 1.1 cm at the left breast 4 o'clock 3 cm from nipple. Ultrasound of the left axilla demonstrate several abnormal thickened cortex left axillary lymph nodes.      08/29/2017 Initial Biopsy    Diagnosis 08/29/17 Breast, left, needle core biopsy, 4:00 o'clock mass - INVASIVE DUCTAL CARCINOMA WITH ASSOCIATED CALCIFICATIONS. - DUCTAL CARCINOMA IN SITU. - SEE COMMENT.      08/29/2017 Receptors her2    Estrogen Receptor: 100%, POSITIVE, STRONG STAINING INTENSITY Progesterone Receptor: 70%, POSITIVE, STRONG STAINING INTENSITY Proliferation Marker Ki67: 10% HER2: NEGATIVE      08/29/2017 Initial Diagnosis    Breast cancer of lower-outer quadrant of left female breast ([place])      09/16/2017 Surgery    LEFT MASTECTOMY WITH DEEP LEFT AXILLARY SENTINEL LYMPH NODE BIOPSY AND LEFT AXILLARY LYMPH NODE  DISSECTION per Dr. Marlou Starks        09/16/2017 Pathology Results    Diagnosis 1. Breast, simple mastectomy, left - INVASIVE DUCTAL CARCINOMA WITH CALCIFICATIONS, GRADE II/III, SPANNING 1.2 CM. - DUCTAL CARCINOMA IN SITU, LOW GRADE. - LYMPHOVASCULAR INVASION IS IDENTIFIED, DIFFUSE. - INVASIVE CARCINOMA IS FOCALLY PRESENT AT THE ANTERIOR SOFT TISSUE RESECTION MARGIN. - SEE ONCOLOGY TABLE BELOW. 2. Lymph node, sentinel, biopsy, left - THERE IS NO EVIDENCE OF CARCINOMA IN 1 OF 1 LYMPH NODE (0/1). 3. Lymph node, sentinel, biopsy, left - THERE IS NO EVIDENCE OF CARCINOMA IN 1 OF 1 LYMPH NODE (0/1). 4. Lymph node, sentinel, biopsy, left - THERE IS NO EVIDENCE OF CARCINOMA IN 1 OF 1 LYMPH NODE (0/1). 5. Lymph node, sentinel, biopsy, left - METASTATIC CARCINOMA IN 1 OF 1 LYMPH NODE (1/1). 6. Lymph node, sentinel, biopsy, left - THERE IS NO EVIDENCE OF CARCINOMA IN 1 OF 1 LYMPH NODE (0/1). 7. Lymph node, sentinel, biopsy, left - THERE IS NO EVIDENCE OF CARCINOMA IN 1 OF 1 LYMPH NODE (0/1). 8. Lymph node, sentinel, biopsy, left - THERE IS NO EVIDENCE OF CARCINOMA IN 1 OF 1 LYMPH NODE (0/1). 9. Lymph node, sentinel, biopsy, left - THERE IS NO EVIDENCE OF CARCINOMA IN 1 OF 1 LYMPH NODE (0/1). 10. Lymph node, sentinel, biopsy, left - THERE IS NO EVIDENCE OF CARCINOMA IN 1 OF 1 LYMPH NODE (0/1). Microscopic Comment 1. BREAST, INVASIVE TUMOR Procedure: Simple mastectomy and axillary lymph node resections. Laterality: Left. Tumor Size: 1.2 cm (gross measurement). Histologic Type: Ductal. Grade: II. Tubular Differentiation: 3. Nuclear Pleomorphism: 2. Mitotic Count: 1.  Ductal Carcinoma in Situ (DCIS): Present, low grade. Extent of Tumor: Confined to breast parenchyma. Margins: Invasive carcinoma, distance from closest margin: Focally present at the anterior soft tissue resection margin. DCIS, distance from closest margin: Greater than 0.2 cm to all margins. Regional Lymph Nodes: Number of  Lymph Nodes Examined: 9. Number of Sentinel Lymph Nodes Examined: 9. Lymph Nodes with Macrometastases: 1. Lymph Nodes with Micrometastases: 0. Lymph Nodes with Isolated Tumor Cells: 0. Breast Prognostic Profile: [id number]. Estrogen Receptor: 100%, strong. Progesterone Receptor: 70%, strong. Her2: No amplification was detected. The ratio was 1.05. Ki-67: 10%. Best tumor block for sendout testing: 1A Pathologic Stage Classification (pTNM, AJCC 8th Edition): Primary Tumor (pT): pT1c. Regional Lymph Nodes (pN): pN1a. Distant Metastases (pM): pMX       INTERVAL HISTORY: Please see below for problem oriented charting.  REVIEW OF SYSTEMS:   Constitutional: Denies fevers, chills or abnormal weight loss Eyes: Denies blurriness of vision Ears, nose, mouth, throat, and face: Denies mucositis or sore throat Respiratory: Denies cough, dyspnea or wheezes Cardiovascular: Denies palpitation, chest discomfort or lower extremity swelling Gastrointestinal:  Denies nausea, heartburn or change in bowel habits Skin: Denies abnormal skin rashes Lymphatics: Denies new lymphadenopathy or easy bruising Neurological:Denies numbness, tingling or new weaknesses Behavioral/Psych: Mood is stable, no new changes  All other systems were reviewed with the patient and are negative.  MEDICAL HISTORY:  Past Medical History:  Diagnosis Date  . Acute sinusitis, unspecified   . Breast calcifications   . Breast cancer ([place])   . Cervicalgia   . Chest pain, unspecified   . Complication of anesthesia    hard time waking up - cant remember how long ago  . COPD (chronic obstructive pulmonary disease) ([place])   . Degeneration of cervical intervertebral disc   . Diverticulosis of colon (without mention of hemorrhage)   . Dyspnea   . Esophageal reflux   . Hyperlipidemia   . IBS (irritable bowel syndrome)   . 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 Neuromuscular disorder ([place])    motor neuron disease  . Obstructive  chronic bronchitis with exacerbation ([place])   . Personal history of peptic ulcer disease   . Pneumonia    hx  . Pre-diabetes    per patient  . Unspecified essential hypertension   . Unspecified hypothyroidism   . Unspecified vitamin D deficiency   . Vitiligo     SURGICAL HISTORY: Past Surgical History:  Procedure Laterality Date  . ABDOMINAL HYSTERECTOMY W/ PARTIAL VAGINACTOMY    . BREAST BIOPSY Right   . BREAST EXCISIONAL BIOPSY Right 1998  . COLONOSCOPY  05/19/2012  . ESOPHAGOGASTRODUODENOSCOPY  01/03/2011   normal   . MASTECTOMY W/ SENTINEL NODE BIOPSY Left 09/16/2017   Procedure: LEFT MASTECTOMY WITH SENTINEL LYMPH NODE BIOPSY;  Surgeon: Jovita Kussmaul, MD;  Location: [facility];  Service: General;  Laterality: Left;  Marland Kitchen MULTIPLE TOOTH EXTRACTIONS    . THYROIDECTOMY, PARTIAL    . TONSILLECTOMY      I have reviewed the social history and family history with the patient and they are unchanged from previous note.  ALLERGIES:  is allergic to symbicort [budesonide-formoterol fumarate].  MEDICATIONS:  Current Outpatient Medications  Medication Sig Dispense Refill  . albuterol (PROVENTIL) (2.5 MG/3ML) 0.083% nebulizer solution Take 3 mLs (2.5 mg total) by nebulization every 6 (six) hours as needed for wheezing or shortness of breath. 75 mL 0  . alum hydroxide-mag trisilicate (GAVISCON) 15-05 MG CHEW chewable tablet Chew 1 tablet by  mouth 3 (three) times daily after meals.    . hydrochlorothiazide (HYDRODIURIL) 25 MG tablet Take 1 tablet by mouth once a day. 90 tablet 0  . HYDROcodone-acetaminophen (NORCO/VICODIN) 5-325 MG tablet Take 1 tablet by mouth every 6 (six) hours as needed for moderate pain or severe pain. 30 tablet 0  . ipratropium-albuterol (DUONEB) 0.5-2.5 (3) MG/3ML SOLN Take 3 mLs by nebulization every 6 (six) hours as needed.    Marland Kitchen losartan (COZAAR) 25 MG tablet Take 1 tablet (25 mg total) by mouth daily. (Patient not taking: Reported on 11/07/2017) 90 tablet 0  . OXYGEN  Inhale 3 L into the lungs.    . rosuvastatin (CRESTOR) 5 MG tablet Take 1 tablet by mouth daily. 90 tablet 0  . senna-docusate (SENOKOT-S) 8.6-50 MG tablet Take 2 tablets by mouth 2 (two) times daily.    Marland Kitchen SYNTHROID 88 MCG tablet Take 1 tablet (88 mcg total) by mouth daily before breakfast. 90 tablet 0  . Tiotropium Bromide-Olodaterol (STIOLTO RESPIMAT) 2.5-2.5 MCG/ACT AERS Inhale 2 puffs into the lungs daily.     No current facility-administered medications for this visit.     PHYSICAL EXAMINATION: ECOG PERFORMANCE STATUS: {CHL ONC ECOG [phone]}  There were no vitals filed for this visit. There were no vitals filed for this visit.  GENERAL:alert, no distress and comfortable SKIN: skin color, texture, turgor are normal, no rashes or significant lesions EYES: normal, Conjunctiva are pink and non-injected, sclera clear OROPHARYNX:no exudate, no erythema and lips, buccal mucosa, and tongue normal  NECK: supple, thyroid normal size, non-tender, without nodularity LYMPH:  no palpable lymphadenopathy in the cervical, axillary or inguinal LUNGS: clear to auscultation and percussion with normal breathing effort HEART: regular rate & rhythm and no murmurs and no lower extremity edema ABDOMEN:abdomen soft, non-tender and normal bowel sounds Musculoskeletal:no cyanosis of digits and no clubbing  NEURO: alert & oriented x 3 with fluent speech, no focal motor/sensory deficits  LABORATORY DATA:  I have reviewed the data as listed CBC Latest Ref Rng & Units 09/21/2017 09/20/2017 09/19/2017  WBC 4.0 - 10.5 K/uL 4.9 4.7 6.0  Hemoglobin 12.0 - 15.0 g/dL 11.7(L) 11.0(L) 10.7(L)  Hematocrit 36.0 - 46.0 % 36.5 34.8(L) 34.0(L)  Platelets 150 - 400 K/uL 216 188 176     CMP Latest Ref Rng & Units 09/21/2017 09/20/2017 09/19/2017  Glucose 65 - 99 mg/dL 99 90 70  BUN 6 - 20 mg/dL 15 14 8   Creatinine 0.44 - 1.00 mg/dL 0.66 0.63 0.70  Sodium 135 - 145 mmol/L 140 140 136  Potassium 3.5 - 5.1  mmol/L 4.5 4.3 4.2  Chloride 101 - 111 mmol/L 99(L) 100(L) 101  CO2 22 - 32 mmol/L 30 34(H) 29  Calcium 8.9 - 10.3 mg/dL 8.9 8.6(L) 8.0(L)  Total Protein 6.0 - 8.3 g/dL - - -  Total Bilirubin 0.2 - 1.2 mg/dL - - -  Alkaline Phos 39 - 117 U/L - - -  AST 0 - 37 U/L - - -  ALT 0 - 35 U/L - - -      RADIOGRAPHIC STUDIES: I have personally reviewed the radiological images as listed and agreed with the findings in the report. No results found.   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   1. 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 Bone health 3. Smoking cessation 4. COPD, motor-neuron disease, dysphagia  PLAN  No problem-specific Assessment & Plan notes found for this encounter.   No orders of the defined types were placed in this encounter.  All questions were answered. The patient knows to call the clinic with any problems, questions or concerns. No barriers to learning was detected. I spent {CHL ONC TIME VISIT - WGNFA:[id number]} counseling the patient face to face. The total time spent in the appointment was {CHL ONC TIME VISIT - ONGEX:[id number]} and more than 50% was on counseling and review of test results     Alla Feeling, NP 11/11/17

## 2017-11-11 ENCOUNTER — Ambulatory Visit: Payer: Medicare HMO | Admitting: Nurse Practitioner

## 2017-11-11 ENCOUNTER — Other Ambulatory Visit: Payer: Medicare HMO

## 2017-11-12 ENCOUNTER — Telehealth: Payer: Self-pay | Admitting: Hematology

## 2017-11-12 ENCOUNTER — Telehealth: Payer: Self-pay | Admitting: Family

## 2017-11-12 DIAGNOSIS — Z439 Encounter for attention to unspecified artificial opening: Secondary | ICD-10-CM | POA: Diagnosis not present

## 2017-11-12 DIAGNOSIS — M6281 Muscle weakness (generalized): Secondary | ICD-10-CM | POA: Diagnosis not present

## 2017-11-12 DIAGNOSIS — J449 Chronic obstructive pulmonary disease, unspecified: Secondary | ICD-10-CM | POA: Diagnosis not present

## 2017-11-12 DIAGNOSIS — Z9981 Dependence on supplemental oxygen: Secondary | ICD-10-CM | POA: Diagnosis not present

## 2017-11-12 DIAGNOSIS — S90822D Blister (nonthermal), left foot, subsequent encounter: Secondary | ICD-10-CM | POA: Diagnosis not present

## 2017-11-12 DIAGNOSIS — I10 Essential (primary) hypertension: Secondary | ICD-10-CM | POA: Diagnosis not present

## 2017-11-12 DIAGNOSIS — K5710 Diverticulosis of small intestine without perforation or abscess without bleeding: Secondary | ICD-10-CM | POA: Diagnosis not present

## 2017-11-12 DIAGNOSIS — C50512 Malignant neoplasm of lower-outer quadrant of left female breast: Secondary | ICD-10-CM | POA: Diagnosis not present

## 2017-11-12 DIAGNOSIS — Z9012 Acquired absence of left breast and nipple: Secondary | ICD-10-CM | POA: Diagnosis not present

## 2017-11-12 NOTE — Telephone Encounter (Signed)
Scheduled appt per 1/15 sch msg - patient wanted to coordinate aptps with her appt with Dr. Sondra Come due to transportation issues

## 2017-11-12 NOTE — Telephone Encounter (Signed)
Copied from [place] [id number]. Topic: General - Other >> Nov 12, 2017  9:53 AM Carolyn Stare wrote:  Langley Gauss with [facility] home health went to visit with pt today and she call to report the following  bp 100/50 episodes with diarr , losartan has been discontinued and she is continuing to take hctz 25mg  daily should she hold hctz when she has diarr   would like a call back pt is seeing [facility] 11/13/17   [phone]

## 2017-11-12 NOTE — Telephone Encounter (Signed)
We can discuss at OV tomorrow. Can she please bring all of her medications and any blood pressure readings she has recorded with her to OV?

## 2017-11-12 NOTE — Telephone Encounter (Signed)
LVM with [facility] and spoke with pt to let her know response as well. Pt understood.

## 2017-11-12 NOTE — Telephone Encounter (Signed)
Please advise 

## 2017-11-13 ENCOUNTER — Ambulatory Visit (INDEPENDENT_AMBULATORY_CARE_PROVIDER_SITE_OTHER): Payer: Medicare HMO | Admitting: Nurse Practitioner

## 2017-11-13 ENCOUNTER — Encounter: Payer: Self-pay | Admitting: Nurse Practitioner

## 2017-11-13 VITALS — BP 138/64 | HR 69 | Temp 98.1°F | Resp 18 | Ht 66.0 in | Wt 100.0 lb

## 2017-11-13 DIAGNOSIS — I10 Essential (primary) hypertension: Secondary | ICD-10-CM | POA: Diagnosis not present

## 2017-11-13 DIAGNOSIS — R6889 Other general symptoms and signs: Secondary | ICD-10-CM | POA: Diagnosis not present

## 2017-11-13 DIAGNOSIS — Z9012 Acquired absence of left breast and nipple: Secondary | ICD-10-CM | POA: Diagnosis not present

## 2017-11-13 DIAGNOSIS — E039 Hypothyroidism, unspecified: Secondary | ICD-10-CM

## 2017-11-13 DIAGNOSIS — C50512 Malignant neoplasm of lower-outer quadrant of left female breast: Secondary | ICD-10-CM | POA: Diagnosis not present

## 2017-11-13 DIAGNOSIS — E782 Mixed hyperlipidemia: Secondary | ICD-10-CM

## 2017-11-13 DIAGNOSIS — S90822D Blister (nonthermal), left foot, subsequent encounter: Secondary | ICD-10-CM | POA: Diagnosis not present

## 2017-11-13 DIAGNOSIS — Z439 Encounter for attention to unspecified artificial opening: Secondary | ICD-10-CM | POA: Diagnosis not present

## 2017-11-13 DIAGNOSIS — J449 Chronic obstructive pulmonary disease, unspecified: Secondary | ICD-10-CM | POA: Diagnosis not present

## 2017-11-13 DIAGNOSIS — K5710 Diverticulosis of small intestine without perforation or abscess without bleeding: Secondary | ICD-10-CM | POA: Diagnosis not present

## 2017-11-13 DIAGNOSIS — Z9981 Dependence on supplemental oxygen: Secondary | ICD-10-CM | POA: Diagnosis not present

## 2017-11-13 DIAGNOSIS — M6281 Muscle weakness (generalized): Secondary | ICD-10-CM | POA: Diagnosis not present

## 2017-11-13 NOTE — Progress Notes (Signed)
Name: Kristin [name]   MRN: [id number]    DOB: 21-Feb-1946   Date:11/13/2017       Progress Note  Subjective  Chief Complaint  Chief Complaint  Patient presents with  . Follow-up    blood pressure    HPI She presents to establish care and for a follow up of hypertension, hypothyroidism and hyperlipidemia. She is followed by pulmonology for severe COPD and pulmonary nodule maintained on 3L oxygen via [place]; oncology for malignant neoplasm of left breast, gastroenterology for dysphagia, weight loss, and altered bowel habits.  Hypertension -maintained on losartan 25 and HCTZ 25 daily. Her dosage of losartan was decreased down to 25 daily on her last visit to the clinic (12/19) due to low blood pressure readings. She actually completely stopped the losartan and has continued the HCTZ 25 daily. She has not taken it this morning because she was not feeling well. She is recovering from left mastectomy in November followed by a short-term stay in SNF for physical deconditioning. Reports her therapist has been checking her blood pressures at home with mostly normal readings, occasionally a low reading. She is unable to provide numbers for the readings. Denies syncope, dizziness, headaches, vision changes, chest pain.  BP Readings from Last 3 Encounters:  11/13/17 138/64  11/07/17 122/60  10/16/17 (!) 110/50   Cholesterol- maintained on crestor 5. Reports daily medication compliance without adverse medication effects including myalgias.  Lab Results  Component Value Date   CHOL 132 05/31/2017   HDL 76.30 05/31/2017   LDLCALC 43 05/31/2017   TRIG 63.0 05/31/2017   CHOLHDL 2 05/31/2017   Hypothryoidism-maintained on synthroid 26mcg daily. She says she tolerates the medication well with no adverse effects. Denies hair loss, palpitations, intolerance to heat or cold.  Lab Results  Component Value Date   TSH 1.227 09/17/2017     Patient Active Problem List   Diagnosis Date Noted  .  Acute respiratory failure ([place]) 09/26/2017  . Protein-calorie malnutrition ([place]) 09/26/2017  . Anxiety 09/26/2017  . Neuromuscular disorder ([place])   . S/P left mastectomy 09/16/2017  . Breast cancer of lower-outer quadrant of left female breast ([place]) 09/08/2017  . Encounter for general adult medical examination with abnormal findings 05/31/2017  . Medicare annual wellness visit, initial 05/31/2017  . Pulmonary nodule 04/02/2017  . Upper abdominal pain 08/09/2016  . SOB (shortness of breath) 08/09/2016  . Upper respiratory infection 12/13/2015  . Generalized abdominal pain 01/28/2015  . Abdominal bloating 01/28/2015  . Neck pain 03/29/2014  . Smoker 03/20/2013  . EARLY SATIETY 12/19/2010  . DYSPHAGIA UNSPECIFIED 12/19/2010  . Dysphagia, unspecified 12/19/2010  . FLATULENCE-GAS-BLOATING 12/19/2010  . PERIPHERAL NEUROPATHY 02/21/2010  . FOOT PAIN, BILATERAL 02/21/2010  . WEIGHT LOSS, ABNORMAL 12/22/2009  . Type 2 diabetes mellitus, controlled ([place]) 11/11/2009  . HYPERLIPIDEMIA, MIXED 08/19/2009  . Allergic rhinitis 08/19/2009  . VITILIGO 05/18/2009  . DEGENERATIVE DISC DISEASE, CERVICAL SPINE 01/16/2009  . NECK PAIN, LEFT 01/06/2009  . DYSPHAGIA PHARYNGEAL PHASE 11/18/2008  . ABDOMINAL PAIN-GENERALIZED 06/18/2008  . UNSPECIFIED VITAMIN D DEFICIENCY 05/26/2008  . GERD 05/26/2008  . PUD, HX OF 05/26/2008  . SINUSITIS, ACUTE 03/24/2008  . MYALGIA 02/09/2008  . FATIGUE 02/09/2008  . CHEST PAIN UNSPECIFIED 02/09/2008  . COPD GOLD D 05/22/2007  . BRONCHITIS, OBSTRUCTIVE CHRONIC W/EXACRB 04/24/2007  . Hypothyroidism 03/17/2007  . OCULAR MIGRAINE 03/17/2007  . Essential hypertension 03/17/2007    Past Surgical History:  Procedure Laterality Date  . ABDOMINAL HYSTERECTOMY W/ PARTIAL VAGINACTOMY    .  BREAST BIOPSY Right   . BREAST EXCISIONAL BIOPSY Right 1998  . COLONOSCOPY  05/19/2012  . ESOPHAGOGASTRODUODENOSCOPY  01/03/2011   normal   . MASTECTOMY W/ SENTINEL NODE BIOPSY  Left 09/16/2017   Procedure: LEFT MASTECTOMY WITH SENTINEL LYMPH NODE BIOPSY;  Surgeon: Jovita Kussmaul, MD;  Location: [facility];  Service: General;  Laterality: Left;  Marland Kitchen MULTIPLE TOOTH EXTRACTIONS    . THYROIDECTOMY, PARTIAL    . TONSILLECTOMY      Family History  Problem Relation Age of Onset  . Diabetes Mother        everyone on both sides  . Hypertension Mother        everyone on both sides  . Heart attack Mother   . Heart attack Maternal Grandmother   . Diabetes Maternal Grandmother   . Hypertension Maternal Grandmother   . Diabetes Maternal Grandfather   . Hypertension Maternal Grandfather   . Diabetes Paternal Grandmother   . Hypertension Paternal Grandmother   . Diabetes Paternal Grandfather   . Hypertension Paternal Grandfather   . Cancer Cousin 50       father's first cousin; breast and ovarian  . Colon cancer Neg Hx   . Stomach cancer Neg Hx   . Esophageal cancer Neg Hx   . Rectal cancer Neg Hx     Social History   Socioeconomic History  . Marital status: Divorced    Spouse name: Not on file  . Number of children: 3  . Years of education: 58+  . Highest education level: Doctorate  Social Needs  . Financial resource strain: Not on file  . Food insecurity - worry: Not on file  . Food insecurity - inability: Not on file  . Transportation needs - medical: Not on file  . Transportation needs - non-medical: Not on file  Occupational History  . 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 Smoking status: Former Smoker    Packs/day: 0.25    Years: 53.00    Pack years: 13.25    Types: Cigarettes  . Smokeless tobacco: Never Used  Substance and Sexual Activity  . Alcohol use: No    Alcohol/week: 0.0 oz  . Drug use: No  . Sexual activity: No    Partners: Male  Other Topics Concern  . Not on file  Social History Narrative   She lives alone.  She moved to [place] in 1998.  She has  three grown children (2 sons, 1 daughter).  One son lives in [place].   Retired Retail banker   Highest level of education:  PhD in [place], [place] in community agency counselling   Denies any religious beliefs effecting health care.      Current Outpatient Medications:  .  albuterol (PROVENTIL) (2.5 MG/3ML) 0.083% nebulizer solution, Take 3 mLs (2.5 mg total) by nebulization every 6 (six) hours as needed for wheezing or shortness of breath., Disp: 75 mL, Rfl: 0 .  hydrochlorothiazide (HYDRODIURIL) 25 MG tablet, Take 1 tablet by mouth once a day., Disp: 90 tablet, Rfl: 0 .  HYDROcodone-acetaminophen (NORCO/VICODIN) 5-325 MG tablet, Take 1 tablet by mouth every 6 (six) hours as needed for moderate pain or severe pain., Disp: 30 tablet, Rfl: 0 .  ipratropium-albuterol (DUONEB) 0.5-2.5 (3) MG/3ML SOLN, Take 3 mLs by nebulization every 6 (six) hours as needed., Disp: , Rfl:  .  losartan (COZAAR) 25 MG tablet, Take 1 tablet (  25 mg total) by mouth daily., Disp: 90 tablet, Rfl: 0 .  nizatidine (AXID) 15 MG/ML SOLN, Take 150 mg by mouth., Disp: , Rfl:  .  OXYGEN, Inhale 3 L into the lungs., Disp: , Rfl:  .  rosuvastatin (CRESTOR) 5 MG tablet, Take 1 tablet by mouth daily., Disp: 90 tablet, Rfl: 0 .  SYNTHROID 88 MCG tablet, Take 1 tablet (88 mcg total) by mouth daily before breakfast., Disp: 90 tablet, Rfl: 0 .  Tiotropium Bromide-Olodaterol (STIOLTO RESPIMAT) 2.5-2.5 MCG/ACT AERS, Inhale 2 puffs into the lungs daily., Disp: , Rfl:   Allergies  Allergen Reactions  . Symbicort [Budesonide-Formoterol Fumarate] Anaphylaxis and Other (See Comments)    Throat swelling     ROS See HPI  Objective  Vitals:   11/13/17 1112  BP: 138/64  Pulse: 69  Resp: 18  Temp: 98.1 F (36.7 C)  TempSrc: Oral  SpO2: 91%  Weight: 100 lb (45.4 kg)  Height: 5\' 6"  (1.676 m)    Body mass index is 16.14 kg/m.  Physical Exam Constitutional: Patient appears malnourished and chronically ill. ambulatory  with walker HENT: Head: Normocephalic and atraumatic. Nose: Nose normal. Mouth/Throat: Oropharynx is clear and moist. No oropharyngeal exudate.  Eyes: Conjunctivae and EOM are normal. No scleral icterus.  Neck: Normal range of motion. Neck supple. No thyromegaly present.  Cardiovascular: Normal rate, regular rhythm and normal heart sounds.  No murmur heard. No BLE edema. Pulmonary/Chest: Diminished breath sounds. 3L [place] continuously. Abdominal: Soft, no distension. Musculoskeletal: Normal range of motion, no joint effusions. No gross deformities Neurological: she is alert and oriented to person, place, and time. Coordination, balance, speech are normal. she is generally weak. Skin: Skin is warm and dry. No rash noted. No erythema.  Psychiatric: Patient has a normal mood and affect. behavior is normal. Judgment and thought content normal.  PHQ2/9: Depression screen PHQ 2/9 09/25/2017  Decreased Interest 0  Down, Depressed, Hopeless 1  PHQ - 2 Score 1    Fall Risk: Fall Risk  09/25/2017 08/12/2017 03/26/2017  Falls in the past year? No No No    Assessment & Plan RTC in 1 month for routine follow up- will recheck BP and follow up on diabetes

## 2017-11-13 NOTE — Assessment & Plan Note (Signed)
BP slightly elevated today but below goal. She did not take her blood pressure medication today. We discussed the importance of taking medication daily as scheduled. She was given BP parameters to help her monitor her BP at home We will continue HCTZ 25 for now and continue to hold losartan, as that is how she has been taking her medication at home for past month She requests no lab work today. She will follow up in about 1 month, we will see how her BP is doing.

## 2017-11-13 NOTE — Assessment & Plan Note (Signed)
CHolesterol panel stable on 05/31/17. She requests no lab work today. Continue current dosage of rosuvastatin.

## 2017-11-13 NOTE — Assessment & Plan Note (Signed)
Thyroid panel stable on 09/17/17. She requests no lab work today Will continue current dosage of sythroid

## 2017-11-13 NOTE — Patient Instructions (Addendum)
Please continue your HCTZ 25 mg once daily.  Our goal is to keep your blood pressure below 140/90. Please let me know if you get readings lower than 110/70.  Id like to see you back in about 1 month, or sooner if you need me.  It was nice to meet you. Thanks for letting me take care of you today :)

## 2017-11-14 ENCOUNTER — Telehealth: Payer: Self-pay | Admitting: Nurse Practitioner

## 2017-11-14 ENCOUNTER — Inpatient Hospital Stay: Admission: RE | Admit: 2017-11-14 | Payer: Medicare HMO | Source: Ambulatory Visit

## 2017-11-14 DIAGNOSIS — M6281 Muscle weakness (generalized): Secondary | ICD-10-CM | POA: Diagnosis not present

## 2017-11-14 DIAGNOSIS — K5710 Diverticulosis of small intestine without perforation or abscess without bleeding: Secondary | ICD-10-CM | POA: Diagnosis not present

## 2017-11-14 DIAGNOSIS — J449 Chronic obstructive pulmonary disease, unspecified: Secondary | ICD-10-CM | POA: Diagnosis not present

## 2017-11-14 DIAGNOSIS — Z439 Encounter for attention to unspecified artificial opening: Secondary | ICD-10-CM | POA: Diagnosis not present

## 2017-11-14 DIAGNOSIS — Z9981 Dependence on supplemental oxygen: Secondary | ICD-10-CM | POA: Diagnosis not present

## 2017-11-14 DIAGNOSIS — Z9012 Acquired absence of left breast and nipple: Secondary | ICD-10-CM | POA: Diagnosis not present

## 2017-11-14 DIAGNOSIS — I10 Essential (primary) hypertension: Secondary | ICD-10-CM | POA: Diagnosis not present

## 2017-11-14 DIAGNOSIS — S90822D Blister (nonthermal), left foot, subsequent encounter: Secondary | ICD-10-CM | POA: Diagnosis not present

## 2017-11-14 DIAGNOSIS — C50512 Malignant neoplasm of lower-outer quadrant of left female breast: Secondary | ICD-10-CM | POA: Diagnosis not present

## 2017-11-14 NOTE — Telephone Encounter (Signed)
Copied from [place] [phone]. Topic: Inquiry >> Nov 14, 2017 11:01 AM Scherrie Gerlach wrote: Reason for LKT:[facility]BWLS with [facility] home health called for the pt. Pt was sent home with a oxygen tank from the office yesterday 1/16 after visit with ashleigh. Debbie with [facility] states pt has no way to return it to the office and requesting a call from you to when you can come and pick it up.  Pt was told to make sure it gets returned. Please call pt on her home number

## 2017-11-18 ENCOUNTER — Ambulatory Visit (INDEPENDENT_AMBULATORY_CARE_PROVIDER_SITE_OTHER)
Admission: RE | Admit: 2017-11-18 | Discharge: 2017-11-18 | Disposition: A | Discharge status: Home / Self Care | Payer: Medicare HMO | Source: Ambulatory Visit | Attending: Emergency Medicine | Admitting: Emergency Medicine

## 2017-11-18 DIAGNOSIS — C50919 Malignant neoplasm of unspecified site of unspecified female breast: Secondary | ICD-10-CM | POA: Insufficient documentation

## 2017-11-18 DIAGNOSIS — M542 Cervicalgia: Secondary | ICD-10-CM

## 2017-11-18 DIAGNOSIS — R6889 Other general symptoms and signs: Secondary | ICD-10-CM | POA: Diagnosis not present

## 2017-11-18 DIAGNOSIS — R911 Solitary pulmonary nodule: Secondary | ICD-10-CM

## 2017-11-19 DIAGNOSIS — Z9981 Dependence on supplemental oxygen: Secondary | ICD-10-CM | POA: Diagnosis not present

## 2017-11-19 DIAGNOSIS — Z9012 Acquired absence of left breast and nipple: Secondary | ICD-10-CM | POA: Diagnosis not present

## 2017-11-19 DIAGNOSIS — K5710 Diverticulosis of small intestine without perforation or abscess without bleeding: Secondary | ICD-10-CM | POA: Diagnosis not present

## 2017-11-19 DIAGNOSIS — M6281 Muscle weakness (generalized): Secondary | ICD-10-CM | POA: Diagnosis not present

## 2017-11-19 DIAGNOSIS — Z439 Encounter for attention to unspecified artificial opening: Secondary | ICD-10-CM | POA: Diagnosis not present

## 2017-11-19 DIAGNOSIS — I10 Essential (primary) hypertension: Secondary | ICD-10-CM | POA: Diagnosis not present

## 2017-11-19 DIAGNOSIS — S90822D Blister (nonthermal), left foot, subsequent encounter: Secondary | ICD-10-CM | POA: Diagnosis not present

## 2017-11-19 DIAGNOSIS — C50512 Malignant neoplasm of lower-outer quadrant of left female breast: Secondary | ICD-10-CM | POA: Diagnosis not present

## 2017-11-19 DIAGNOSIS — J449 Chronic obstructive pulmonary disease, unspecified: Secondary | ICD-10-CM | POA: Diagnosis not present

## 2017-11-19 NOTE — Telephone Encounter (Signed)
[facility] is aware that [facility] the [facility] when they pick up or bring her other tanks.

## 2017-11-20 DIAGNOSIS — R6889 Other general symptoms and signs: Secondary | ICD-10-CM | POA: Diagnosis not present

## 2017-11-25 ENCOUNTER — Other Ambulatory Visit: Payer: Medicare HMO

## 2017-11-25 ENCOUNTER — Ambulatory Visit: Payer: Medicare HMO

## 2017-11-25 ENCOUNTER — Ambulatory Visit
Admission: RE | Admit: 2017-11-25 | Discharge: 2017-11-25 | Disposition: A | Discharge status: Home / Self Care | Payer: Medicare HMO | Source: Ambulatory Visit | Attending: Radiation Oncology | Admitting: Radiation Oncology

## 2017-11-25 ENCOUNTER — Ambulatory Visit: Payer: Medicare HMO | Admitting: Hematology

## 2017-11-26 ENCOUNTER — Telehealth: Payer: Self-pay | Admitting: Nurse Practitioner

## 2017-11-26 ENCOUNTER — Encounter: Payer: Self-pay | Admitting: Radiation Oncology

## 2017-11-26 DIAGNOSIS — I469 Cardiac arrest, cause unspecified: Secondary | ICD-10-CM | POA: Diagnosis not present

## 2017-11-27 ENCOUNTER — Ambulatory Visit: Payer: Medicare HMO | Admitting: Hematology

## 2017-11-28 ENCOUNTER — Ambulatory Visit: Payer: Medicare HMO | Admitting: Hematology

## 2017-11-28 ENCOUNTER — Telehealth: Payer: Self-pay

## 2017-11-28 ENCOUNTER — Other Ambulatory Visit: Payer: Medicare HMO

## 2017-11-28 NOTE — Telephone Encounter (Signed)
On 11/28/17 I received a d/c from [facility] (original). The d/c is for cremation. The patient is a patient of Doctor Byrum.   The d/c will be taken to Pulmonary Unit @ [facility] for signature.  On 12/03/17 I received the d/c back from Doctor Byrum. I got the d/c ready and called the funeral home to let them know the d/c is ready for pickup.

## 2017-11-29 NOTE — Telephone Encounter (Signed)
FYI EMS called stating that patient had passed away.  They will send death certificate over for signature.

## 2017-11-29 NOTE — Telephone Encounter (Signed)
Copied from [place]. Topic: Inquiry >> Nov 29, 2017 11:17 AM Corie Chiquito, NT wrote: Reason for CRM: Patients son calling because he needs for Kristin [name] to sign off on his mothers death certificate so they will be able to get arrangements together. If she could give him a call back at [phone]

## 2017-11-29 DEATH — deceased

## 2017-12-03 ENCOUNTER — Telehealth: Payer: Self-pay | Admitting: Emergency Medicine

## 2017-12-03 NOTE — Telephone Encounter (Signed)
[facility], [place] calling.  States Michelle/death certificates told her to call us about this.  Mickel Baas states per the [facility] Dept, unknown can be listed on the death certificate as cause of death,but list what patient has been seen for, can be within the last 365 days. Patient passed away on 12-09-2017 and waiting for cremation and family is asking this be done as soon as possible.

## 2017-12-03 NOTE — Telephone Encounter (Signed)
RB has the pt's death certificate in his possession.  Called and spoke with pt's son, Fannie Knee. He states that the pt was found deceased in her bed in her home. They do not know what her cause of death was. Fannie Knee was under the impression that the pt's PCP was going to be filling out the death certificate.  RB - do you want to defer this to the pt's PCP?

## 2017-12-03 NOTE — Telephone Encounter (Signed)
RB please advise on message below

## 2017-12-03 NOTE — Telephone Encounter (Signed)
This has been done.

## 2017-12-03 NOTE — Telephone Encounter (Signed)
We have not received death certificate. They sent it to pulmonologist to sign.

## 2017-12-04 ENCOUNTER — Ambulatory Visit: Payer: Medicare HMO | Admitting: Radiation Oncology

## 2017-12-04 ENCOUNTER — Ambulatory Visit: Payer: Medicare HMO

## 2017-12-14 ENCOUNTER — Other Ambulatory Visit: Payer: Self-pay | Admitting: Emergency Medicine

## 2017-12-16 ENCOUNTER — Ambulatory Visit: Payer: Medicare HMO | Admitting: Neurology

## 2018-01-13 ENCOUNTER — Ambulatory Visit: Payer: Medicare HMO | Admitting: Emergency Medicine

## 2018-08-19 IMAGING — DX DG CHEST 1V PORT
1 series · 1 of 1 positions shown · non-contrast
Comparison: Chest CT 04/05/2017

CLINICAL DATA: Intubation

EXAM:
PORTABLE CHEST 1 VIEW

[chest]
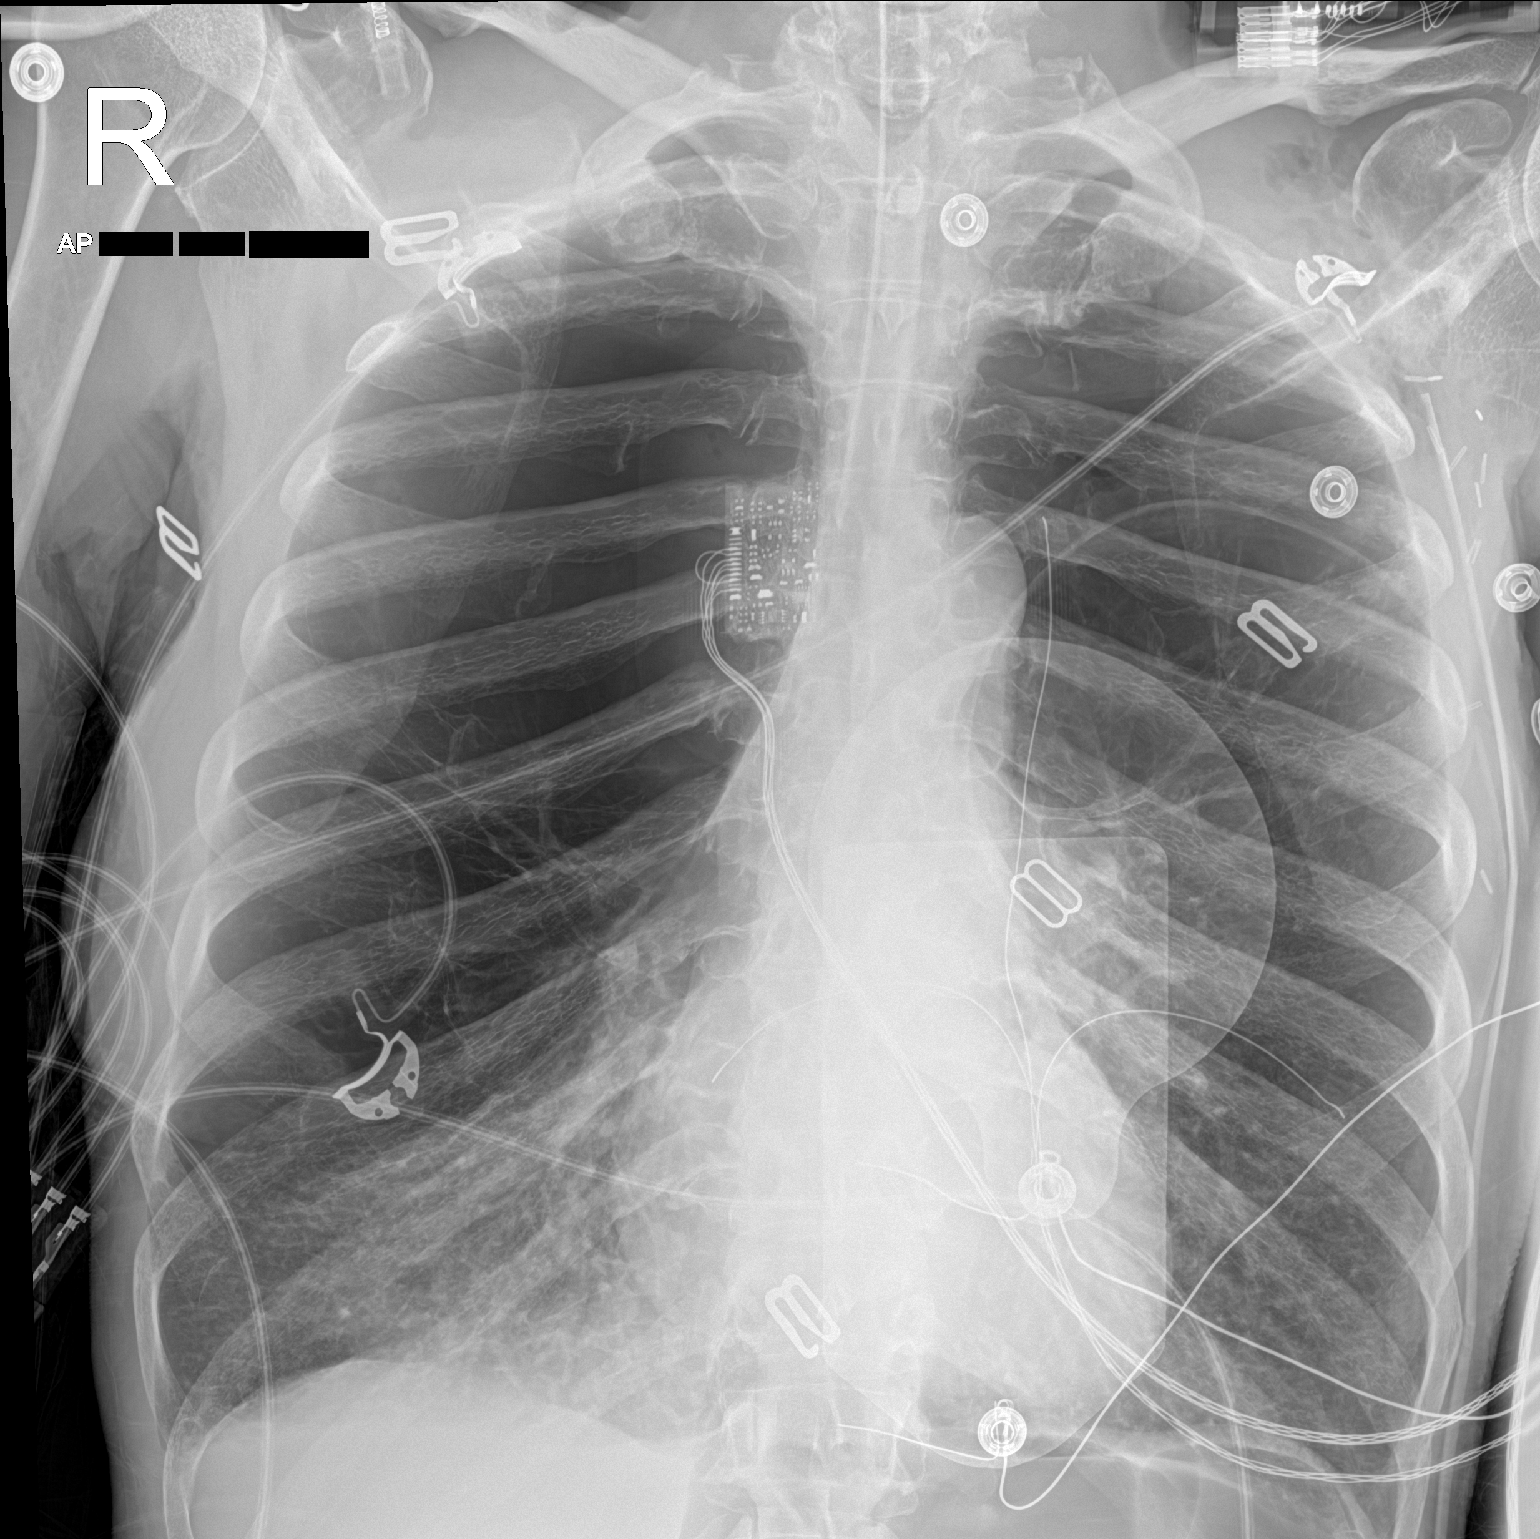

[1 of 1 positions shown; findings below may reference images not displayed]

FINDINGS: Large biapical bullae are again seen, as on the prior chest CT. The
endotracheal tube tip is proximally 6 cm above the level of the
carina. The lungs are hyperexpanded. Cardiomediastinal contours are
normal.
IMPRESSION: Endotracheal tube tip greater than 5 cm above the inferior carina.
COPD without acute airspace disease.

## 2018-08-20 IMAGING — DX DG CHEST 1V PORT
1 series · 1 of 1 positions shown · non-contrast
Comparison: 09/18/2017

CLINICAL DATA: Short of breath.  Ventilator dependent

EXAM:
PORTABLE CHEST 1 VIEW

[chest]
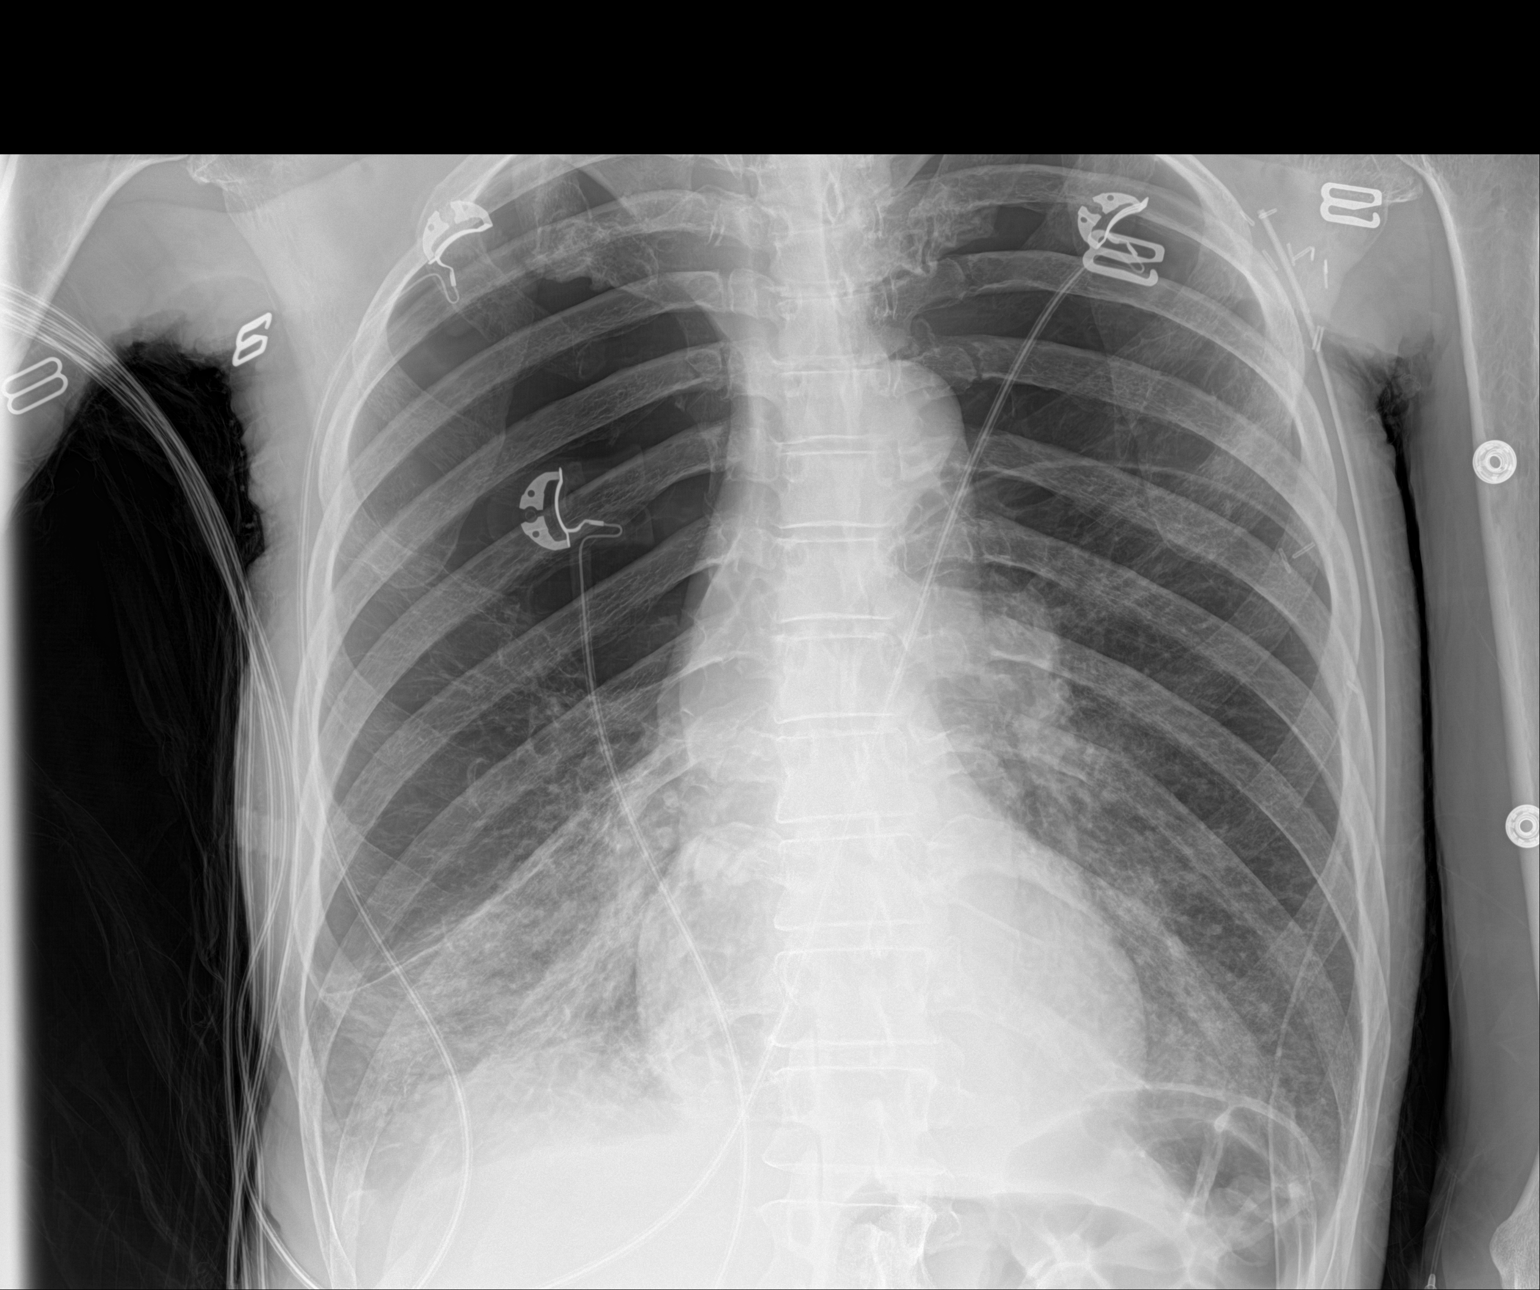

[1 of 1 positions shown; findings below may reference images not displayed]

FINDINGS: Endotracheal tube no longer visualized. New severe apical emphysema
unchanged. Bibasilar airspace disease right greater than left with
mild progression. Negative for effusion
IMPRESSION: Bibasilar airspace disease with mild progression. Endotracheal tube
no longer visualized.

## 2018-10-19 IMAGING — CT CT CHEST W/O CM
2 of 3 series · 15 of 36 positions shown, 18 images · non-contrast
Comparison: 04/05/2017

CLINICAL DATA: Follow-up lingular nodule

EXAM:
CT CHEST WITHOUT CONTRAST
TECHNIQUE: Multidetector CT imaging of the chest was performed following the
standard protocol without IV contrast.

[Series 2: thorax · axial · 0.66mm/px · z∈[+1056,+1324]mm · 12 of 158 slices shown, 15 images]
[im 12/158  mediastinal]
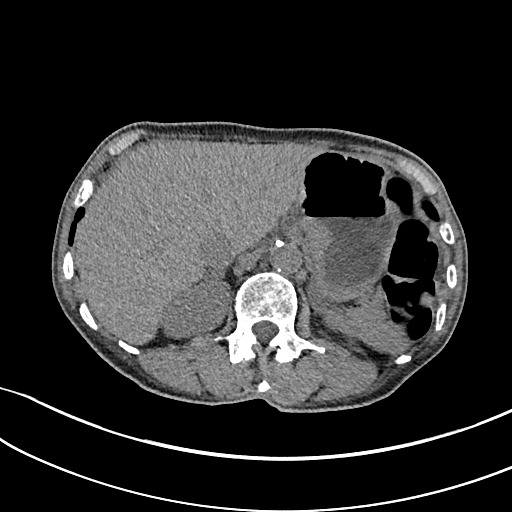
[im 12/158  lung]
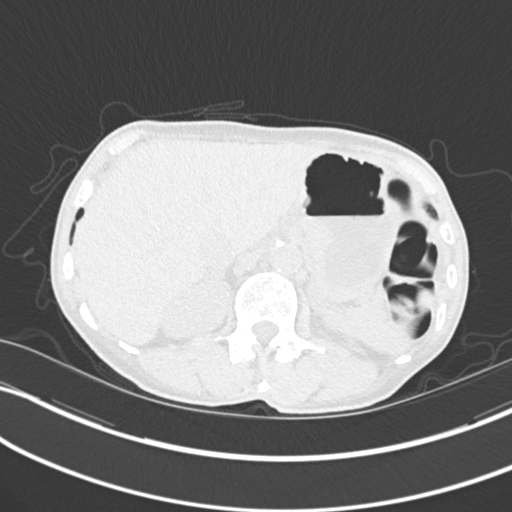
[im 24/158  lung]
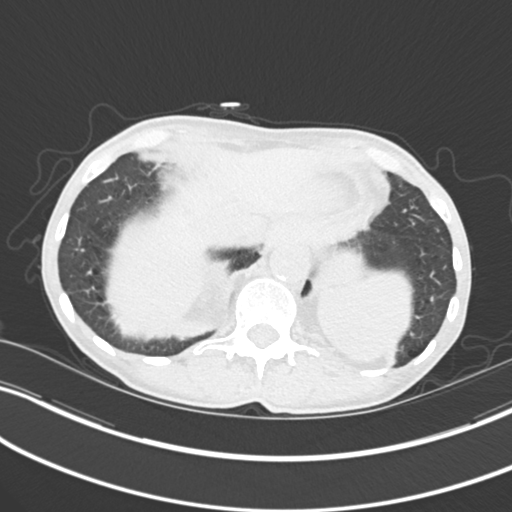
[im 35/158  lung]
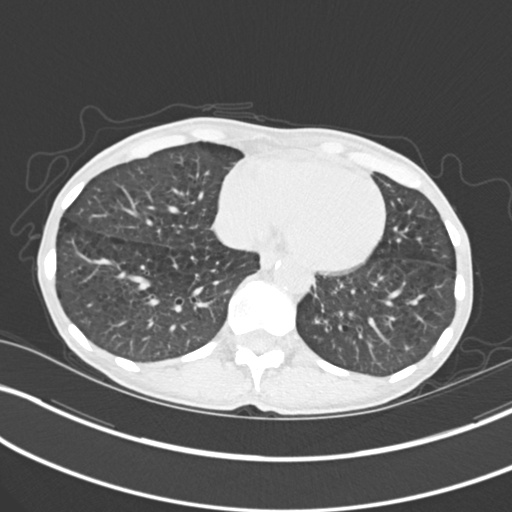
[im 47/158  lung]
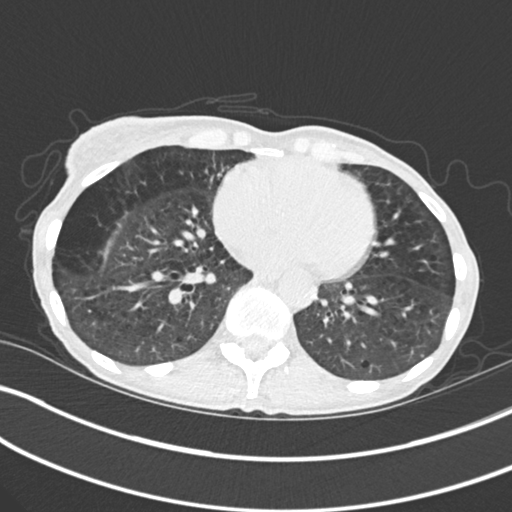
[im 59/158  mediastinal]
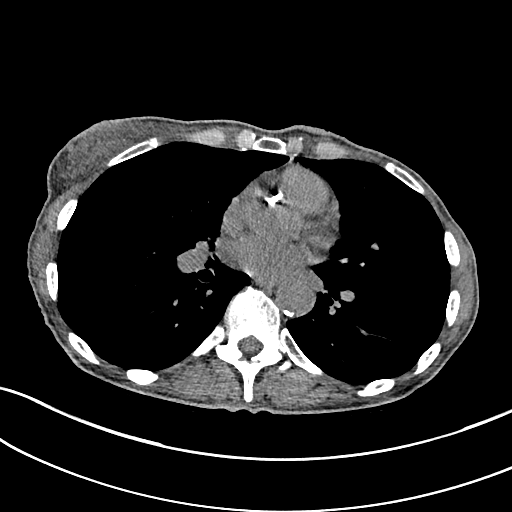
[im 59/158  lung]
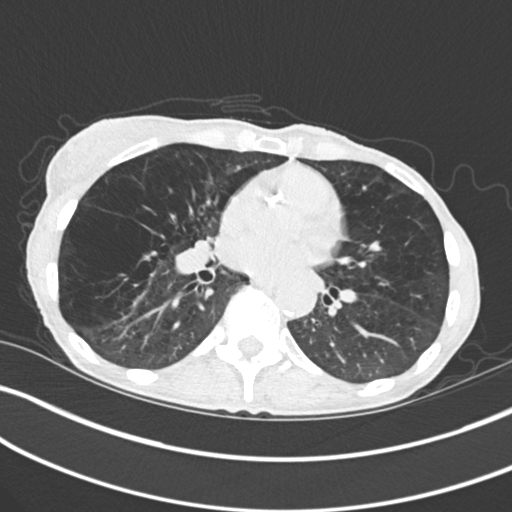
[im 70/158  lung]
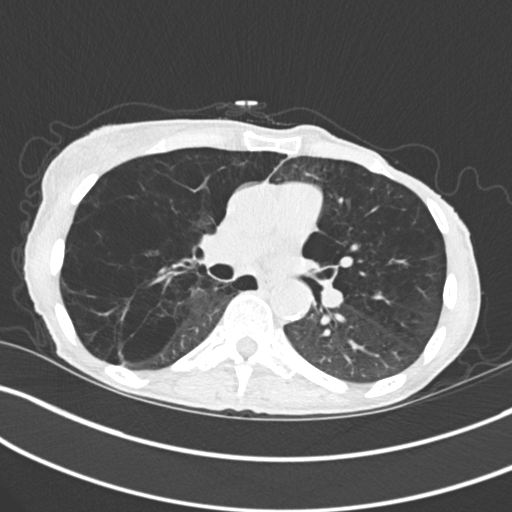
[im 88/158  lung]
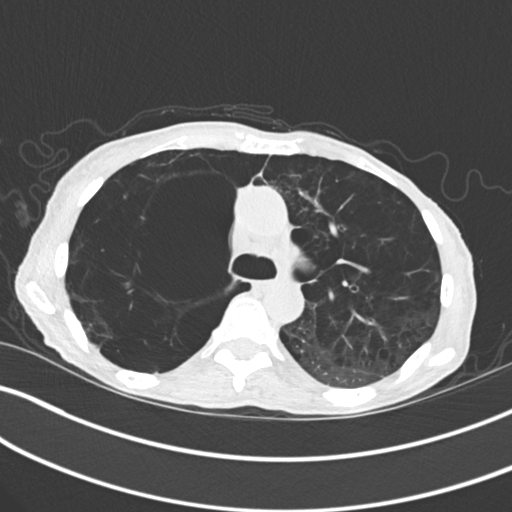
[im 99/158  lung]
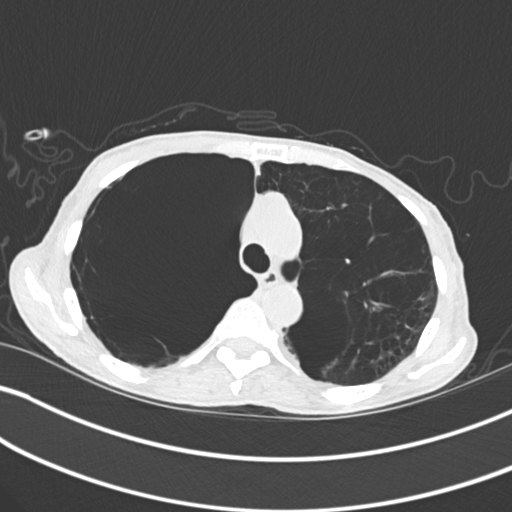
[im 111/158  mediastinal]
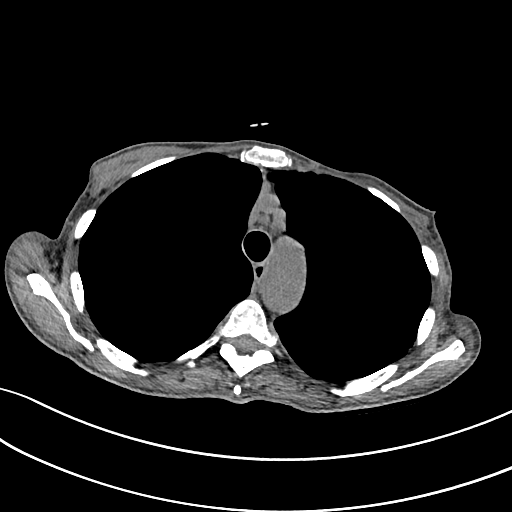
[im 111/158  lung]
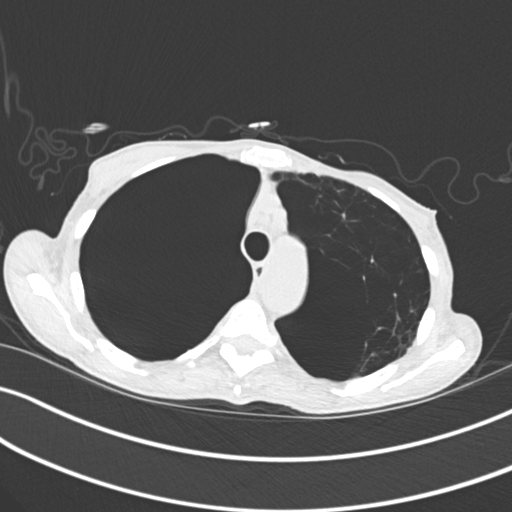
[im 123/158  lung]
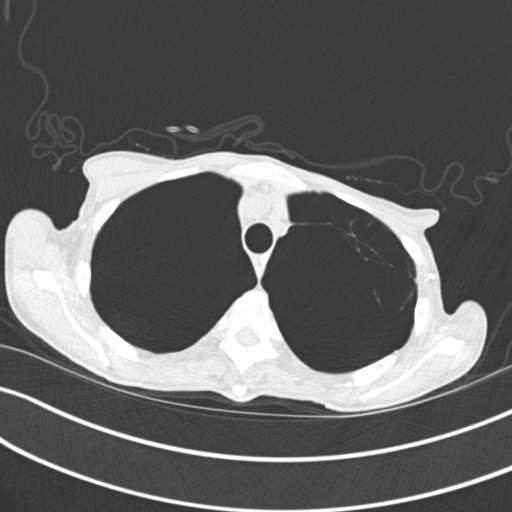
[im 134/158  lung]
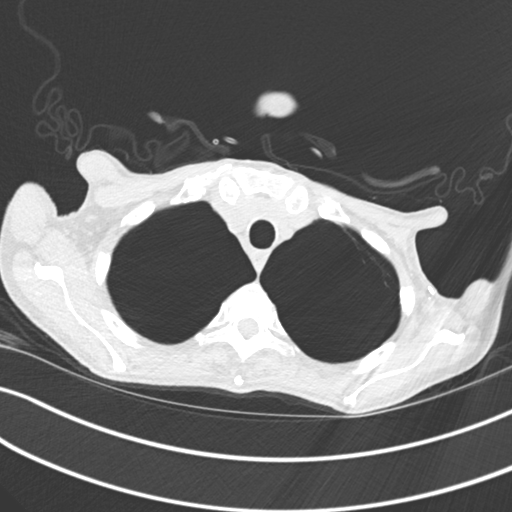
[im 146/158  lung]
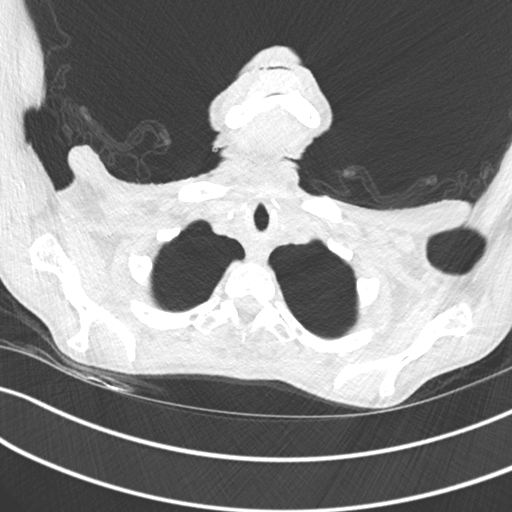

[Series 5: coronal · coronal · 0.62mm/px · 3 of 98 slices shown]
[im 20/98  lung]
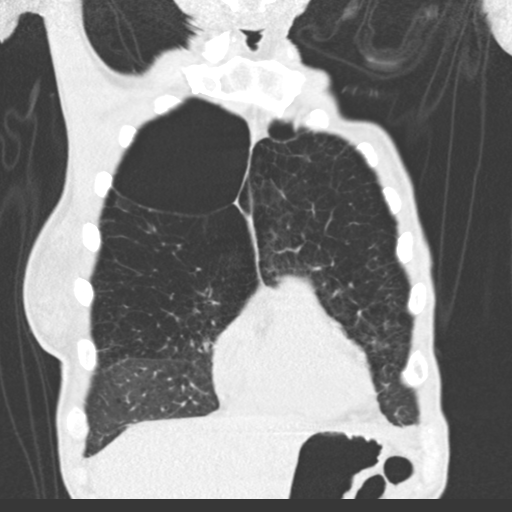
[im 39/98  lung]
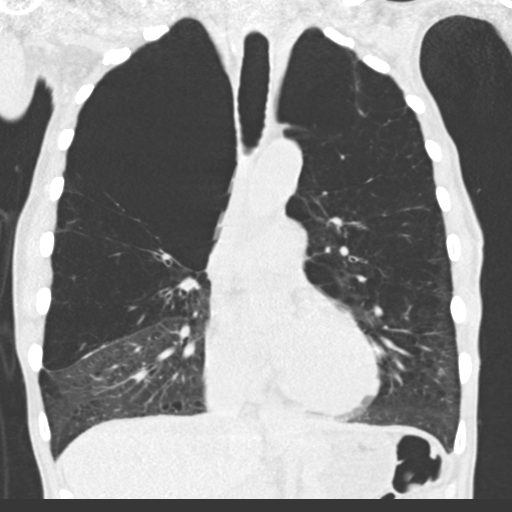
[im 59/98  lung]
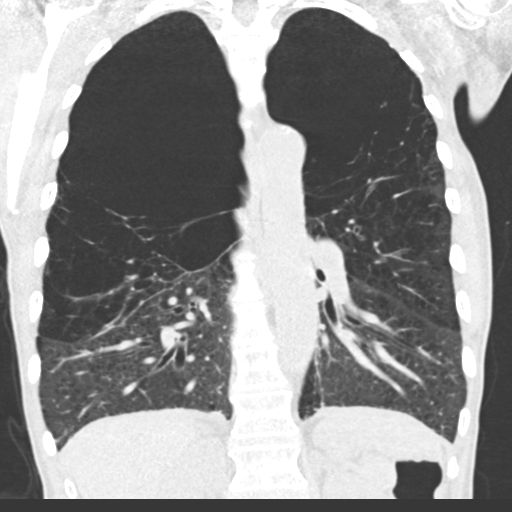

[15 of 36 positions shown; findings below may reference images not displayed]

FINDINGS: Cardiovascular: Atherosclerotic changes of the thoracic aorta are
noted. No significant cardiac enlargement is noted. Mild coronary
calcifications are seen.

Mediastinum/Nodes: No significant hilar or mediastinal adenopathy is
identified. The esophagus is within normal limits.

Lungs/Pleura: Significant bullous emphysema is noted in the upper
lobes. The lingular nodule seen on the prior exam is again
visualized but less solid in nature when compared with the prior
study. No new focal nodular changes are noted.

Upper Abdomen: Visualized upper abdomen demonstrates hypodensities
within the liver consistent with cysts stable from the prior exam.

Musculoskeletal: No acute bony abnormality is seen. Changes of prior
left mastectomy are noted.
IMPRESSION: Lingular nodule is again noted but somewhat less solid in nature
when compared with the prior exam. Follow-up examination in 12-18
months is considered optional for low-risk patients, but is
recommended for high-risk patients. This recommendation follows the
consensus statement: Guidelines for Management of Incidental
Pulmonary Nodules Detected on CT Images: From the [HOSPITAL]

Severe bullous emphysema

Aortic Atherosclerosis (N9EMT-ELM.M) and Emphysema (N9EMT-EA6.4).
